# Patient Record
Sex: Female | Born: 1970
Health system: Southern US, Community
[De-identification: ages and names within clinical notes are randomized; demographics above are authoritative.]

## PROBLEM LIST (undated history)

## (undated) DIAGNOSIS — F17200 Nicotine dependence, unspecified, uncomplicated: Secondary | ICD-10-CM

## (undated) DIAGNOSIS — Z6841 Body Mass Index (BMI) 40.0 and over, adult: Secondary | ICD-10-CM

## (undated) DIAGNOSIS — Z8719 Personal history of other diseases of the digestive system: Secondary | ICD-10-CM

## (undated) DIAGNOSIS — I639 Cerebral infarction, unspecified: Secondary | ICD-10-CM

## (undated) DIAGNOSIS — Z9861 Coronary angioplasty status: Secondary | ICD-10-CM

## (undated) DIAGNOSIS — I214 Non-ST elevation (NSTEMI) myocardial infarction: Secondary | ICD-10-CM

## (undated) DIAGNOSIS — M1990 Unspecified osteoarthritis, unspecified site: Secondary | ICD-10-CM

## (undated) DIAGNOSIS — G4733 Obstructive sleep apnea (adult) (pediatric): Secondary | ICD-10-CM

## (undated) DIAGNOSIS — I10 Essential (primary) hypertension: Secondary | ICD-10-CM

## (undated) DIAGNOSIS — K219 Gastro-esophageal reflux disease without esophagitis: Secondary | ICD-10-CM

## (undated) DIAGNOSIS — Z955 Presence of coronary angioplasty implant and graft: Secondary | ICD-10-CM

## (undated) DIAGNOSIS — J45909 Unspecified asthma, uncomplicated: Secondary | ICD-10-CM

## (undated) DIAGNOSIS — E119 Type 2 diabetes mellitus without complications: Secondary | ICD-10-CM

## (undated) DIAGNOSIS — I2510 Atherosclerotic heart disease of native coronary artery without angina pectoris: Secondary | ICD-10-CM

## (undated) DIAGNOSIS — Z9989 Dependence on other enabling machines and devices: Secondary | ICD-10-CM

## (undated) HISTORY — DX: Presence of coronary angioplasty implant and graft: Z95.5

## (undated) HISTORY — PX: OTHER SURGICAL HISTORY: SHX169

## (undated) HISTORY — DX: Obstructive sleep apnea (adult) (pediatric): G47.33

## (undated) HISTORY — DX: Body Mass Index (BMI) 40.0 and over, adult: Z684

## (undated) HISTORY — DX: Atherosclerotic heart disease of native coronary artery without angina pectoris: I25.10

## (undated) HISTORY — DX: Dependence on other enabling machines and devices: Z99.89

## (undated) HISTORY — DX: Morbid (severe) obesity due to excess calories: E66.01

## (undated) HISTORY — DX: Coronary angioplasty status: Z98.61

---

## 1998-03-13 ENCOUNTER — Ambulatory Visit (HOSPITAL_COMMUNITY): Admission: RE | Admit: 1998-03-13 | Discharge: 1998-03-13 | Payer: Self-pay

## 1998-09-30 ENCOUNTER — Encounter: Payer: Self-pay | Admitting: Orthopedic Surgery

## 1998-09-30 ENCOUNTER — Ambulatory Visit (HOSPITAL_COMMUNITY): Admission: RE | Admit: 1998-09-30 | Discharge: 1998-09-30 | Payer: Self-pay | Admitting: Orthopedic Surgery

## 2008-04-27 HISTORY — PX: CARDIAC CATHETERIZATION: SHX172

## 2009-01-24 ENCOUNTER — Ambulatory Visit (HOSPITAL_COMMUNITY): Admission: RE | Admit: 2009-01-24 | Discharge: 2009-01-24 | Payer: Self-pay | Admitting: Cardiovascular Disease

## 2010-08-01 LAB — GLUCOSE, CAPILLARY
Glucose-Capillary: 142 mg/dL — ABNORMAL HIGH (ref 70–99)
Glucose-Capillary: 156 mg/dL — ABNORMAL HIGH (ref 70–99)

## 2010-08-01 LAB — HCG, SERUM, QUALITATIVE: Preg, Serum: NEGATIVE

## 2010-09-24 ENCOUNTER — Emergency Department (INDEPENDENT_AMBULATORY_CARE_PROVIDER_SITE_OTHER): Payer: Medicare Other

## 2010-09-24 ENCOUNTER — Emergency Department (HOSPITAL_BASED_OUTPATIENT_CLINIC_OR_DEPARTMENT_OTHER)
Admission: EM | Admit: 2010-09-24 | Discharge: 2010-09-24 | Disposition: A | Discharge status: Home / Self Care | Payer: Medicare Other | Attending: Emergency Medicine | Admitting: Emergency Medicine

## 2010-09-24 DIAGNOSIS — I10 Essential (primary) hypertension: Secondary | ICD-10-CM | POA: Insufficient documentation

## 2010-09-24 DIAGNOSIS — Z79899 Other long term (current) drug therapy: Secondary | ICD-10-CM | POA: Insufficient documentation

## 2010-09-24 DIAGNOSIS — R05 Cough: Secondary | ICD-10-CM

## 2010-09-24 DIAGNOSIS — R52 Pain, unspecified: Secondary | ICD-10-CM

## 2010-09-24 DIAGNOSIS — I2510 Atherosclerotic heart disease of native coronary artery without angina pectoris: Secondary | ICD-10-CM | POA: Insufficient documentation

## 2010-09-24 DIAGNOSIS — J069 Acute upper respiratory infection, unspecified: Secondary | ICD-10-CM | POA: Insufficient documentation

## 2010-09-24 DIAGNOSIS — E7800 Pure hypercholesterolemia, unspecified: Secondary | ICD-10-CM | POA: Insufficient documentation

## 2010-09-24 DIAGNOSIS — E119 Type 2 diabetes mellitus without complications: Secondary | ICD-10-CM | POA: Insufficient documentation

## 2010-09-24 DIAGNOSIS — Z8679 Personal history of other diseases of the circulatory system: Secondary | ICD-10-CM | POA: Insufficient documentation

## 2011-11-25 IMAGING — CR DG CHEST 2V
2 series · 2 of 2 positions shown · non-contrast
Comparison: None.

CLINICAL DATA: Cough/body aches

CHEST - 2 VIEW

[w chest pa]
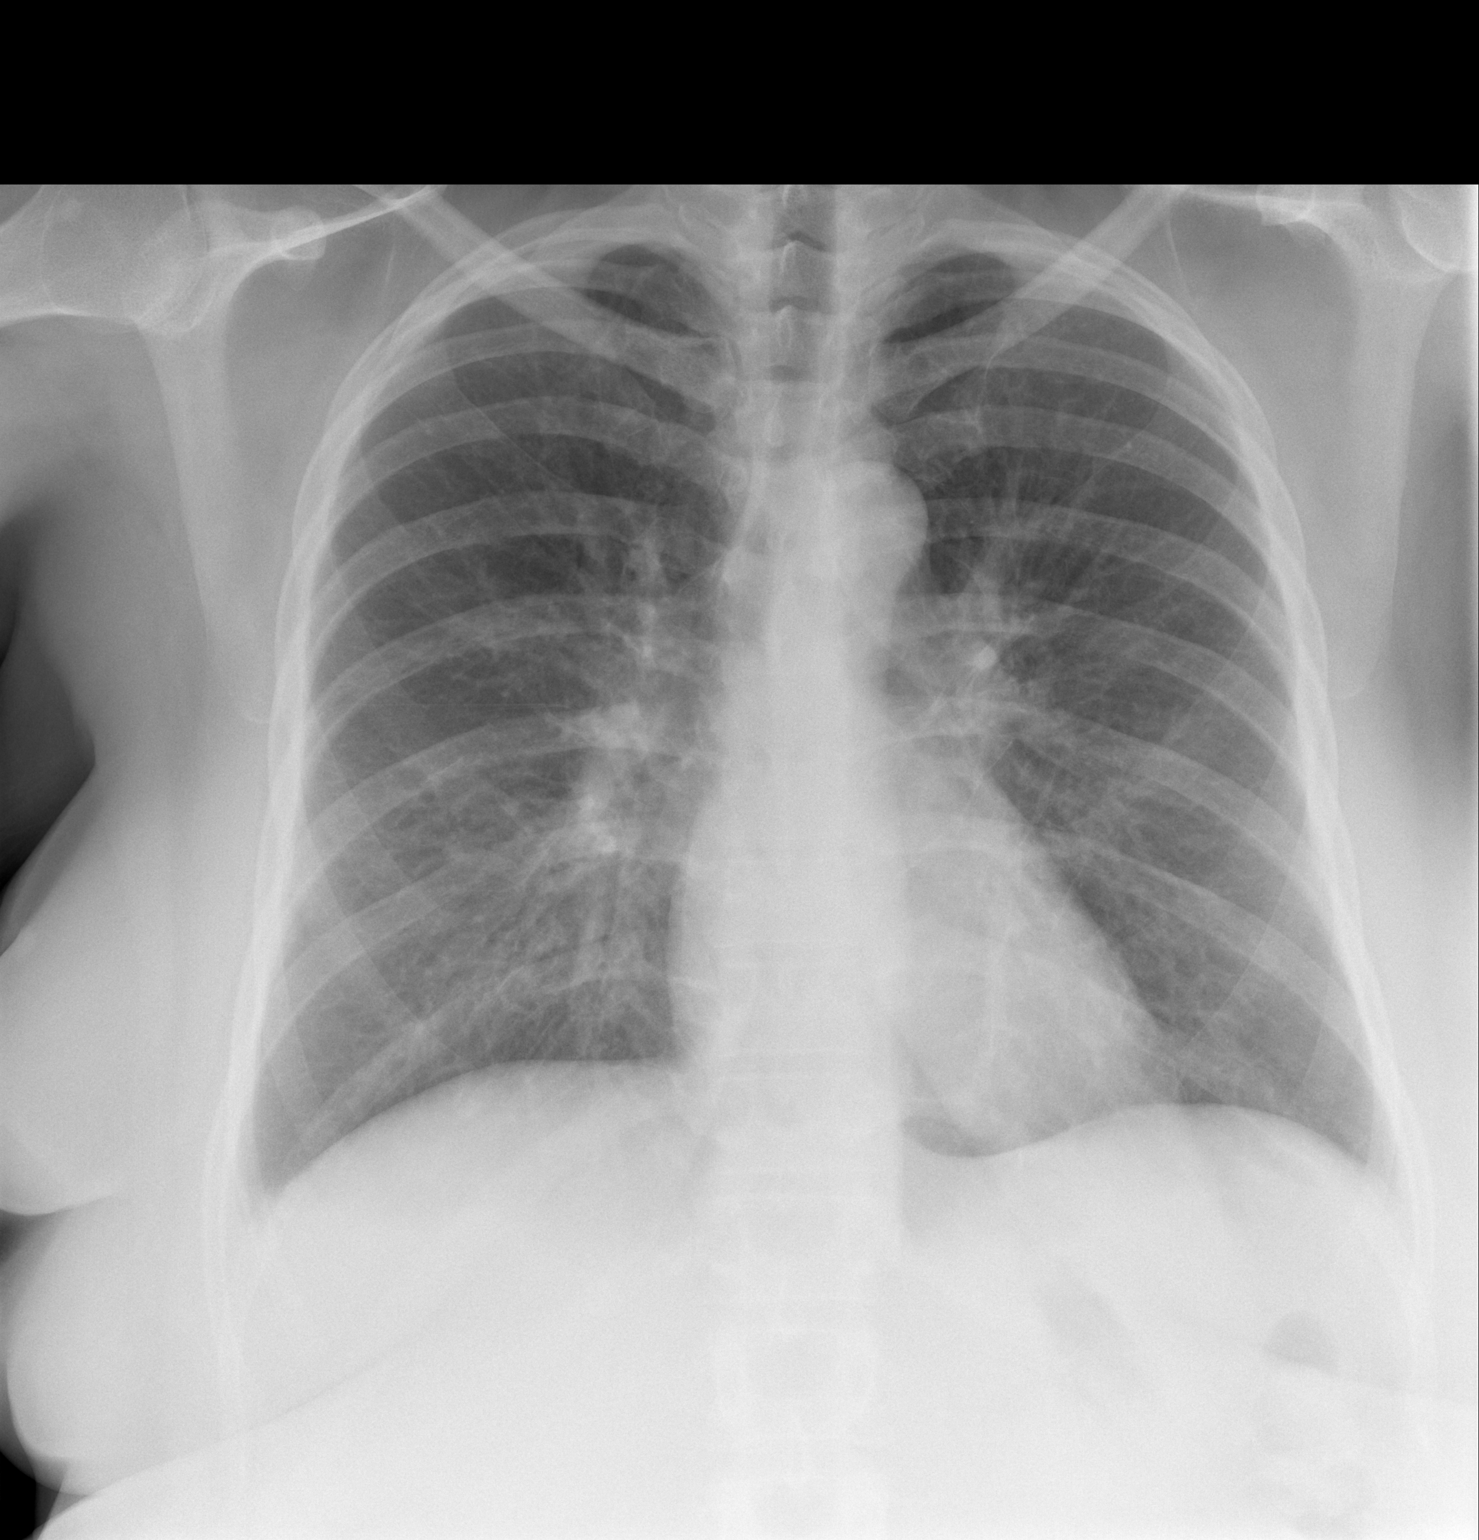

[w chest lat]
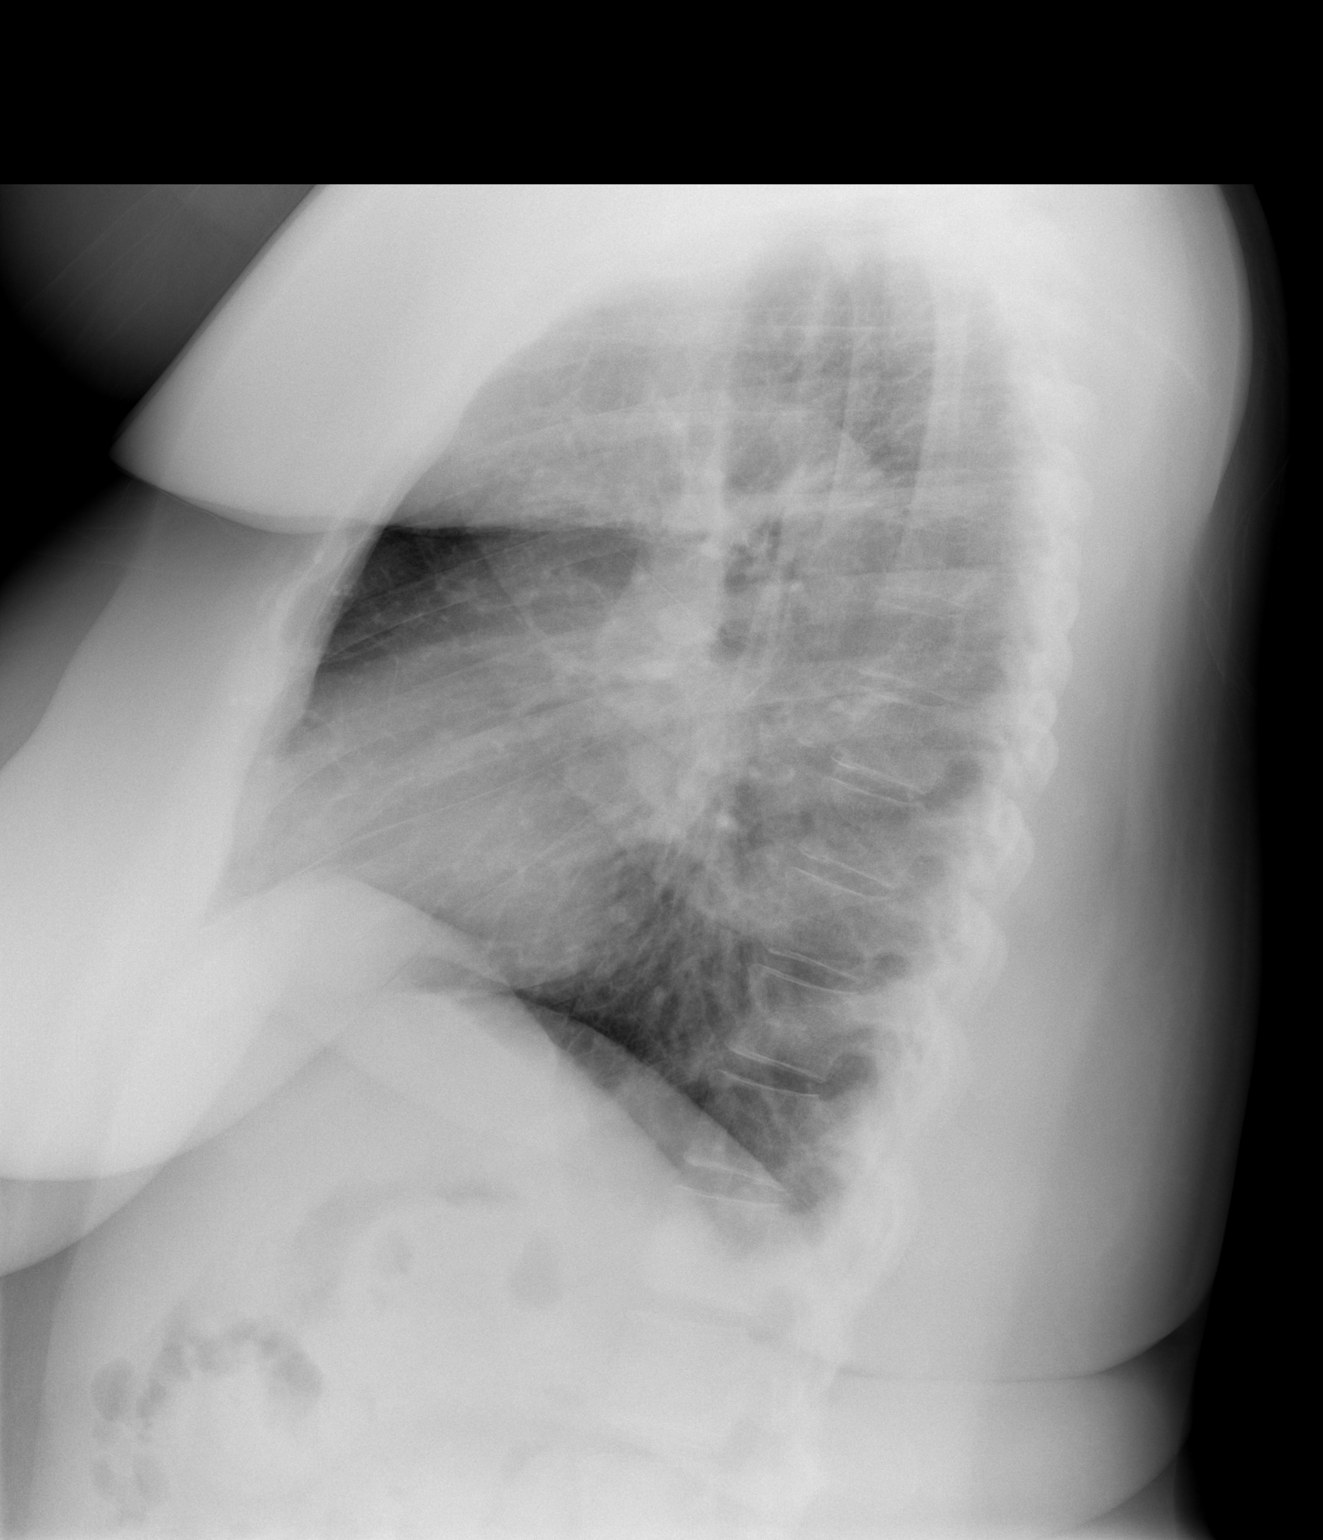

[2 of 2 positions shown; findings below may reference images not displayed]

FINDINGS: There is mild central airway thickening.  The lungs are
clear.  Heart and mediastinal contours normal.  No pleural fluid or
osseous lesions.
IMPRESSION: Chronic bronchitic type changes - no acute findings.

## 2012-06-25 DIAGNOSIS — I2510 Atherosclerotic heart disease of native coronary artery without angina pectoris: Secondary | ICD-10-CM

## 2012-06-25 HISTORY — DX: Atherosclerotic heart disease of native coronary artery without angina pectoris: I25.10

## 2012-07-04 ENCOUNTER — Encounter (HOSPITAL_COMMUNITY): Payer: Self-pay | Admitting: *Deleted

## 2012-07-04 ENCOUNTER — Emergency Department (HOSPITAL_COMMUNITY): Payer: Medicare Other

## 2012-07-04 ENCOUNTER — Inpatient Hospital Stay (HOSPITAL_COMMUNITY)
Admission: EM | Admit: 2012-07-04 | Discharge: 2012-07-07 | DRG: 281 | Disposition: A | Discharge status: Home / Self Care | Payer: Medicare Other | Attending: Cardiology | Admitting: Cardiology

## 2012-07-04 DIAGNOSIS — F17200 Nicotine dependence, unspecified, uncomplicated: Secondary | ICD-10-CM | POA: Diagnosis present

## 2012-07-04 DIAGNOSIS — I2510 Atherosclerotic heart disease of native coronary artery without angina pectoris: Secondary | ICD-10-CM | POA: Diagnosis present

## 2012-07-04 DIAGNOSIS — E876 Hypokalemia: Secondary | ICD-10-CM | POA: Diagnosis not present

## 2012-07-04 DIAGNOSIS — I10 Essential (primary) hypertension: Secondary | ICD-10-CM | POA: Diagnosis present

## 2012-07-04 DIAGNOSIS — E119 Type 2 diabetes mellitus without complications: Secondary | ICD-10-CM | POA: Diagnosis present

## 2012-07-04 DIAGNOSIS — Z6841 Body Mass Index (BMI) 40.0 and over, adult: Secondary | ICD-10-CM

## 2012-07-04 DIAGNOSIS — I214 Non-ST elevation (NSTEMI) myocardial infarction: Principal | ICD-10-CM | POA: Diagnosis present

## 2012-07-04 DIAGNOSIS — I249 Acute ischemic heart disease, unspecified: Secondary | ICD-10-CM

## 2012-07-04 DIAGNOSIS — Z794 Long term (current) use of insulin: Secondary | ICD-10-CM | POA: Diagnosis present

## 2012-07-04 DIAGNOSIS — Z8673 Personal history of transient ischemic attack (TIA), and cerebral infarction without residual deficits: Secondary | ICD-10-CM

## 2012-07-04 HISTORY — DX: Cerebral infarction, unspecified: I63.9

## 2012-07-04 HISTORY — DX: Nicotine dependence, unspecified, uncomplicated: F17.200

## 2012-07-04 LAB — POCT I-STAT TROPONIN I: Troponin i, poc: 0.8 ng/mL (ref 0.00–0.08)

## 2012-07-04 LAB — CBC WITH DIFFERENTIAL/PLATELET
Basophils Absolute: 0 10*3/uL (ref 0.0–0.1)
Basophils Relative: 0 % (ref 0–1)
Eosinophils Absolute: 0 10*3/uL (ref 0.0–0.7)
Eosinophils Relative: 0 % (ref 0–5)
HCT: 36.5 % (ref 36.0–46.0)
Hemoglobin: 12.7 g/dL (ref 12.0–15.0)
Lymphocytes Relative: 18 % (ref 12–46)
Lymphs Abs: 1.8 K/uL (ref 0.7–4.0)
MCH: 32.2 pg (ref 26.0–34.0)
MCHC: 34.8 g/dL (ref 30.0–36.0)
MCV: 92.6 fL (ref 78.0–100.0)
Monocytes Absolute: 0.8 K/uL (ref 0.1–1.0)
Monocytes Relative: 8 % (ref 3–12)
Neutro Abs: 7.3 10*3/uL (ref 1.7–7.7)
Neutrophils Relative %: 73 % (ref 43–77)
Platelets: 284 10*3/uL (ref 150–400)
RBC: 3.94 MIL/uL (ref 3.87–5.11)
RDW: 13.5 % (ref 11.5–15.5)
WBC: 9.9 K/uL (ref 4.0–10.5)

## 2012-07-04 LAB — COMPREHENSIVE METABOLIC PANEL
AST: 20 U/L (ref 0–37)
Albumin: 3.4 g/dL — ABNORMAL LOW (ref 3.5–5.2)
Alkaline Phosphatase: 115 U/L (ref 39–117)
BUN: 13 mg/dL (ref 6–23)
Chloride: 101 mEq/L (ref 96–112)
Potassium: 4.4 mEq/L (ref 3.5–5.1)
Sodium: 137 mEq/L (ref 135–145)
Total Protein: 7.1 g/dL (ref 6.0–8.3)

## 2012-07-04 LAB — PROTIME-INR
INR: 1.04 (ref 0.00–1.49)
Prothrombin Time: 13.5 s (ref 11.6–15.2)

## 2012-07-04 LAB — COMPREHENSIVE METABOLIC PANEL WITH GFR
ALT: 16 U/L (ref 0–35)
CO2: 24 meq/L (ref 19–32)
Calcium: 9 mg/dL (ref 8.4–10.5)
Creatinine, Ser: 0.96 mg/dL (ref 0.50–1.10)
GFR calc Af Amer: 84 mL/min — ABNORMAL LOW (ref >90)
GFR calc non Af Amer: 72 mL/min — ABNORMAL LOW (ref >90)
Glucose, Bld: 128 mg/dL — ABNORMAL HIGH (ref 70–99)
Total Bilirubin: 0.5 mg/dL (ref 0.3–1.2)

## 2012-07-04 LAB — GLUCOSE, CAPILLARY: Glucose-Capillary: 212 mg/dL — ABNORMAL HIGH (ref 70–99)

## 2012-07-04 LAB — TROPONIN I: Troponin I: 2.08 ng/mL (ref <0.30)

## 2012-07-04 LAB — LIPASE, BLOOD: Lipase: 28 U/L (ref 11–59)

## 2012-07-04 LAB — APTT: aPTT: 29 seconds (ref 24–37)

## 2012-07-04 MED ORDER — SODIUM CHLORIDE 0.9 % IV SOLN
Freq: Once | INTRAVENOUS | Status: AC
  Administered 2012-07-04: 75 mL/h via INTRAVENOUS

## 2012-07-04 MED ORDER — SODIUM CHLORIDE 0.9 % IV SOLN
250.0000 mL | INTRAVENOUS | Status: DC | PRN
  Administered 2012-07-05: 250 mL via INTRAVENOUS

## 2012-07-04 MED ORDER — ASPIRIN EC 81 MG PO TBEC
81.0000 mg | DELAYED_RELEASE_TABLET | Freq: Every day | ORAL | Status: DC
  Administered 2012-07-06 – 2012-07-07 (×2): 81 mg via ORAL
  Filled 2012-07-04 (×3): qty 1

## 2012-07-04 MED ORDER — ASPIRIN 81 MG PO CHEW
162.0000 mg | CHEWABLE_TABLET | Freq: Once | ORAL | Status: AC
  Administered 2012-07-04: 162 mg via ORAL
  Filled 2012-07-04: qty 2

## 2012-07-04 MED ORDER — ASPIRIN 81 MG PO TABS: 81.0000 mg | ORAL_TABLET | Freq: Every day | ORAL | Status: DC

## 2012-07-04 MED ORDER — SODIUM CHLORIDE 0.9 % IV SOLN: INTRAVENOUS | Status: AC

## 2012-07-04 MED ORDER — NITROGLYCERIN 0.4 MG SL SUBL: 0.4000 mg | SUBLINGUAL_TABLET | SUBLINGUAL | Status: DC | PRN

## 2012-07-04 MED ORDER — CYCLOBENZAPRINE HCL 10 MG PO TABS
10.0000 mg | ORAL_TABLET | Freq: Three times a day (TID) | ORAL | Status: DC | PRN
  Administered 2012-07-05 (×2): 10 mg via ORAL
  Filled 2012-07-04 (×2): qty 1

## 2012-07-04 MED ORDER — ASPIRIN-DIPYRIDAMOLE ER 25-200 MG PO CP12
1.0000 | ORAL_CAPSULE | Freq: Every day | ORAL | Status: DC
  Administered 2012-07-05 – 2012-07-07 (×3): 1 via ORAL
  Filled 2012-07-04 (×3): qty 1

## 2012-07-04 MED ORDER — EPINEPHRINE 0.3 MG/0.3ML IJ DEVI
0.3000 mg | INTRAMUSCULAR | Status: DC | PRN
  Filled 2012-07-04: qty 0.6

## 2012-07-04 MED ORDER — ASPIRIN 81 MG PO CHEW
81.0000 mg | CHEWABLE_TABLET | Freq: Every day | ORAL | Status: DC
  Administered 2012-07-06 (×2): 81 mg via ORAL
  Filled 2012-07-04: qty 1

## 2012-07-04 MED ORDER — EZETIMIBE 10 MG PO TABS
10.0000 mg | ORAL_TABLET | Freq: Every day | ORAL | Status: DC
  Administered 2012-07-05 – 2012-07-07 (×3): 10 mg via ORAL
  Filled 2012-07-04 (×3): qty 1

## 2012-07-04 MED ORDER — FUROSEMIDE 40 MG PO TABS
40.0000 mg | ORAL_TABLET | Freq: Every day | ORAL | Status: DC
  Administered 2012-07-06 – 2012-07-07 (×2): 40 mg via ORAL
  Filled 2012-07-04 (×3): qty 1

## 2012-07-04 MED ORDER — SODIUM CHLORIDE 0.9 % IJ SOLN: 3.0000 mL | INTRAMUSCULAR | Status: DC | PRN

## 2012-07-04 MED ORDER — EPINEPHRINE 0.3 MG/0.3ML IJ DEVI: 0.3000 mg | Freq: Once | INTRAMUSCULAR | Status: DC

## 2012-07-04 MED ORDER — SODIUM CHLORIDE 0.9 % IJ SOLN
3.0000 mL | Freq: Two times a day (BID) | INTRAMUSCULAR | Status: DC
  Administered 2012-07-04 – 2012-07-05 (×2): 3 mL via INTRAVENOUS

## 2012-07-04 MED ORDER — NITROGLYCERIN 0.4 MG/SPRAY TL SOLN: 1.0000 | Status: DC | PRN

## 2012-07-04 MED ORDER — LISINOPRIL 40 MG PO TABS
40.0000 mg | ORAL_TABLET | Freq: Every day | ORAL | Status: DC
  Administered 2012-07-06 – 2012-07-07 (×2): 40 mg via ORAL
  Filled 2012-07-04 (×3): qty 1

## 2012-07-04 MED ORDER — HEPARIN BOLUS VIA INFUSION
4000.0000 [IU] | Freq: Once | INTRAVENOUS | Status: AC
  Administered 2012-07-04: 4000 [IU] via INTRAVENOUS

## 2012-07-04 MED ORDER — HEPARIN (PORCINE) IN NACL 100-0.45 UNIT/ML-% IJ SOLN
1550.0000 [IU]/h | INTRAMUSCULAR | Status: DC
  Administered 2012-07-04: 1250 [IU]/h via INTRAVENOUS
  Filled 2012-07-04 (×3): qty 250

## 2012-07-04 MED ORDER — ALPRAZOLAM 0.25 MG PO TABS
0.2500 mg | ORAL_TABLET | Freq: Two times a day (BID) | ORAL | Status: DC | PRN
  Administered 2012-07-05: 0.25 mg via ORAL
  Filled 2012-07-04: qty 1

## 2012-07-04 MED ORDER — DARIFENACIN HYDROBROMIDE ER 7.5 MG PO TB24
7.5000 mg | ORAL_TABLET | Freq: Every day | ORAL | Status: DC
  Administered 2012-07-05 – 2012-07-07 (×3): 7.5 mg via ORAL
  Filled 2012-07-04 (×3): qty 1

## 2012-07-04 MED ORDER — ONDANSETRON HCL 4 MG/2ML IJ SOLN: 4.0000 mg | Freq: Four times a day (QID) | INTRAMUSCULAR | Status: DC | PRN

## 2012-07-04 MED ORDER — TRAMADOL HCL 50 MG PO TABS
50.0000 mg | ORAL_TABLET | Freq: Four times a day (QID) | ORAL | Status: DC | PRN
  Administered 2012-07-05 – 2012-07-06 (×2): 50 mg via ORAL
  Filled 2012-07-04 (×3): qty 1

## 2012-07-04 MED ORDER — ACETAMINOPHEN 325 MG PO TABS
650.0000 mg | ORAL_TABLET | ORAL | Status: DC | PRN
  Administered 2012-07-06: 650 mg via ORAL
  Filled 2012-07-04: qty 2

## 2012-07-04 MED ORDER — ASPIRIN 81 MG PO CHEW
324.0000 mg | CHEWABLE_TABLET | ORAL | Status: AC
  Administered 2012-07-05: 324 mg via ORAL
  Filled 2012-07-04: qty 4

## 2012-07-04 MED ORDER — ALBUTEROL SULFATE HFA 108 (90 BASE) MCG/ACT IN AERS: 2.0000 | INHALATION_SPRAY | Freq: Four times a day (QID) | RESPIRATORY_TRACT | Status: DC | PRN

## 2012-07-04 MED ORDER — METOPROLOL TARTRATE 50 MG PO TABS
50.0000 mg | ORAL_TABLET | Freq: Two times a day (BID) | ORAL | Status: DC
  Administered 2012-07-05 – 2012-07-07 (×6): 50 mg via ORAL
  Filled 2012-07-04 (×7): qty 1

## 2012-07-04 MED ORDER — ATORVASTATIN CALCIUM 40 MG PO TABS
40.0000 mg | ORAL_TABLET | Freq: Every day | ORAL | Status: DC
  Administered 2012-07-05 – 2012-07-06 (×2): 40 mg via ORAL
  Filled 2012-07-04 (×3): qty 1

## 2012-07-04 MED ORDER — ZOLPIDEM TARTRATE 5 MG PO TABS: 5.0000 mg | ORAL_TABLET | Freq: Every evening | ORAL | Status: DC | PRN

## 2012-07-04 NOTE — ED Notes (Signed)
Pt in from PCP@ [facility], pt c/o epigastric pain radiating to R shoulder, pt was getting pupils dilated for eval at eye MD & was in the waiting room & started to have N/V x 5 vomiting episodes, pt c/o diaphoresis with episode, pt then went to PCP & was sent here for further eval, pt received x 1 nitro @ PCP office with relief, no abnormal EKG findings in route, pt hx of Stroke with stated R hand deficits at baseline, pt A&O x4, follows commands, speaks in complete sentences

## 2012-07-04 NOTE — ED Provider Notes (Addendum)
History     CSN: [id number]  Arrival date & time 07/04/12  1258   First MD Initiated Contact with Patient 07/04/12 1313      No chief complaint on file.   (Consider location/radiation/quality/duration/timing/severity/associated sxs/prior treatment) HPI Comments: Patient is brought by EMS from her primary care physician, Dr. Julio Sicks.  Patient reports that she was in her usual state of health, was at her eye doctor for her yearly evaluation. She did not eaten breakfast, had taken off her usual morning medications. She had her eyes dilated and were waiting for them to dilate to see her physician. She suddenly became nauseated without any pain and had to go the restroom and reported several episodes of dry heaving. Following this she began having some chest pressure and discomfort that radiated to her right shoulder. She became diaphoretic, denies shortness of breath or feeling syncopal. She was sent over to her primary care physician and received a single sublingual nitroglycerin and reported significant improvement of her symptoms. EMS transported her without any significant incident. She has a prior history of coronary disease. In review of prior records, it appears that she had a heart cath in September, 2010 which revealed a nearly occluded proximal right coronary artery. She developed several collaterals and because she was not having active pain, decision was made at the time to treat her medically. She does have a history of hypercholesterolemia, hypertension, DM. She also has a former history of stroke. She currently is on Aggrenox. She also takes a baby aspirin. She has not yet had a aspirin today. She also reports that she has a history of hiatal hernia.    The history is provided by the patient, medical records and the EMS personnel.    No past medical history on file.  No past surgical history on file.  No family history on file.  History  Substance Use Topics  . Smoking status:  Not on file  . Smokeless tobacco: Not on file  . Alcohol Use: Not on file    OB History   No data available      Review of Systems  Constitutional: Positive for diaphoresis. Negative for fever and chills.  Eyes: Negative for visual disturbance.  Respiratory: Positive for chest tightness. Negative for shortness of breath.   Cardiovascular: Positive for chest pain. Negative for palpitations and leg swelling.  Gastrointestinal: Positive for nausea and vomiting. Negative for abdominal pain.  Genitourinary: Negative for flank pain.  Musculoskeletal: Positive for back pain.  Neurological: Negative for dizziness, seizures, syncope, speech difficulty and light-headedness.  All other systems reviewed and are negative.    Allergies  Ciprofloxacin  Home Medications   Current Outpatient Rx  Name  Route  Sig  Dispense  Refill  . albuterol (PROVENTIL HFA;VENTOLIN HFA) 108 (90 BASE) MCG/ACT inhaler   Inhalation   Inhale 2 puffs into the lungs every 6 (six) hours as needed for wheezing.         Marland Kitchen aspirin 81 MG tablet   Oral   Take 81 mg by mouth daily.         Marland Kitchen atorvastatin (LIPITOR) 40 MG tablet   Oral   Take 40 mg by mouth daily.         . cyclobenzaprine (FLEXERIL) 10 MG tablet   Oral   Take 10 mg by mouth 3 (three) times daily as needed for muscle spasms.         Marland Kitchen dipyridamole-aspirin (AGGRENOX) 200-25 MG per 12 hr capsule  Oral   Take 1 capsule by mouth daily.         Marland Kitchen EPINEPHrine (EPIPEN) 0.3 mg/0.3 mL DEVI   Intramuscular   Inject 0.3 mg into the muscle once.         . ezetimibe (ZETIA) 10 MG tablet   Oral   Take 10 mg by mouth daily.         . furosemide (LASIX) 40 MG tablet   Oral   Take 40 mg by mouth daily.         Marland Kitchen lisinopril (PRINIVIL,ZESTRIL) 40 MG tablet   Oral   Take 40 mg by mouth daily.         . metoprolol (LOPRESSOR) 50 MG tablet   Oral   Take 50 mg by mouth 2 (two) times daily.         . nitroGLYCERIN (NITROLINGUAL)  0.4 MG/SPRAY spray   Sublingual   Place 1 spray under the tongue every 5 (five) minutes as needed for chest pain.         . sitaGLIPtan-metformin (JANUMET) 50-500 MG per tablet   Oral   Take 1 tablet by mouth 2 (two) times daily with a meal.         . solifenacin (VESICARE) 10 MG tablet   Oral   Take 10 mg by mouth daily as needed.         . traMADol (ULTRAM) 50 MG tablet   Oral   Take 50 mg by mouth every 6 (six) hours as needed for pain.           BP 161/83  Pulse 71  Temp(Src) 98.1 F (36.7 C) (Oral)  Resp 27  SpO2 100%  Physical Exam  Nursing note and vitals reviewed. Constitutional: She is oriented to person, place, and time. She appears well-developed and well-nourished. No distress.  HENT:  Head: Normocephalic and atraumatic.  Eyes: Conjunctivae and EOM are normal. No scleral icterus.  Neck: Normal range of motion. Neck supple. No JVD present.  Cardiovascular: Normal rate and regular rhythm.   No murmur heard. Pulmonary/Chest: Effort normal. No respiratory distress. She has no wheezes. She has no rales.  Abdominal: Soft. She exhibits no distension. There is no tenderness. There is no rebound and no guarding.  Neurological: She is alert and oriented to person, place, and time. No cranial nerve deficit. Coordination normal.  Skin: Skin is warm and dry. No rash noted. She is not diaphoretic.  Psychiatric: She has a normal mood and affect.    ED Course  Procedures (including critical care time)  CRITICAL CARE Performed by: Lear Ng.   Total critical care time: 30 min  Critical care time was exclusive of separately billable procedures and treating other patients.  Critical care was necessary to treat or prevent imminent or life-threatening deterioration.  Critical care was time spent personally by me on the following activities: development of treatment plan with patient and/or surrogate as well as nursing, discussions with consultants, evaluation  of patient's response to treatment, examination of patient, obtaining history from patient or surrogate, ordering and performing treatments and interventions, ordering and review of laboratory studies, ordering and review of radiographic studies, pulse oximetry and re-evaluation of patient's condition.   Labs Reviewed  COMPREHENSIVE METABOLIC PANEL - Abnormal; Notable for the following:    Glucose, Bld 128 (*)    Albumin 3.4 (*)    GFR calc non Af Amer 72 (*)    GFR calc Af Amer 84 (*)  All other components within normal limits  POCT I-STAT TROPONIN I - Abnormal; Notable for the following:    Troponin i, poc 0.80 (*)    All other components within normal limits  CBC WITH DIFFERENTIAL  LIPASE, BLOOD  TROPONIN I  APTT  PROTIME-INR   Dg Chest 2 View  07/04/2012  *RADIOLOGY REPORT*  Clinical Data: Chest pain.  Hypertension.  CHEST - 2 VIEW  Comparison: 09/24/2010.  Findings: Normal sized heart.  Clear lungs with normal vascularity. Normal appearing bones.  IMPRESSION: Normal examination.   Original Report Authenticated By: Beckie Salts, M.D.      1. Acute coronary syndrome     Room air saturation is 100% I interpret this to be normal.   EKG at time 13:08, shows sinus rhythm at a rate of 70. Possible right atrial enlargement. Normal axis. No ST or T-wave abnormalities.  4:31 PM Pt's CP continues to be resolved after the 1 SL NTG.  Pt is updated that troponin is +, likely small NSTEMI.  Will start IV heparin and discuss with [facility] for admission.    MDM  Patient's symptoms are atypical for coronary syndrome given that she presented initially with nausea and vomiting followed by chest pressure discomfort. Pain seemed improved after nitroglycerin which would suggest possibly cardiac angina versus esophageal spasms. Given the pain occurred shortly after multiple episodes of dry heaving, I would favor more of an esophageal issue. However she certainly has significant cardiac risk factors with  DM and she is female meaning she may have atypical symptoms. Plan is to monitor the patient, round serial cardiac enzymes and discuss with her primary cardiologist with [facility] heart and vascular.  Aspirin given here.        Gavin Pound. Oletta Lamas, MD 07/04/12 2130  Gavin Pound. Oletta Lamas, MD 07/04/12 [phone]

## 2012-07-04 NOTE — Consult Note (Signed)
Reason for Consult:Chest Pain Referring Physician: Indios MD   HPI: The patient is a 42 y.o. female who is morbidly obese with a history of CAD, DM, stroke as well as hypertension. She has been followed by Dr. Herbie Baltimore.  She had a cath in 2010 that revealed a nearly complete blocked RCA, however she had adequate supply of collaterals and was treated medically. She last saw Dr.Harding in clinic on 04/19/2012 and at that time was doing well from a cardiovascular standpoint. She presented to the [facility] ED today with a complaint of substernal chest pressure. She has never had this type of pain before. She was at the eye doctor's office when she developed nausea with vomiting. She was diaphoretic and developed chest pressure which radiated to her right shoulder. She had mild SOB. The pain was non-pleuritic, non-positional, non-exertional and 6/10. She left the eye doctor and when to her PCP office, which was across the street.  There she was given SL NTG x 1 and had immediate relief. She denies any further chest pain or related symptoms. She denies recent orthopnea/PND, syncope/presyncope. She reports compliance with all of her medications.   No past medical history on file.  No past surgical history on file.  No family history on file.  Social History:  has no tobacco, alcohol, and drug history on file.  Allergies:  Allergies  Allergen Reactions  . Ciprofloxacin Hives and Swelling    Medications: Prior to Admission:  (Not in a hospital admission)  Results for orders placed during the hospital encounter of 07/04/12 (from the past 48 hour(s))  CBC WITH DIFFERENTIAL     Status: None   Collection Time    07/04/12  3:07 PM      Result Value Range   WBC 9.9  4.0 - 10.5 K/uL   RBC 3.94  3.87 - 5.11 MIL/uL   Hemoglobin 12.7  12.0 - 15.0 g/dL   HCT 86.5  78.4 - 69.6 %   MCV 92.6  78.0 - 100.0 fL   MCH 32.2  26.0 - 34.0 pg   MCHC 34.8  30.0 - 36.0 g/dL   RDW 29.5  28.4 - 13.2 %   Platelets 284   150 - 400 K/uL   Neutrophils Relative 73  43 - 77 %   Neutro Abs 7.3  1.7 - 7.7 K/uL   Lymphocytes Relative 18  12 - 46 %   Lymphs Abs 1.8  0.7 - 4.0 K/uL   Monocytes Relative 8  3 - 12 %   Monocytes Absolute 0.8  0.1 - 1.0 K/uL   Eosinophils Relative 0  0 - 5 %   Eosinophils Absolute 0.0  0.0 - 0.7 K/uL   Basophils Relative 0  0 - 1 %   Basophils Absolute 0.0  0.0 - 0.1 K/uL  COMPREHENSIVE METABOLIC PANEL     Status: Abnormal   Collection Time    07/04/12  3:07 PM      Result Value Range   Sodium 137  135 - 145 mEq/L   Potassium 4.4  3.5 - 5.1 mEq/L   Chloride 101  96 - 112 mEq/L   CO2 24  19 - 32 mEq/L   Glucose, Bld 128 (*) 70 - 99 mg/dL   BUN 13  6 - 23 mg/dL   Creatinine, Ser 4.40  0.50 - 1.10 mg/dL   Calcium 9.0  8.4 - 10.2 mg/dL   Total Protein 7.1  6.0 - 8.3 g/dL   Albumin  3.4 (*) 3.5 - 5.2 g/dL   AST 20  0 - 37 U/L   ALT 16  0 - 35 U/L   Alkaline Phosphatase 115  39 - 117 U/L   Total Bilirubin 0.5  0.3 - 1.2 mg/dL   GFR calc non Af Amer 72 (*) >90 mL/min   GFR calc Af Amer 84 (*) >90 mL/min   Comment:            The eGFR has been calculated     using the CKD EPI equation.     This calculation has not been     validated in all clinical     situations.     eGFR's persistently     <90 mL/min signify     possible Chronic Kidney Disease.  LIPASE, BLOOD     Status: None   Collection Time    07/04/12  3:07 PM      Result Value Range   Lipase 28  11 - 59 U/L  POCT I-STAT TROPONIN I     Status: Abnormal   Collection Time    07/04/12  3:45 PM      Result Value Range   Troponin i, poc 0.80 (*) 0.00 - 0.08 ng/mL   Comment NOTIFIED PHYSICIAN     Comment 3            Comment: Due to the release kinetics of cTnI,     a negative result within the first hours     of the onset of symptoms does not rule out     myocardial infarction with certainty.     If myocardial infarction is still suspected,     repeat the test at appropriate intervals.  TROPONIN I     Status:  Abnormal   Collection Time    07/04/12  4:01 PM      Result Value Range   Troponin I 2.08 (*) <0.30 ng/mL   Comment:            Due to the release kinetics of cTnI,     a negative result within the first hours     of the onset of symptoms does not rule out     myocardial infarction with certainty.     If myocardial infarction is still suspected,     repeat the test at appropriate intervals.     CRITICAL RESULT CALLED TO, READ BACK BY AND VERIFIED WITH:     W.SHEERWOOD RN 1705 07/04/12 E.GADDY  APTT     Status: None   Collection Time    07/04/12  4:20 PM      Result Value Range   aPTT 29  24 - 37 seconds  PROTIME-INR     Status: None   Collection Time    07/04/12  4:20 PM      Result Value Range   Prothrombin Time 13.5  11.6 - 15.2 seconds   INR 1.04  0.00 - 1.49    Dg Chest 2 View  07/04/2012  *RADIOLOGY REPORT*  Clinical Data: Chest pain.  Hypertension.  CHEST - 2 VIEW  Comparison: 09/24/2010.  Findings: Normal sized heart.  Clear lungs with normal vascularity. Normal appearing bones.  IMPRESSION: Normal examination.   Original Report Authenticated By: Beckie Salts, M.D.     Review of Systems  Constitutional: Positive for diaphoresis. Negative for malaise/fatigue.  HENT: Negative for congestion, sore throat and neck pain.   Respiratory: Positive for shortness of breath. Negative for cough,  sputum production and wheezing.   Cardiovascular: Positive for chest pain. Negative for palpitations, orthopnea, claudication, leg swelling and PND.  Gastrointestinal: Positive for nausea and vomiting. Negative for abdominal pain, diarrhea, constipation, blood in stool and melena.  Genitourinary: Negative for hematuria.  Musculoskeletal: Negative for back pain.  Neurological: Negative for dizziness, loss of consciousness and weakness.   Blood pressure 137/92, pulse 77, temperature 98.1 F (36.7 C), temperature source Oral, resp. rate 23, height 5\' 6"  (1.676 m), weight 280 lb (127.007 kg),  SpO2 100.00%. Physical Exam  Constitutional: She is oriented to person, place, and time. She appears well-developed and well-nourished. No distress.  HENT:  Head: Normocephalic and atraumatic.  Eyes: Conjunctivae and EOM are normal. Pupils are equal, round, and reactive to light.  Neck: Normal range of motion. Neck supple. No JVD present. No tracheal deviation present. No thyromegaly present.  Cardiovascular: Normal rate, regular rhythm, normal heart sounds and intact distal pulses.  Exam reveals no gallop and no friction rub.   No murmur heard. Pulses:      Radial pulses are 2+ on the right side, and 2+ on the left side.       Dorsalis pedis pulses are 2+ on the right side, and 2+ on the left side.  Respiratory: Effort normal and breath sounds normal. No respiratory distress. She has no wheezes. She has no rales. She exhibits no tenderness.  GI: Soft. Bowel sounds are normal. She exhibits no distension and no mass. There is no tenderness.  Musculoskeletal: She exhibits edema. Tenderness: trace bilateral LEE.  Lymphadenopathy:    She has no cervical adenopathy.  Neurological: She is alert and oriented to person, place, and time.  Skin: Skin is warm and dry. She is not diaphoretic.  Psychiatric: She has a normal mood and affect. Her behavior is normal.    Assessment/Plan: Principal Problem:   NSTEMI (non-ST elevated myocardial infarction) Active Problems:   CAD (coronary artery disease)   DM (diabetes mellitus)   HTN (hypertension)   Obesity  Plan: Will admit for NSTEMI. Initial troponin is 2.08. She is on IV heparin. Will make NPO at midnight and plan for cath tomorrow. Will need to  hold Metformin, ACE-I and Lasix tomorrow morning. Cr is stable at 0.96. INR is 1.04.  BP and HR both stable. SpO2 is 100% on RA. She is already on a BB and statin.  Allayne Butcher, PA-C 07/04/2012, 6:26 PM

## 2012-07-04 NOTE — Consult Note (Signed)
Pt. Seen and examined. Agree with the NP/PA-C note as written.   42 yo female with DM2, obesity, HTN, known CAD with occluded RCA with collaterals in 2010. She had convincing cardiac chest pain today in her eye doctor's office, she was nauseated and had diaphoresis.  She was sent across the street to her PCP (Dr. Julio Sicks) who gave her a nitroglycerin that relieved her pain.  She eventually came to the ER.  Her first troponin was positive at 2.08. EKG may show subtle ST changes in the anterolateral leads, but no T wave changes. She is currently chest pain free.  I discussed options with her and recommend LHC tomorrow. She is agreeable - will arrange with Dr. Tresa Endo due to higher probability of PCI.  [name], MD, [facility] Attending Cardiologist [facility]

## 2012-07-04 NOTE — ED Notes (Signed)
Sharol Harness, [place] paged re: CBG, awaiting return call

## 2012-07-04 NOTE — ED Notes (Signed)
Ghim, MD notified re: I stat troponin

## 2012-07-04 NOTE — ED Notes (Signed)
Spoke with phlebotomy re: lab draw, awaiting blood to be drawn for analysis

## 2012-07-04 NOTE — Progress Notes (Signed)
ANTICOAGULATION CONSULT NOTE - Initial Consult  Pharmacy Consult for UFH Indication: NSTEMI  Allergies  Allergen Reactions  . Ciprofloxacin Hives and Swelling    Patient Measurements: Height: 5\' 6"  (167.6 cm) Weight: 280 lb (127.007 kg) IBW/kg (Calculated) : 59.3 Heparin Dosing Weight: 90kg  Vital Signs: Temp: 98.1 F (36.7 C) (03/10 1311) Temp src: Oral (03/10 1311) BP: 137/92 mmHg (03/10 1630) Pulse Rate: 77 (03/10 1630)  Labs:  Recent Labs  07/04/12 1507 07/04/12 1620  HGB 12.7  --   HCT 36.5  --   PLT 284  --   APTT  --  29  LABPROT  --  13.5  INR  --  1.04  CREATININE 0.96  --     Estimated Creatinine Clearance: 105.2 ml/min (by C-G formula based on Cr of 0.96).   Medical History: No past medical history on file.  Medications:   (Not in a hospital admission)  Assessment: 42 y/o female patient admitted with n/v, chest pain and positive cardiac enzymes requiring anticoagulation for NSTEMI.  Goal of Therapy:  Heparin level 0.3-0.7 units/ml Monitor platelets by anticoagulation protocol: Yes   Plan:  Heparin 4000 unit IV bolus followed by infusion at 1250 units/hr. Check 6 hour heparin level with daily cbc and heparin level.  Verlene Mayer, PharmD, BCPS Pager [phone] 07/04/2012,5:02 PM

## 2012-07-05 ENCOUNTER — Encounter (HOSPITAL_COMMUNITY)
Admission: EM | Disposition: A | Discharge status: Home / Self Care | Payer: Self-pay | Source: Home / Self Care | Attending: Cardiology

## 2012-07-05 HISTORY — PX: CARDIAC CATHETERIZATION: SHX172

## 2012-07-05 HISTORY — PX: LEFT HEART CATHETERIZATION WITH CORONARY ANGIOGRAM: SHX5451

## 2012-07-05 LAB — LIPID PANEL
Cholesterol: 115 mg/dL (ref 0–200)
LDL Cholesterol: 53 mg/dL (ref 0–99)
VLDL: 19 mg/dL (ref 0–40)

## 2012-07-05 LAB — POCT ACTIVATED CLOTTING TIME: Activated Clotting Time: 127 seconds

## 2012-07-05 LAB — CBC
Platelets: 271 10*3/uL (ref 150–400)
RBC: 3.56 MIL/uL — ABNORMAL LOW (ref 3.87–5.11)
WBC: 7 10*3/uL (ref 4.0–10.5)

## 2012-07-05 LAB — HEPARIN LEVEL (UNFRACTIONATED)
Heparin Unfractionated: 0.11 IU/mL — ABNORMAL LOW (ref 0.30–0.70)
Heparin Unfractionated: 0.94 [IU]/mL — ABNORMAL HIGH (ref 0.30–0.70)

## 2012-07-05 LAB — TROPONIN I: Troponin I: 2.21 ng/mL (ref <0.30)

## 2012-07-05 LAB — BASIC METABOLIC PANEL
Calcium: 8.6 mg/dL (ref 8.4–10.5)
GFR calc Af Amer: >90 mL/min (ref >90)
GFR calc non Af Amer: 85 mL/min — ABNORMAL LOW (ref >90)
Potassium: 3.6 mEq/L (ref 3.5–5.1)
Sodium: 135 mEq/L (ref 135–145)

## 2012-07-05 LAB — MRSA PCR SCREENING: MRSA by PCR: NEGATIVE

## 2012-07-05 LAB — PROTIME-INR: INR: 1.11 (ref 0.00–1.49)

## 2012-07-05 LAB — GLUCOSE, CAPILLARY
Glucose-Capillary: 177 mg/dL — ABNORMAL HIGH (ref 70–99)
Glucose-Capillary: 170 mg/dL — ABNORMAL HIGH (ref 70–99)

## 2012-07-05 LAB — HEMOGLOBIN A1C: Mean Plasma Glucose: 166 mg/dL — ABNORMAL HIGH (ref <117)

## 2012-07-05 SURGERY — LEFT HEART CATHETERIZATION WITH CORONARY ANGIOGRAM
Anesthesia: LOCAL

## 2012-07-05 MED ORDER — HEPARIN SODIUM (PORCINE) 5000 UNIT/ML IJ SOLN
5000.0000 [IU] | Freq: Three times a day (TID) | INTRAMUSCULAR | Status: DC
  Administered 2012-07-05 – 2012-07-07 (×5): 5000 [IU] via SUBCUTANEOUS
  Filled 2012-07-05 (×8): qty 1

## 2012-07-05 MED ORDER — NITROGLYCERIN 1 MG/10 ML FOR IR/CATH LAB
INTRA_ARTERIAL | Status: AC
  Filled 2012-07-05: qty 10

## 2012-07-05 MED ORDER — MIDAZOLAM HCL 2 MG/2ML IJ SOLN
INTRAMUSCULAR | Status: AC
  Filled 2012-07-05: qty 2

## 2012-07-05 MED ORDER — HEPARIN (PORCINE) IN NACL 2-0.9 UNIT/ML-% IJ SOLN
INTRAMUSCULAR | Status: AC
  Filled 2012-07-05: qty 1000

## 2012-07-05 MED ORDER — RANOLAZINE ER 500 MG PO TB12
500.0000 mg | ORAL_TABLET | Freq: Two times a day (BID) | ORAL | Status: DC
  Administered 2012-07-05 – 2012-07-07 (×4): 500 mg via ORAL
  Filled 2012-07-05 (×5): qty 1

## 2012-07-05 MED ORDER — ONDANSETRON HCL 4 MG/2ML IJ SOLN: 4.0000 mg | Freq: Four times a day (QID) | INTRAMUSCULAR | Status: DC | PRN

## 2012-07-05 MED ORDER — INSULIN ASPART PROT & ASPART (70-30 MIX) 100 UNIT/ML ~~LOC~~ SUSP
10.0000 [IU] | Freq: Once | SUBCUTANEOUS | Status: AC
  Administered 2012-07-06: 10 [IU] via SUBCUTANEOUS
  Filled 2012-07-05: qty 10

## 2012-07-05 MED ORDER — ISOSORBIDE MONONITRATE ER 60 MG PO TB24
60.0000 mg | ORAL_TABLET | Freq: Every day | ORAL | Status: DC
  Administered 2012-07-05 – 2012-07-07 (×3): 60 mg via ORAL
  Filled 2012-07-05 (×3): qty 1

## 2012-07-05 MED ORDER — LIDOCAINE HCL (PF) 1 % IJ SOLN
INTRAMUSCULAR | Status: AC
  Filled 2012-07-05: qty 30

## 2012-07-05 MED ORDER — SODIUM CHLORIDE 0.9 % IV SOLN
INTRAVENOUS | Status: DC
  Administered 2012-07-05: 1000 mL via INTRAVENOUS

## 2012-07-05 MED ORDER — INSULIN ASPART 100 UNIT/ML ~~LOC~~ SOLN
1.0000 [IU] | Freq: Three times a day (TID) | SUBCUTANEOUS | Status: DC
  Administered 2012-07-05 – 2012-07-06 (×3): 3 [IU] via SUBCUTANEOUS
  Administered 2012-07-06: 1 [IU] via SUBCUTANEOUS
  Administered 2012-07-06: 2 [IU] via SUBCUTANEOUS

## 2012-07-05 MED ORDER — HEPARIN BOLUS VIA INFUSION
2000.0000 [IU] | Freq: Once | INTRAVENOUS | Status: AC
  Administered 2012-07-05: 2000 [IU] via INTRAVENOUS
  Filled 2012-07-05: qty 2000

## 2012-07-05 MED ORDER — ACETAMINOPHEN 325 MG PO TABS: 650.0000 mg | ORAL_TABLET | ORAL | Status: DC | PRN

## 2012-07-05 MED ORDER — FENTANYL CITRATE 0.05 MG/ML IJ SOLN
INTRAMUSCULAR | Status: AC
  Filled 2012-07-05: qty 2

## 2012-07-05 NOTE — Progress Notes (Signed)
ANTICOAGULATION CONSULT NOTE  Pharmacy Consult for UFH Indication: NSTEMI  Allergies  Allergen Reactions  . Ciprofloxacin Hives and Swelling    Patient Measurements: Height: 5\' 6"  (167.6 cm) Weight: 279 lb 12.2 oz (126.9 kg) IBW/kg (Calculated) : 59.3 Heparin Dosing Weight: 90kg  Vital Signs: Temp: 98.5 F (36.9 C) (03/10 2354) Temp src: Oral (03/10 2354) BP: 165/71 mmHg (03/11 0015) Pulse Rate: 76 (03/11 0015)  Labs:  Recent Labs  07/04/12 1507 07/04/12 1601 07/04/12 1620 07/04/12 2350 07/05/12 0100  HGB 12.7  --   --   --   --   HCT 36.5  --   --   --   --   PLT 284  --   --   --   --   APTT  --   --  29  --   --   LABPROT  --   --  13.5  --   --   INR  --   --  1.04  --   --   HEPARINUNFRC  --   --   --  0.94* 0.11*  CREATININE 0.96  --   --   --   --   TROPONINI  --  2.08*  --   --   --     Estimated Creatinine Clearance: 105.1 ml/min (by C-G formula based on Cr of 0.96).  Assessment: 42 y/o female with chest pain.  Initial heparin level draw above IV site.  Goal of Therapy:  Heparin level 0.3-0.7 units/ml Monitor platelets by anticoagulation protocol: Yes   Plan:  Heparin 2000 units IV bolus, then increase heparin 1550 units/hr Follow-up am labs.  Geannie Risen, PharmD, BCPS  07/05/2012,1:38 AM

## 2012-07-05 NOTE — Progress Notes (Signed)
Pt placed on nasal auto CPAP. Pt tolerating well RT will continue to monitor

## 2012-07-05 NOTE — Progress Notes (Signed)
Inpatient Diabetes Program Recommendations  AACE/ADA: New Consensus Statement on Inpatient Glycemic Control (2013)  Target Ranges:  Prepandial:   less than 140 mg/dL      Peak postprandial:   less than 180 mg/dL (1-2 hours)      Critically ill patients:  140 - 180 mg/dL  Results for MCKAY, BRANDT (MRN [id number]) as of 07/05/2012 10:56  Ref. Range 07/04/2012 21:12 07/04/2012 23:17 07/05/2012 08:17  Glucose-Capillary Latest Range: 70-99 mg/dL 846 (H) 962 (H) 952 (H)   Inpatient Diabetes Program Recommendations Correction (SSI): Start Novolog sensitive scale Q 4 while NPO and TID + HS when starts PO's  (Glycemic Control Order Set) Thank you  Piedad Climes BSN, RN,CDE Inpatient Diabetes Coordinator [phone] (team pager)

## 2012-07-05 NOTE — Care Management Note (Signed)
    Page 1 of 1   07/05/2012     11:58:04 AM   CARE MANAGEMENT NOTE 07/05/2012  Patient:  Kimberly Ramos, Kimberly Ramos   Account Number:  [id number]  Date Initiated:  07/05/2012  Documentation initiated by:  Junius Creamer  Subjective/Objective Assessment:   adm w mi     Action/Plan:   lives w husband, pcp dr Greggory Stallion [id number]-[id number]   Anticipated DC Date:     Anticipated DC Plan:        DC Planning Services  CM consult      Choice offered to / List presented to:             Status of service:   Medicare Important Message given?   (If response is "NO", the following Medicare IM given date fields will be blank) Date Medicare IM given:   Date Additional Medicare IM given:    Discharge Disposition:    Per UR Regulation:  Reviewed for med. necessity/level of care/duration of stay  If discussed at Long Length of Stay Meetings, dates discussed:    Comments:  3/11 1157a debbie dowell rn,bsn

## 2012-07-05 NOTE — Progress Notes (Addendum)
Placed pt on cpap as per order. Pt is on auto-titrate max 20, min 4, with the nasal mask. Pt. Is tolerating well at this time. RT to monitor.

## 2012-07-05 NOTE — Progress Notes (Signed)
[facility]  Subjective: Denies further CP. No SOB.  Objective: Vital signs in last 24 hours: Temp:  [98.1 F (36.7 C)-98.6 F (37 C)] 98.6 F (37 C) (03/11 0354) Pulse Rate:  [61-86] 80 (03/11 0600) Resp:  [13-27] 16 (03/11 0409) BP: (137-173)/(61-99) 168/90 mmHg (03/11 0600) SpO2:  [98 %-100 %] 98 % (03/11 0409) Weight:  [279 lb 12.2 oz (126.9 kg)-280 lb (127.007 kg)] 279 lb 15.8 oz (127 kg) (03/11 0500) Last BM Date: 07/04/12  Intake/Output from previous day: 03/10 0701 - 03/11 0700 In: 908 [P.O.:240; I.V.:668] Out: 400 [Urine:400] Intake/Output this shift:    Medications Current Facility-Administered Medications  Medication Dose Route Frequency Therasa Lorenzi Last Rate Last Dose  . 0.9 %  sodium chloride infusion   Intravenous STAT Gavin Pound. Ghim, MD      . 0.9 %  sodium chloride infusion  250 mL Intravenous PRN Brittainy Simmons, PA-C 10 mL/hr at 07/05/12 0010 250 mL at 07/05/12 0010  . acetaminophen (TYLENOL) tablet 650 mg  650 mg Oral Q4H PRN Brittainy Simmons, PA-C      . albuterol (PROVENTIL HFA;VENTOLIN HFA) 108 (90 BASE) MCG/ACT inhaler 2 puff  2 puff Inhalation Q6H PRN Brittainy Simmons, PA-C      . ALPRAZolam (XANAX) tablet 0.25 mg  0.25 mg Oral BID PRN Brittainy Simmons, PA-C      . aspirin chewable tablet 81 mg  81 mg Oral Daily Marykay Lex, MD      . aspirin EC tablet 81 mg  81 mg Oral Daily Brittainy Simmons, PA-C      . atorvastatin (LIPITOR) tablet 40 mg  40 mg Oral q1800 Brittainy Simmons, PA-C      . cyclobenzaprine (FLEXERIL) tablet 10 mg  10 mg Oral TID PRN Brittainy Simmons, PA-C      . darifenacin (ENABLEX) 24 hr tablet 7.5 mg  7.5 mg Oral Daily Brittainy Simmons, PA-C      . dipyridamole-aspirin (AGGRENOX) 200-25 MG per 12 hr capsule 1 capsule  1 capsule Oral Daily Brittainy Simmons, PA-C      . EPINEPHrine (EPI-PEN) injection 0.3 mg  0.3 mg Intramuscular PRN Brittainy Simmons, PA-C      . ezetimibe (ZETIA) tablet 10 mg   10 mg Oral Daily Brittainy Simmons, PA-C      . furosemide (LASIX) tablet 40 mg  40 mg Oral Daily Brittainy Simmons, PA-C      . heparin ADULT infusion 100 units/mL (25000 units/250 mL)  1,550 Units/hr Intravenous Continuous Marykay Lex, MD 15.5 mL/hr at 07/05/12 0142 1,550 Units/hr at 07/05/12 0142  . lisinopril (PRINIVIL,ZESTRIL) tablet 40 mg  40 mg Oral Daily Brittainy Simmons, PA-C      . metoprolol (LOPRESSOR) tablet 50 mg  50 mg Oral BID Brittainy Simmons, PA-C   50 mg at 07/05/12 0012  . nitroGLYCERIN (NITROLINGUAL) 0.4 MG/SPRAY spray 1 spray  1 spray Sublingual Q5 min PRN Brittainy Simmons, PA-C      . nitroGLYCERIN (NITROSTAT) SL tablet 0.4 mg  0.4 mg Sublingual Q5 Min x 3 PRN Brittainy Simmons, PA-C      . ondansetron (ZOFRAN) injection 4 mg  4 mg Intravenous Q6H PRN Brittainy Simmons, PA-C      . sodium chloride 0.9 % injection 3 mL  3 mL Intravenous Q12H Brittainy Simmons, PA-C   3 mL at 07/04/12 2245  . sodium chloride 0.9 % injection 3 mL  3 mL Intravenous PRN Brittainy Simmons, PA-C      .  traMADol (ULTRAM) tablet 50 mg  50 mg Oral Q6H PRN Brittainy Simmons, PA-C      . zolpidem (AMBIEN) tablet 5 mg  5 mg Oral QHS PRN Robbie Lis, PA-C        PE: General appearance: alert, cooperative and no distress Lungs: clear to auscultation bilaterally Heart: regular rate and rhythm Extremities: no LEE Pulses: 2+ and symmetric Skin: warm and dry Neurologic: Grossly normal  Lab Results:   Recent Labs  07/04/12 1507 07/05/12 0530  WBC 9.9 7.0  HGB 12.7 11.2*  HCT 36.5 32.9*  PLT 284 271   BMET  Recent Labs  07/04/12 1507 07/05/12 0530  NA 137 135  K 4.4 3.6  CL 101 101  CO2 24 23  GLUCOSE 128* 155*  BUN 13 12  CREATININE 0.96 0.84  CALCIUM 9.0 8.6   PT/INR  Recent Labs  07/04/12 1620  LABPROT 13.5  INR 1.04   Cholesterol  Recent Labs  07/05/12 0530  CHOL 115   Cardiac Enzymes Cardiac Panel (last 3 results)  Recent Labs  07/04/12 1601   TROPONINI 2.08*   Assessment/Plan  Principal Problem:   NSTEMI (non-ST elevated myocardial infarction) Active Problems:   CAD (coronary artery disease)   DM (diabetes mellitus)   HTN (hypertension)   Obesity  Plan:  Admitted yesterday for NSTEMI w/ troponin of 2.08. She is currently CP free. Keep on IV heparin. Tentatively planned for LHC today by Dr. Tresa Endo w/ possible PCI. Renal function is stable at 0.84. INR is 1.04. BP is elevated at 171/70. Pt hasn't received morning meds yet. She is on 50 mg of Lopressor BID. Will hold ACE-I for cath, as well as Metformin and Lasix. Maintaining NSR on telemetry w/ HR of 68. Will continue to monitor BP. May consider adding IV Nitro if BP does not improve after am dose of BB. Cath lab may also give hydralazine if needed.   LOS: 1 day    Brittainy M. Delmer Islam 07/05/2012 7:53 AM   Patient seen and examined. Agree with assessment and plan. Discussed cath with patient . Mildly positive troponin without significant ECG changes. Plan cath with possible PCI.   Lennette Bihari, MD, [facility] 07/05/2012 12:16 PM

## 2012-07-05 NOTE — CV Procedure (Signed)
Cardiac Catheterization  Kimberly Ramos, 41 y.o., female  Complete note dictated; see diagramn paper chart.  DICTATION # [id number], [id number]  Ao: 167/91 LV: 167/22  LM: nl LAD: subtotal occlusion after S1 and before large bifurcating DX vessel, with late filling of LAD to apex (This apparently was present on 2010 angios but was not recognized;therefore, not new) RI: nl LCX:Nl with L to R collaterals RCA: old subtotal proximal stenosis with slightly improved antegrade filling and distal filling with right to left and right to right collaterals.  LV FXN: EF 55% with mild inferior hypokinesis.  Will add imdur and ranexa; review with Dr. Herbie Baltimore.   Lennette Bihari, MD, [facility] 07/05/2012 3:06 PM

## 2012-07-05 NOTE — Progress Notes (Signed)
ANTICOAGULATION CONSULT NOTE  Pharmacy Consult for [facility] Indication: NSTEMI  Allergies  Allergen Reactions  . Ciprofloxacin Hives and Swelling    Patient Measurements: Height: 5\' 6"  (167.6 cm) Weight: 279 lb 15.8 oz (127 kg) IBW/kg (Calculated) : 59.3 Heparin Dosing Weight: 90kg  Vital Signs: Temp: 98.7 F (37.1 C) (03/11 0816) Temp src: Oral (03/11 0816) BP: 171/70 mmHg (03/11 0800) Pulse Rate: 74 (03/11 0800)  Labs:  Recent Labs  07/04/12 1507 07/04/12 1601 07/04/12 1620 07/04/12 2350 07/05/12 0100 07/05/12 0530 07/05/12 0925  HGB 12.7  --   --   --   --  11.2*  --   HCT 36.5  --   --   --   --  32.9*  --   PLT 284  --   --   --   --  271  --   APTT  --   --  29  --   --   --   --   LABPROT  --   --  13.5  --   --   --  14.2  INR  --   --  1.04  --   --   --  1.11  HEPARINUNFRC  --   --   --  0.94* 0.11*  --  0.48  CREATININE 0.96  --   --   --   --  0.84  --   TROPONINI  --  2.08*  --   --   --   --   --     Estimated Creatinine Clearance: 120.2 ml/min (by C-G formula based on Cr of 0.84).  Assessment: 42 y/o female with chest pain on heparin. Heparin level is therapeutic (0.48). No bleeding noted. Platelets are WNL.   Goal of Therapy:  Heparin level 0.3-0.7 units/ml Monitor platelets by anticoagulation protocol: Yes   Plan:  Continue heparin at 1550 units/hr Follow up after cath   Thank you,  Brett Fairy, PharmD, BCPS 07/05/2012 10:30 AM

## 2012-07-06 ENCOUNTER — Encounter (HOSPITAL_COMMUNITY): Payer: Self-pay

## 2012-07-06 LAB — GLUCOSE, CAPILLARY
Glucose-Capillary: 146 mg/dL — ABNORMAL HIGH (ref 70–99)
Glucose-Capillary: 189 mg/dL — ABNORMAL HIGH (ref 70–99)

## 2012-07-06 LAB — BASIC METABOLIC PANEL
GFR calc Af Amer: >90 mL/min (ref >90)
GFR calc non Af Amer: >90 mL/min (ref >90)
Potassium: 3.4 mEq/L — ABNORMAL LOW (ref 3.5–5.1)
Sodium: 137 mEq/L (ref 135–145)

## 2012-07-06 MED ORDER — AMLODIPINE BESYLATE 2.5 MG PO TABS
2.5000 mg | ORAL_TABLET | Freq: Every day | ORAL | Status: DC
  Administered 2012-07-06 – 2012-07-07 (×2): 2.5 mg via ORAL
  Filled 2012-07-06 (×2): qty 1

## 2012-07-06 MED ORDER — INSULIN ASPART PROT & ASPART (70-30 MIX) 100 UNIT/ML ~~LOC~~ SUSP
30.0000 [IU] | Freq: Two times a day (BID) | SUBCUTANEOUS | Status: DC
  Administered 2012-07-06 – 2012-07-07 (×2): 30 [IU] via SUBCUTANEOUS
  Filled 2012-07-06: qty 10

## 2012-07-06 MED ORDER — ALPRAZOLAM 0.25 MG PO TABS: 0.2500 mg | ORAL_TABLET | Freq: Three times a day (TID) | ORAL | Status: DC | PRN

## 2012-07-06 NOTE — Cardiovascular Report (Signed)
NAMEHENRI, GUEDES NO.:  [id number]  MEDICAL RECORD NO.:  [id number]  LOCATION:  2920                         FACILITY:  [facility]  PHYSICIAN:  Nicki Guadalajara, M.D.     DATE OF BIRTH:  10/03/1970  DATE OF PROCEDURE:  07/05/2012 DATE OF DISCHARGE:                           CARDIAC CATHETERIZATION   PROCEDURE:  Left heart catheterization:  Cine coronary angiography; left ventriculography; selective angiography into the left subclavian internal mammary artery.  INDICATIONS:  Ms. Gracelin Barbian is a 42 year old African American female, who has a history of known coronary artery disease, diabetes mellitus, prior stroke, and hypertension.  Apparently in 2010, she had undergone cardiac catheterization by Dr. Mariah Milling where she was found to have a subtotally occluded RCA with predominant left-to-right collaterals and some right-to-right collaterals.  She had been treated medically. Recently, she developed episode of recurrent chest pain, nausea, vomiting with diaphoresis leading to hospitalization yesterday. Troponin was mildly positive.  ECG was unremarkable.  She now presents for cardiac catheterization.  PROCEDURE:  After premedication with Versed 2 mg plus fentanyl 50 mcg, the patient was prepped and draped in usual fashion.  Her right femoral artery was punctured anteriorly and a 5-French sheath was inserted without difficulty.  Diagnostic catheterization was done utilizing 5- [place] Judkins 4 left and right coronary catheters.  The right catheter was also used for selective angiography into the left subclavian internal mammary artery.  A 5-French pigtail catheter was used for RAO ventriculography.  Before I broke scrub, I did review the old cine angiographic findings from the catheterization of 2010.  By my review, it did appear that she did have a subtotal LAD occlusion with late filling, which was not appreciated at that time, which is also present on today's study  in addition to her RCA lesion.  At this point, decision was made to end the procedure.  Angiographic findings will be reviewed with Dr. Herbie Baltimore.  The patient tolerated the procedure well.  HEMODYNAMIC DATA:  Central aortic pressure 163/91, left ventricular pressure 167/22.  ANGIOGRAPHIC DATA:  Left main coronary artery was angiographically normal and trifurcated into an LAD, ramus intermediate vessel and left circumflex system.  The LAD was a moderate-sized vessel.  However, after giving rise to a septal perforating artery and before giving rise to a large bifurcating diagonal vessel which on the previous cath was assumed to be the LAD. It is apparent that the LAD is subtotally occluded just proximal to this.  There is late filling of the LAD which extends all the way to the LV apex.  There is diffuse disease in this LAD vessel, particularly in the mid segment.  The bifurcating diagonal vessel was large caliber as was the proximal septal perforating artery.  The intermediate vessel was angiographically normal and bifurcated apically.  The circumflex vessel was angiographically normal and gave rise to a prominent marginal vessel.  There also was significant filling of the distal RCA via the distal circumflex  The native right coronary artery was small caliber.  This was previously subtotally occluded with bridging collaterals.  There now appeared to be slightly improved antegrade filling but with very diffuse 95 proximal to mid  stenoses.  After an anterior RV marginal branch, again there was faint filling of the distal limb but this distal limb was collateralized via the left circulation.  The left subclavian internal mammary artery were normal and suitable in the event CABG revascularization surgery is recommended.  RAO ventriculography revealed ejection fraction of 55% with very minimal inferior hypocontractility.  IMPRESSION: 1. Preserved global left ventricular  contractility with an ejection     fraction of 55% with very minimal inferior hypocontractility. 2. Predominant 2-vessel coronary artery disease with subtotal     occlusion of the left anterior descending after the first septal,     but before a large bifurcating diagonal vessel with evidence for     late filling and a moderate-sized left anterior descending system     which extends to the apex. 3. Normal ramus intermediate and left circumflex coronary artery. 4. Old subtotal occlusion of the right coronary artery with actually     improved antegrade filling and evidence for both right-to-right     collaterals as well as significant left-to-right collaterals.  DISCUSSION:  Ms. Arkin angiographic findings were reviewed in 2010 films.  Even though the report demonstrates the LAD appeared normal, it does appear that the LAD was subtotally occluded at that time and again late filling was seen, which was not appreciated due to the large bifurcating diagonal system and septal perforating artery.  The present finding again shows this finding but actually late filling seems somewhat improved.  Initially, the patient will be started on nitrates as well as ranolazine.  Cineangiograms were reviewed with Dr. Herbie Baltimore. Alternatives also include attempt at percutaneous revascularization versus CABG revascularization surgery.          ______________________________ Nicki Guadalajara, M.D.     TK/MEDQ  D:  07/05/2012  T:  07/06/2012  Job:  [id number]  cc:   Landry Corporal, MD

## 2012-07-06 NOTE — Progress Notes (Signed)
[facility]  Subjective: No further chest pain. No right groin pain.  Objective: Vital signs in last 24 hours: Temp:  [98.5 F (36.9 C)-98.7 F (37.1 C)] 98.5 F (36.9 C) (03/12 0348) Pulse Rate:  [57-91] 60 (03/11 2335) Resp:  [16-22] 20 (03/12 0340) BP: (145-189)/(68-100) 156/78 mmHg (03/12 0340) SpO2:  [95 %-100 %] 95 % (03/12 0348) Last BM Date: 07/04/12  Intake/Output from previous day: 03/11 0701 - 03/12 0700 In: 1585.8 [P.O.:500; I.V.:1085.8] Out: 1100 [Urine:1100] Intake/Output this shift:    Medications Current Facility-Administered Medications  Medication Dose Route Frequency Provider Last Rate Last Dose  . 0.9 %  sodium chloride infusion   Intravenous Continuous Lennette Bihari, MD      . acetaminophen (TYLENOL) tablet 650 mg  650 mg Oral Q4H PRN Robbie Lis, PA-C   650 mg at 07/06/12 0203  . albuterol (PROVENTIL HFA;VENTOLIN HFA) 108 (90 BASE) MCG/ACT inhaler 2 puff  2 puff Inhalation Q6H PRN Brittainy Simmons, PA-C      . ALPRAZolam Prudy Feeler) tablet 0.25 mg  0.25 mg Oral BID PRN Robbie Lis, PA-C   0.25 mg at 07/05/12 2119  . aspirin chewable tablet 81 mg  81 mg Oral Daily Marykay Lex, MD      . aspirin EC tablet 81 mg  81 mg Oral Daily Brittainy Simmons, PA-C      . atorvastatin (LIPITOR) tablet 40 mg  40 mg Oral q1800 Brittainy Simmons, PA-C   40 mg at 07/05/12 1837  . cyclobenzaprine (FLEXERIL) tablet 10 mg  10 mg Oral TID PRN Robbie Lis, PA-C   10 mg at 07/05/12 2344  . darifenacin (ENABLEX) 24 hr tablet 7.5 mg  7.5 mg Oral Daily Brittainy Simmons, PA-C   7.5 mg at 07/05/12 0916  . dipyridamole-aspirin (AGGRENOX) 200-25 MG per 12 hr capsule 1 capsule  1 capsule Oral Daily Brittainy Simmons, PA-C   1 capsule at 07/05/12 0914  . EPINEPHrine (EPI-PEN) injection 0.3 mg  0.3 mg Intramuscular PRN Brittainy Simmons, PA-C      . ezetimibe (ZETIA) tablet 10 mg  10 mg Oral Daily Brittainy Simmons, PA-C   10 mg at  07/05/12 0914  . furosemide (LASIX) tablet 40 mg  40 mg Oral Daily Brittainy Simmons, PA-C      . heparin injection 5,000 Units  5,000 Units Subcutaneous Q8H Lennette Bihari, MD   5,000 Units at 07/06/12 0615  . insulin aspart (novoLOG) injection 1-3 Units  1-3 Units Subcutaneous TID AC & HS [name], MD   3 Units at 07/05/12 2343  . isosorbide mononitrate (IMDUR) 24 hr tablet 60 mg  60 mg Oral Daily Lennette Bihari, MD   60 mg at 07/05/12 1651  . lisinopril (PRINIVIL,ZESTRIL) tablet 40 mg  40 mg Oral Daily Brittainy Simmons, PA-C      . metoprolol (LOPRESSOR) tablet 50 mg  50 mg Oral BID Brittainy Simmons, PA-C   50 mg at 07/05/12 2114  . nitroGLYCERIN (NITROLINGUAL) 0.4 MG/SPRAY spray 1 spray  1 spray Sublingual Q5 min PRN Brittainy Simmons, PA-C      . nitroGLYCERIN (NITROSTAT) SL tablet 0.4 mg  0.4 mg Sublingual Q5 Min x 3 PRN Brittainy Simmons, PA-C      . ondansetron (ZOFRAN) injection 4 mg  4 mg Intravenous Q6H PRN Brittainy Simmons, PA-C      . ranolazine (RANEXA) 12 hr tablet 500 mg  500 mg Oral BID Lennette Bihari, MD   500  mg at 07/05/12 1652  . traMADol (ULTRAM) tablet 50 mg  50 mg Oral Q6H PRN Brittainy Simmons, PA-C   50 mg at 07/06/12 0025  . zolpidem (AMBIEN) tablet 5 mg  5 mg Oral QHS PRN Robbie Lis, PA-C        PE: General appearance: alert, cooperative and no distress Lungs: clear to auscultation bilaterally Heart: regular rate and rhythm, S1, S2 normal, no murmur, click, rub or gallop Extremities: no LEE. Rt Groin: no bleeding, ecchymossis, + small hematoma, no bruit Abd: obese, soft, NT/ND/NABS Pulses: 2+ and symmetric Skin: warm and dry  Neurologic: Grossly normal, CN II-XII grossly intact.  Lab Results:   Recent Labs  07/04/12 1507 07/05/12 0530  WBC 9.9 7.0  HGB 12.7 11.2*  HCT 36.5 32.9*  PLT 284 271   BMET  Recent Labs  07/04/12 1507 07/05/12 0530  NA 137 135  K 4.4 3.6  CL 101 101  CO2 24 23  GLUCOSE 128* 155*  BUN 13 12   CREATININE 0.96 0.84  CALCIUM 9.0 8.6   PT/INR  Recent Labs  07/04/12 1620 07/05/12 0925  LABPROT 13.5 14.2  INR 1.04 1.11   Cholesterol  Recent Labs  07/05/12 0530  CHOL 115   Cardiac Enzymes Cardiac Panel (last 3 results)  Recent Labs  07/04/12 1601 07/05/12 0935  TROPONINI 2.08* 2.21*    Studies/Results: Diagnostic LHC 07/06/12 DICTATION # [id number], [id number]  Ao: 167/91  LV: 167/22  LM: nl  LAD: subtotal occlusion after S1 and before large bifurcating DX vessel, with late filling of LAD to apex (This apparently was present on 2010 angios but was not recognized;therefore, not new)  RI: nl  LCX:Nl with L to R collaterals  RCA: old subtotal proximal stenosis with slightly improved antegrade filling and distal filling with right to left and right to right collaterals.  LV FXN: EF 55% with mild inferior hypokinesis.  Will add imdur and ranexa; review with Dr. Herbie Baltimore.  Lennette Bihari, MD, [facility]  07/05/2012  3:06 PM  Assessment/Plan  Principal Problem:   NSTEMI (non-ST elevated myocardial infarction) Active Problems:   CAD (coronary artery disease)   DM (diabetes mellitus)   HTN (hypertension)   Obesity  Plan: S/P diagnostic LHC yesterday w/ no PCI, by Dr. Tresa Endo.  There was subtotal occlusion of LAD after S1 and before large bifurcating DX vessel, with late filling of LAD to apex (This apparently was present on 2010 angios but was not recognized;therefore, not new). There was old subtotal proximal stenosis with slightly improved antegrade filling and distal filling with right to left and right to right collaterals. Plan to continue with medical therapy for now. ? The need for CABG with LAD disease. She was already on daily ASA, a BB, an ACE-I and statin. Imdur and Ranexa were added yesterday.  She is CP free. Rt groin is stable. NSR on telemetry. BP has been mildly elevated, but ACE-I and Lasix were both held yesterday. Will check BMP today to assess renal function.  If Cr is WNL then resume lisinopril and Lasix. Continue to hold Janumet (sitagliptan-metformin). Dr. Herbie Baltimore to review films with Dr. Tresa Endo. Dr. Herbie Baltimore will provide further recommendation.     LOS: 2 days    Brittainy M. Delmer Islam 07/06/2012 7:47 AM  I have seen and evaluated the patient this AM along with [place], PA. I agree with her findings, examination as well as impression recommendations.  I have reviewed the cath films -- sadly, it would  appear that a chronically occluded LAD at the time of her catheterization in 2010 was not identified.  This was done to investigate an anterior-anteroseptal perfusion defect.  Post cath, the defect was determined to be due to breast attenuation.  She presented with clear angina & + Troponin, this week, but her cath films did not identify a "new" lesion to target.  This leaves [place] with the question of what to do with an occluded LAD & RCA -- both of which clearly fill via collateral vessels.  Currently, she denies any further CP or other associated Sx.   I spent in consultation discussing options for plan of care.  We have decided on:  Nitrate, Ranexa & additional BP control with Amlodipine  Ambulate with [facility] today -- if no active Sx, transfer this PM to Tele.    More aggressive ambulation tomorrow (3/12), if not active Angina or severe DOE, would plan to d/c tomorrow & arrange for OP Lexiscan Cardiolite next week.    If Lexiscan suggests an Anterior defect with reversibility & not "infarct", would schedule for attempted revascularization of CTO LAD (best date 3/26 - when both myself & Dr. Tresa Endo are present, in case Rotablator Atherectomy is needed.  Otherwise, continue to optimize medical therapy.  If she does not to well over the next 2 days -- would perform Lexiscan Cardiolite as in-patient on Friday (and ask one of my colleagues to over-read the Radiology interpretation in comparison to her prior stress test).  Continue DM  coverage. Gentle diuresis for chronic edema. Statin + Zetia.  [facility] consult.  Time with Patient: 45 min  HARDING,DAVID W, M.D., M.S. [facility] [address] [place], [place]  [address]  [phone] Pager # [phone] 07/06/2012 9:00 AM

## 2012-07-06 NOTE — Progress Notes (Addendum)
Pt sts she walked with RN earlier without problems. Sts she is sleepy now and doesn't want to walk however she will go again later. Began ed and gave pt MI book and diets. Discussed CRPII in [facility] and will discuss further tomorrow. Will f/u in am. [phone] Elissa Lovett [place] CES, ACSM 07/06/2012. 1:53 PM

## 2012-07-06 NOTE — Progress Notes (Signed)
Report called to Kimberly Heck, RN on 2000. Pt stable at the time of transfer, VS WNL for the pt.  Pt has no complaints at this time. Pt transported via wheelchair accompanied by the RN and the NT.

## 2012-07-07 ENCOUNTER — Encounter (HOSPITAL_COMMUNITY): Payer: Self-pay | Admitting: Cardiology

## 2012-07-07 ENCOUNTER — Other Ambulatory Visit (HOSPITAL_COMMUNITY): Payer: Self-pay | Admitting: Cardiology

## 2012-07-07 DIAGNOSIS — I2510 Atherosclerotic heart disease of native coronary artery without angina pectoris: Secondary | ICD-10-CM

## 2012-07-07 DIAGNOSIS — I214 Non-ST elevation (NSTEMI) myocardial infarction: Secondary | ICD-10-CM

## 2012-07-07 DIAGNOSIS — F17200 Nicotine dependence, unspecified, uncomplicated: Secondary | ICD-10-CM

## 2012-07-07 HISTORY — DX: Nicotine dependence, unspecified, uncomplicated: F17.200

## 2012-07-07 LAB — GLUCOSE, CAPILLARY

## 2012-07-07 MED ORDER — RANOLAZINE ER 500 MG PO TB12: 500.0000 mg | ORAL_TABLET | Freq: Two times a day (BID) | ORAL | Status: DC

## 2012-07-07 MED ORDER — NITROGLYCERIN 0.4 MG/SPRAY TL SOLN: 1.0000 | Status: DC | PRN

## 2012-07-07 MED ORDER — ACETAMINOPHEN 325 MG PO TABS: 650.0000 mg | ORAL_TABLET | ORAL | Status: DC | PRN

## 2012-07-07 MED ORDER — POTASSIUM CHLORIDE CRYS ER 20 MEQ PO TBCR
20.0000 meq | EXTENDED_RELEASE_TABLET | Freq: Once | ORAL | Status: AC
  Administered 2012-07-07: 20 meq via ORAL
  Filled 2012-07-07: qty 1

## 2012-07-07 MED ORDER — ISOSORBIDE MONONITRATE ER 60 MG PO TB24: 60.0000 mg | ORAL_TABLET | Freq: Every day | ORAL | Status: DC

## 2012-07-07 MED ORDER — AMLODIPINE BESYLATE 2.5 MG PO TABS: 2.5000 mg | ORAL_TABLET | Freq: Every day | ORAL | Status: DC

## 2012-07-07 NOTE — Progress Notes (Signed)
CARDIAC REHAB PHASE I   PRE:  Rate/Rhythm: 75 SR    BP: sitting 140/78    SaO2:   MODE:  Ambulation: 1100 ft   POST:  Rate/Rhythm: 88 SR    BP: sitting 150/80     SaO2:   Tolerated very well. No c/o with quick pace, long distance. Ed completed. Pt admitted to 5 cigarettes a day, counseled on cessation and she sts she is motivated. Requests her name be sent to [facility]. [phone]    Harriet Masson CES, ACSM 07/07/2012 12:21 PM

## 2012-07-07 NOTE — Progress Notes (Signed)
Placed pt. On CPAP via nasal mask, auto titrate settings. Water chamber filled. Pt. Tolerating well at this time. RN aware.

## 2012-07-07 NOTE — Progress Notes (Signed)
I have seen and examined the patient along with [facility] R, NP.  I have reviewed the chart, notes and new data.  I agree with NP's note.  Key new complaints: no angina with light activity Key examination changes: no clinical signs of CHF and no arrhythmia on monitor Key new findings / data: films reviewed  PLAN: As outlined by Dr. Herbie Baltimore, before proceeding with high risk CTO PCI, we need data regarding viability/ischemia in the two coronary territories involved. Will arrange the appropriate imaging studies and follow-up. CABG of LAD and RCA is always an alternative, but her young age is a compelling argument to attempt PCI if appropriate.  Thurmon Fair, MD, [facility] [phone] 07/07/2012, 1:07 PM

## 2012-07-07 NOTE — Discharge Summary (Signed)
Physician Discharge Summary  Patient ID: Kimberly Ramos MRN: [id number] DOB/AGE: 42-Oct-1972 42 y.o.  Admit date: 07/04/2012 Discharge date: 07/07/2012  Discharge Diagnoses:  Principal Problem:   NSTEMI (non-ST elevated myocardial infarction) Active Problems:   CAD (coronary artery disease), LAD ansd RCA disease, possible intervention to be decided as outpatient.   DM (diabetes mellitus)   HTN (hypertension)   Obesity   Tobacco use disorder   Discharged Condition: good  Procedures: cardiac cath by Dr. Tresa Endo 07/05/12  Hospital Course: 42 y.o. female who is morbidly obese with a history of CAD, DM, stroke as well as hypertension, followed by Dr. Herbie Baltimore, with previous cath in 2010 that revealed a nearly complete blocked RCA, however she had adequate supply of collaterals and was treated medically. She last saw Dr.Harding in clinic on 04/19/2012 and at that time was doing well from a cardiovascular standpoint. She presented to the [facility] ED 07/04/12 with a complaint of substernal chest pressure. She has never had this type of pain before. She was at the eye doctor's office when she developed nausea with vomiting. She was diaphoretic and developed chest pressure which radiated to her right shoulder. She had mild SOB. The pain was non-pleuritic, non-positional, non-exertional and 6/10. She left the eye doctor and when to her PCP office, which was across the street. There she was given SL NTG x 1 and had immediate relief. She denies any further chest pain or related symptoms. She denies recent orthopnea/PND, syncope/presyncope. She reports compliance with all of her medications.    She was admitted with elevated Troponin POC at 0.80, follow up troponin 2.08.  Placed on IV Heparin with plans for cardiac cath the next am.   EKG showed subtle ST changes in the anterolateral leads, but no T wave changes.   Peak troponin 2.21 by AM and she underwent cardiac cath.  Results: Ao: 167/91  LV: 167/22  LM: nl   LAD: subtotal occlusion after S1 and before large bifurcating DX vessel, with late filling of LAD to apex (This apparently was present on 2010 angios but was not recognized;therefore, not new)  RI: nl  LCX:Nl with L to R collaterals  RCA: old subtotal proximal stenosis with slightly improved antegrade filling and distal filling with right to left and right to right collaterals.  LV FXN: EF 55% with mild inferior hypokinesis.   Drs. Tresa Endo and Herbie Baltimore discussed plan.  She had no further chest pain. She was ambulated with cardiac rehab.  No further chest pain.  By 07/07/12 no pain, felt well.  It was decided to discharge home with outpatient nuclear stress test and depending on results Dr. Herbie Baltimore will decide if PCI needed.  She was seen and evaluated by Dr. Royann Shivers and discharged home on Norvasc, imdur and Ranexa.      Consults: None  Significant Diagnostic Studies:  BMET    Component Value Date/Time   NA 137 07/06/2012 0918   K 3.4* 07/06/2012 0918   CL 110 07/06/2012 0918   CO2 16* 07/06/2012 0918   GLUCOSE 125* 07/06/2012 0918   BUN 7 07/06/2012 0918   CREATININE 0.60 07/06/2012 0918   CALCIUM 7.2* 07/06/2012 0918   GFRNONAA >90 07/06/2012 0918   GFRAA >90 07/06/2012 0918    CBC    Component Value Date/Time   WBC 7.0 07/05/2012 0530   RBC 3.56* 07/05/2012 0530   HGB 11.2* 07/05/2012 0530   HCT 32.9* 07/05/2012 0530   PLT 271 07/05/2012 0530   MCV 92.4  07/05/2012 0530   MCH 31.5 07/05/2012 0530   MCHC 34.0 07/05/2012 0530   RDW 13.7 07/05/2012 0530   LYMPHSABS 1.8 07/04/2012 1507   MONOABS 0.8 07/04/2012 1507   EOSABS 0.0 07/04/2012 1507   BASOSABS 0.0 07/04/2012 1507    Pk troponin 2.21, was down to 0.46 prior to discharge  CXR 2 view 07/04/12: CHEST - 2 VIEW  Comparison: 09/24/2010.  Findings: Normal sized heart. Clear lungs with normal vascularity.  Normal appearing bones.  IMPRESSION:  Normal examination     Discharge Exam: Blood pressure 139/88, pulse 64, temperature 98.5 F  (36.9 C), temperature source Oral, resp. rate 18, height 5\' 6"  (1.676 m), weight 125.7 kg (277 lb 1.9 oz), SpO2 98.00%.  am exam:   PE: General:alert and oriented, MAE follows commands  Heart:S1S2 RRR without murmur gallup rub or click  Lungs:clear, without rales, rhonchi or wheezes  ZOX:WRUEA soft, non tender, do not palpate liver spleen or masse  Ext:no edema  Disposition: 01-Home or Self Care  Discharge Orders   Future Appointments Provider Department Dept Phone   07/15/2012 10:15 AM [facility] [place] [facility] [address] [phone]   Future Orders Complete By Expires     Amb Referral to Cardiac Rehabilitation  As directed     Comments:      Pt agrees to [facility]. CRP in [place], will send referral.        Medication List    TAKE these medications       acetaminophen 325 MG tablet  Commonly known as:  TYLENOL  Take 2 tablets (650 mg total) by mouth every 4 (four) hours as needed.     albuterol 108 (90 BASE) MCG/ACT inhaler  Commonly known as:  PROVENTIL HFA;VENTOLIN HFA  Inhale 2 puffs into the lungs every 6 (six) hours as needed for wheezing.     amLODipine 2.5 MG tablet  Commonly known as:  NORVASC  Take 1 tablet (2.5 mg total) by mouth daily.     aspirin 81 MG tablet  Take 81 mg by mouth daily.     atorvastatin 40 MG tablet  Commonly known as:  LIPITOR  Take 40 mg by mouth daily.     cyclobenzaprine 10 MG tablet  Commonly known as:  FLEXERIL  Take 10 mg by mouth 3 (three) times daily as needed for muscle spasms.     dipyridamole-aspirin 200-25 MG per 12 hr capsule  Commonly known as:  AGGRENOX  Take 1 capsule by mouth daily.     EPIPEN 0.3 mg/0.3 mL Devi  Generic drug:  EPINEPHrine  Inject 0.3 mg into the muscle once.     ezetimibe 10 MG tablet  Commonly known as:  ZETIA  Take 10 mg by mouth daily.     furosemide 40 MG tablet  Commonly known as:  LASIX  Take 40 mg by mouth daily.     insulin aspart protamine-insulin aspart  (70-30) 100 UNIT/ML injection  Commonly known as:  NOVOLOG 70/30  Inject 30 Units into the skin 2 (two) times daily with a meal.     isosorbide mononitrate 60 MG 24 hr tablet  Commonly known as:  IMDUR  Take 1 tablet (60 mg total) by mouth daily.     lisinopril 40 MG tablet  Commonly known as:  PRINIVIL,ZESTRIL  Take 40 mg by mouth daily.     metoprolol 50 MG tablet  Commonly known as:  LOPRESSOR  Take 50 mg by mouth 2 (two) times  daily.     nitroGLYCERIN 0.4 MG/SPRAY spray  Commonly known as:  NITROLINGUAL  Place 1 spray under the tongue every 5 (five) minutes as needed for chest pain.     ranolazine 500 MG 12 hr tablet  Commonly known as:  RANEXA  Take 1 tablet (500 mg total) by mouth 2 (two) times daily.     sitaGLIPtan-metformin 50-500 MG per tablet  Commonly known as:  JANUMET  Take 1 tablet by mouth 2 (two) times daily with a meal.     solifenacin 10 MG tablet  Commonly known as:  VESICARE  Take 10 mg by mouth daily as needed.     traMADol 50 MG tablet  Commonly known as:  ULTRAM  Take 50 mg by mouth every 6 (six) hours as needed for pain.           Follow-up Information   Follow up with Marykay Lex, MD On 07/15/2012. (at 10:15 AM for nuclear stress test.)    Contact information:   [address], [address] [place] [address] [phone]       Follow up with [facility] R, NP On 07/21/2012. (at 11:00 am-- Dr. Herbie Baltimore will be in office that day also)    Contact information:   [address] [place] [address] [phone]     Discharge Instructions: NO janumet until AM tomorrow.  No lifting for 2 weeks over 8 pounds, no driving for 4 days.  Stop smoking.    The night before your test, do not eat or drink after midnight.  No caffeine for 24 hours prior to procedure.  SignedLeone Brand 07/07/2012, 5:22 PM  Time spent on discharge :45 minutes including MD time.    Nada Boozer,  FNP-C [facility] [phone] 07/07/2012.now

## 2012-07-07 NOTE — Progress Notes (Signed)
Subjective: No complaints, just walked in hall way with cardiac rehab at faster speed than yesterday without pain.  This am mild Lt chest pressure she felt was secondary to lying on lt side with pillows, resolved with moving around.     Objective: Vital signs in last 24 hours: Temp:  [98.1 F (36.7 C)-98.7 F (37.1 C)] 98.3 F (36.8 C) (03/13 0431) Pulse Rate:  [64-76] 65 (03/13 0431) Resp:  [18-20] 18 (03/13 0431) BP: (144-173)/(75-91) 144/83 mmHg (03/13 1034) SpO2:  [100 %] 100 % (03/13 0431) Weight:  [125.7 kg (277 lb 1.9 oz)] 125.7 kg (277 lb 1.9 oz) (03/13 0700) Weight change:  Last BM Date: 07/06/12 Intake/Output from previous day: -580 03/12 0701 - 03/13 0700 In: 820 [P.O.:820] Out: 1400 [Urine:1400] Intake/Output this shift: Total I/O In: 240 [P.O.:240] Out: -   PE: General:alert and oriented, MAE follows commands Heart:S1S2 RRR without murmur gallup rub or click Lungs:clear, without rales, rhonchi or wheezes ZOX:WRUEA soft, non tender, do not palpate liver spleen or masse Ext:no edema    Lab Results:  Recent Labs  07/04/12 1507 07/05/12 0530  WBC 9.9 7.0  HGB 12.7 11.2*  HCT 36.5 32.9*  PLT 284 271   BMET  Recent Labs  07/05/12 0530 07/06/12 0918  NA 135 137  K 3.6 3.4*  CL 101 110  CO2 23 16*  GLUCOSE 155* 125*  BUN 12 7  CREATININE 0.84 0.60  CALCIUM 8.6 7.2*    Recent Labs  07/05/12 0935 07/06/12 0918  TROPONINI 2.21* 0.46*    Lab Results  Component Value Date   CHOL 115 07/05/2012   HDL 43 07/05/2012   LDLCALC 53 07/05/2012   TRIG 95 07/05/2012   CHOLHDL 2.7 07/05/2012   Lab Results  Component Value Date   HGBA1C 7.4* 07/04/2012     No results found for this basename: TSH    Hepatic Function Panel  Recent Labs  07/04/12 1507  PROT 7.1  ALBUMIN 3.4*  AST 20  ALT 16  ALKPHOS 115  BILITOT 0.5    Recent Labs  07/05/12 0530  CHOL 115   Studies/Results: No results found.  Medications: I have reviewed the patient's  current medications. Marland Kitchen amLODipine  2.5 mg Oral Daily  . aspirin  81 mg Oral Daily  . aspirin EC  81 mg Oral Daily  . atorvastatin  40 mg Oral q1800  . darifenacin  7.5 mg Oral Daily  . dipyridamole-aspirin  1 capsule Oral Daily  . ezetimibe  10 mg Oral Daily  . furosemide  40 mg Oral Daily  . heparin  5,000 Units Subcutaneous Q8H  . insulin aspart  1-3 Units Subcutaneous TID AC & HS  . insulin aspart protamine-insulin aspart  30 Units Subcutaneous BID WC  . isosorbide mononitrate  60 mg Oral Daily  . lisinopril  40 mg Oral Daily  . metoprolol  50 mg Oral BID  . ranolazine  500 mg Oral BID   Assessment/Plan: Principal Problem:   NSTEMI (non-ST elevated myocardial infarction) Active Problems:   CAD (coronary artery disease)   DM (diabetes mellitus)   HTN (hypertension)   Obesity  PLAN: S/P diagnostic LHC 07/05/12  w/ no PCI, by Dr. Tresa Endo. There was subtotal occlusion of LAD after S1 and before large bifurcating DX vessel, with late filling of LAD to apex . There was old subtotal proximal stenosis with slightly improved antegrade filling and distal filling with right to left and right to right collaterals. Plan to  continue with medical therapy for now, plan for out pt nuc next week. Hypokalemia yesterday, no documentation of replacement will give 20 po now.   Discharge home today vs. tomorrow?  MD to see. Will need new script for NTG spray at discharge.   See Dr. Elissa Hefty note.     LOS: 3 days   Time spent with pt. :20 minutes. [name],[name] 07/07/2012, 11:38 AM

## 2012-07-15 ENCOUNTER — Ambulatory Visit (HOSPITAL_COMMUNITY)
Admit: 2012-07-15 | Discharge: 2012-07-15 | Disposition: A | Discharge status: Home / Self Care | Payer: Medicare Other | Source: Ambulatory Visit | Attending: Cardiology | Admitting: Cardiology

## 2012-07-15 DIAGNOSIS — I214 Non-ST elevation (NSTEMI) myocardial infarction: Secondary | ICD-10-CM | POA: Insufficient documentation

## 2012-07-15 DIAGNOSIS — I2510 Atherosclerotic heart disease of native coronary artery without angina pectoris: Secondary | ICD-10-CM | POA: Insufficient documentation

## 2012-07-15 HISTORY — PX: OTHER SURGICAL HISTORY: SHX169

## 2012-07-15 MED ORDER — TECHNETIUM TC 99M SESTAMIBI GENERIC - CARDIOLITE
31.0000 | Freq: Once | INTRAVENOUS | Status: AC | PRN
  Administered 2012-07-15: 31 via INTRAVENOUS

## 2012-07-15 MED ORDER — REGADENOSON 0.4 MG/5ML IV SOLN
0.4000 mg | Freq: Once | INTRAVENOUS | Status: AC
  Administered 2012-07-15: 0.4 mg via INTRAVENOUS

## 2012-07-15 MED ORDER — TECHNETIUM TC 99M SESTAMIBI GENERIC - CARDIOLITE
10.5000 | Freq: Once | INTRAVENOUS | Status: AC | PRN
  Administered 2012-07-15: 11 via INTRAVENOUS

## 2012-07-15 NOTE — Procedures (Addendum)
[place] [facility] [address] [place] [address] [place] [address] [phone]  Cardiology Nuclear Med Study  Kimberly Ramos is a 42 y.o. female     MRN : [id number]     DOB: Dec 16, 1970  Procedure Date: 07/15/2012  Nuclear Med Background Indication for Stress Test:  PTCA Patency and Post Hospital History:  CAD;MI;PTCA 07/05/2012 Cardiac Risk Factors: CVA, Family History - CAD, Hypertension, Lipids, NIDDM, Obesity, Smoker and TIA  Symptoms:  Dizziness, DOE, Light-Headedness and SOB   Nuclear Pre-Procedure Caffeine/Decaff Intake:  10:00pm NPO After: 8:00am   IV Site: R Antecubital  IV 0.9% NS with Angio Cath:  22g  Chest Size (in):  46"  IV Started by: Emmit Pomfret, RN  Height: 5\' 6"  (1.676 m)  Cup Size: DD  BMI:  Body mass index is 45.21 kg/(m^2). Weight:  280 lb (127.007 kg)   Tech Comments:  N/A    Nuclear Med Study 1 or 2 day study: 1 day  Stress Test Type:  Lexiscan  Order Authorizing Provider:  Nanetta Batty, MD   Resting Radionuclide: Technetium 59m Sestamibi  Resting Radionuclide Dose: 10.5 mCi   Stress Radionuclide:  Technetium 36m Sestamibi  Stress Radionuclide Dose: 31.0 mCi           Stress Protocol Rest HR: *67 Stress HR: 96  Rest BP: 128/83 Stress BP: 113/90  Exercise Time (min): n/a METS: n/a   Predicted Max HR: 179 bpm % Max HR: 53.63 bpm Rate Pressure Product: 21308  Dose of Adenosine (mg):  n/a Dose of Lexiscan: 0.4 mg  Dose of Atropine (mg): n/a Dose of Dobutamine: n/a mcg/kg/min (at max HR)  Stress Test Technologist: Esperanza Sheets, CCT Nuclear Technologist: Gonzella Lex, CNMT   Rest Procedure:  Myocardial perfusion imaging was performed at rest 45 minutes following the intravenous administration of Technetium 29m Sestamibi. Stress Procedure:  The patient received IV Lexiscan 0.4 mg over 15-seconds.  Technetium 54m Sestamibi injected at 30-seconds.  There were no significant changes with Lexiscan.   Quantitative spect images were obtained after a 45 minute delay.  Transient Ischemic Dilatation (Normal <1.22):0.90 Lung/Heart Ratio (Normal <0.45):  0.24 QGS EDV: 111 ml QGS ESV:  47 ml LV Ejection Fraction: 57%  Rest ECG: NSR - Normal EKG  Stress ECG: No significant change from baseline ECG  QPS Raw Data Images:  Significant RV and lung uptake, diaphragmatic attenuation Stress Images:  Large defect of the entire anteroseptal wall. Rest Images:  Small apical anterior defect. Subtraction (SDS):  19 - suggestive of marked LAD territory ischemia which improves at rest.  Impression Exercise Capacity:  Lexiscan with no exercise. BP Response:  Normal blood pressure response. Clinical Symptoms:  No significant symptoms noted. ECG Impression:  No significant ECG changes with Lexiscan. Comparison with Prior Nuclear Study: No significant change from previous study.  Review of her NST in 2012 showed a large area of anterior ischemia which is near identical to this study.    Overall Impression:  High risk stress nuclear study.  Large area of anterior and septal ischemia which is almost totally reversible.  Based on recent catheterization findings, a revascularization attempt of the anterior wall is advisable.  LV Wall Motion:  NL LV Function; NL Wall Motion; EF 57%.  [name], MD, [facility] Board Certified in Nuclear Cardiology Attending Cardiologist [facility]  [name], MD  07/15/2012 3:13 PM

## 2012-07-19 ENCOUNTER — Encounter (HOSPITAL_COMMUNITY): Payer: Self-pay | Admitting: Cardiology

## 2012-07-19 ENCOUNTER — Other Ambulatory Visit: Payer: Self-pay | Admitting: Cardiology

## 2012-07-19 NOTE — H&P (Signed)
Kimberly Ramos is an 42 y.o. female.   Chief Complaint: chest pain and known CAD with Positive stress test.  HPI: 42 y.o. female who is morbidly obese with a history of CAD, DM, stroke as well as hypertension, followed by Dr. Herbie Baltimore, with previous cath in 2010 that revealed a nearly complete blocked RCA, however she had adequate supply of collaterals and was treated medically.  She presented to the [facility] ED 07/04/12 with a complaint of substernal chest pressure. She has never had this type of pain before. She was at the eye doctor's office when she developed nausea with vomiting. She was diaphoretic and developed chest pressure which radiated to her right shoulder. She had mild SOB. The pain was non-pleuritic, non-positional, non-exertional and 6/10. She left the eye doctor and when to her PCP office, which was across the street. There she was given SL NTG x 1 and had immediate relief.   She was admitted with elevated Troponin POC at 0.80, follow up troponin 2.08. EKG showed subtle ST changes in the anterolateral leads, but no T wave changes. Peak troponin 2.21 by AM and she underwent cardiac cath. Results: Ao: 167/91  LV: 167/22  LM: nl  LAD: subtotal occlusion after S1 and before large bifurcating DX vessel, with late filling of LAD to apex (This apparently was present on 2010 angios but was not recognized;therefore, not new)  RI: nl  LCX:Nl with L to Ramos collaterals  RCA: old subtotal proximal stenosis with slightly improved antegrade filling and distal filling with right to left and right to right collaterals.  LV FXN: EF 55% with mild inferior hypokinesis.   Plan was medical therapy and outpatient stress test which she underwent 07/15/12.    Comparison with Prior Nuclear Study: No significant change from previous study. Review of her NST in 2012 showed a large area of anterior ischemia which is near identical to this study.  Overall Impression: High risk stress nuclear study. Large area of anterior  and septal ischemia which is almost totally reversible. Based on recent catheterization findings, a revascularization attempt of the anterior wall is advisable.  LV Wall Motion: NL LV Function; NL Wall Motion; EF 57%.  Dr. Rennis Golden and Dr. Herbie Baltimore reviewed and believe it would benefit the pt. To proceed with PCI to the LAD.  Pt was seen today with the results and discussed the benefit to proceeding with PCI may bring.  Pt and her husband are agreeable to proceed.    One episode last pm of chest pain, lasting 5 min.  Resolved without NTG.  No other episodes since discharge.  She has stopped smoking, she was congratulated.   Past Medical History  Diagnosis Date  . Stroke   . Tobacco use disorder 07/07/2012  . NSTEMI (non-ST elevated myocardial infarction)   . CAD (coronary artery disease)     2 vessel disease old sub totaled occ. of RCA and subtotal occ. of LAD    Past Surgical History  Procedure Laterality Date  . Cardiac catheterization      07/05/12  + CAD    Family History  Problem Relation Age of Onset  . Diabetes Mother   . Diabetes Father   . Heart attack Maternal Grandmother   . Diabetes Maternal Grandmother    Social History:  reports that she quit smoking about 2 weeks ago. She has never used smokeless tobacco. She reports that  drinks alcohol. She reports that she does not use illicit drugs.  Allergies:  Allergies  Allergen Reactions  . Ciprofloxacin Hives and Swelling   OUTPATIENT Medications: Amlodipine 2.5mg  daily IMDUR 60 mg daily Ranexa 500 mg BID Ventolin MDI 2 puffs every 6 hours PRN ASA 81 mg daily Cyclobenzaprine 10 mg tid prn aggrenox 200/25 daily epiprn zetia 10 mg daily lasix 40 mg daily Novolog70/30 30 units bid Lopressor 50 mg bid ntg prn vesicare prn Tramadol 50 mg prn  No results found for this or any previous visit (from the past 48 hour(s)). No results found.  ROS: General:no colds or fevers, no weight changes Skin:no rashes or  ulcers HEENT:no blurred vision, no congestion CV:see HPI PUL:see HPI GI:no diarrhea constipation or melena, no indigestion GU:no hematuria, no dysuria MS:no joint pain, no claudication Neuro:no syncope, no lightheadedness, has residual rt hand and rt leg weakness though mild Endo:+ diabetes doing well and controlled, no thyroid disease GYN has an IUD, she would like to have tubal ligation   BP120/80  P 80 wt 275.9  Ht 5'6" PE: General:alert and oriented, pleasant affect, NAD  Skin:warm and dry, brisk capillary refill, no rashes HEENT:normocephalic, sclera clear, moist mucus membranes Neck:supple, no JVD, no carotid bruits, 2 + carotid upstroke bil Heart:S1S2 RRR without murmur, gallup, rub or click Lungs:clear without rales, rhonchi or wheezes ZOX:WRUEA, soft, non tender, + BS, do not palpate liver spleen or masses Ext:no edema, 2 + pedal pulses bil, 2+ radial pulses bil Neuro:alert and oriented X3, MAE, follows commands.  + facial symmetry, Rt grip slightly weaker than lt, lower ext with equal strength     Assessment/Plan Principal Problem:   CAD (coronary artery disease), LAD ansd RCA disease, to proceed wtih intervention. Active Problems:   DM (diabetes mellitus)   HTN (hypertension)   NSTEMI (non-ST elevated myocardial infarction), 07/07/12   Obesity   Tobacco use disorder  PLAN:  Has stopped smoking.  Known CAD with LAD subtotaled and positive nuc study with ant ischemia.  Plan for PCI.  Pt has agreed to proceed in AM with Dr. Herbie Baltimore.  She does have an IUD in place.   KimberlyLAURA Ramos 07/19/2012, 7:24 PM  The patient is well known to me.  I discussed her with Ms. [name] & agree with making arrangements to proceed with attempted CTO PCI of the LAD lesion tomorrow.  I agree with Laura's note above.  Kimberly Ramos, M.D., M.S. [facility] [address] [place], [place]  [address]  [phone] Pager #  [phone] 07/19/2012 10:13 PM

## 2012-07-20 ENCOUNTER — Encounter (HOSPITAL_COMMUNITY): Payer: Self-pay | Admitting: General Practice

## 2012-07-20 ENCOUNTER — Ambulatory Visit (HOSPITAL_COMMUNITY)
Admission: RE | Admit: 2012-07-20 | Discharge: 2012-07-21 | Disposition: A | Discharge status: Home / Self Care | Payer: Medicare Other | Source: Ambulatory Visit | Attending: Cardiology | Admitting: Cardiology

## 2012-07-20 ENCOUNTER — Encounter (HOSPITAL_COMMUNITY)
Admission: RE | Disposition: A | Discharge status: Home / Self Care | Payer: Self-pay | Source: Ambulatory Visit | Attending: Cardiology

## 2012-07-20 DIAGNOSIS — Z794 Long term (current) use of insulin: Secondary | ICD-10-CM | POA: Diagnosis present

## 2012-07-20 DIAGNOSIS — E119 Type 2 diabetes mellitus without complications: Secondary | ICD-10-CM | POA: Insufficient documentation

## 2012-07-20 DIAGNOSIS — I2510 Atherosclerotic heart disease of native coronary artery without angina pectoris: Secondary | ICD-10-CM | POA: Insufficient documentation

## 2012-07-20 DIAGNOSIS — F17200 Nicotine dependence, unspecified, uncomplicated: Secondary | ICD-10-CM | POA: Diagnosis present

## 2012-07-20 DIAGNOSIS — I252 Old myocardial infarction: Secondary | ICD-10-CM | POA: Insufficient documentation

## 2012-07-20 DIAGNOSIS — I209 Angina pectoris, unspecified: Secondary | ICD-10-CM | POA: Insufficient documentation

## 2012-07-20 DIAGNOSIS — I10 Essential (primary) hypertension: Secondary | ICD-10-CM

## 2012-07-20 DIAGNOSIS — Z955 Presence of coronary angioplasty implant and graft: Secondary | ICD-10-CM

## 2012-07-20 DIAGNOSIS — I214 Non-ST elevation (NSTEMI) myocardial infarction: Secondary | ICD-10-CM

## 2012-07-20 HISTORY — DX: Unspecified osteoarthritis, unspecified site: M19.90

## 2012-07-20 HISTORY — DX: Personal history of other diseases of the digestive system: Z87.19

## 2012-07-20 HISTORY — PX: PERCUTANEOUS CORONARY STENT INTERVENTION (PCI-S): SHX5485

## 2012-07-20 HISTORY — DX: Unspecified asthma, uncomplicated: J45.909

## 2012-07-20 HISTORY — DX: Non-ST elevation (NSTEMI) myocardial infarction: I21.4

## 2012-07-20 HISTORY — DX: Gastro-esophageal reflux disease without esophagitis: K21.9

## 2012-07-20 HISTORY — DX: Type 2 diabetes mellitus without complications: E11.9

## 2012-07-20 HISTORY — DX: Essential (primary) hypertension: I10

## 2012-07-20 LAB — URINALYSIS, ROUTINE W REFLEX MICROSCOPIC
Nitrite: NEGATIVE
Protein, ur: NEGATIVE mg/dL
Specific Gravity, Urine: 1.008 (ref 1.005–1.030)
Urobilinogen, UA: 0.2 mg/dL (ref 0.0–1.0)

## 2012-07-20 LAB — BASIC METABOLIC PANEL
CO2: 24 mEq/L (ref 19–32)
Chloride: 101 mEq/L (ref 96–112)
Sodium: 139 mEq/L (ref 135–145)

## 2012-07-20 LAB — PREGNANCY, URINE: Preg Test, Ur: NEGATIVE

## 2012-07-20 LAB — CBC
HCT: 39.4 % (ref 36.0–46.0)
MCV: 91.8 fL (ref 78.0–100.0)
Platelets: 350 10*3/uL (ref 150–400)
RBC: 4.29 MIL/uL (ref 3.87–5.11)
WBC: 6.4 10*3/uL (ref 4.0–10.5)

## 2012-07-20 LAB — GLUCOSE, CAPILLARY
Glucose-Capillary: 148 mg/dL — ABNORMAL HIGH (ref 70–99)
Glucose-Capillary: 121 mg/dL — ABNORMAL HIGH (ref 70–99)
Glucose-Capillary: 278 mg/dL — ABNORMAL HIGH (ref 70–99)

## 2012-07-20 LAB — POCT ACTIVATED CLOTTING TIME: Activated Clotting Time: 404 seconds

## 2012-07-20 LAB — PROTIME-INR: INR: 1.07 (ref 0.00–1.49)

## 2012-07-20 LAB — URINE MICROSCOPIC-ADD ON

## 2012-07-20 SURGERY — PERCUTANEOUS CORONARY STENT INTERVENTION (PCI-S)
Anesthesia: LOCAL

## 2012-07-20 MED ORDER — VERAPAMIL HCL 2.5 MG/ML IV SOLN
INTRAVENOUS | Status: AC
  Filled 2012-07-20: qty 2

## 2012-07-20 MED ORDER — ATORVASTATIN CALCIUM 40 MG PO TABS
40.0000 mg | ORAL_TABLET | Freq: Every day | ORAL | Status: DC
  Administered 2012-07-21: 40 mg via ORAL
  Filled 2012-07-20: qty 1

## 2012-07-20 MED ORDER — SODIUM CHLORIDE 0.9 % IV SOLN: INTRAVENOUS | Status: DC

## 2012-07-20 MED ORDER — SODIUM CHLORIDE 0.9 % IJ SOLN: 3.0000 mL | INTRAMUSCULAR | Status: DC | PRN

## 2012-07-20 MED ORDER — NITROGLYCERIN 0.4 MG/SPRAY TL SOLN
1.0000 | Status: DC | PRN
  Filled 2012-07-20: qty 4.9

## 2012-07-20 MED ORDER — ALBUTEROL SULFATE HFA 108 (90 BASE) MCG/ACT IN AERS
2.0000 | INHALATION_SPRAY | Freq: Four times a day (QID) | RESPIRATORY_TRACT | Status: DC | PRN
  Filled 2012-07-20: qty 6.7

## 2012-07-20 MED ORDER — INSULIN ASPART 100 UNIT/ML ~~LOC~~ SOLN
0.0000 [IU] | Freq: Three times a day (TID) | SUBCUTANEOUS | Status: DC
  Administered 2012-07-20: 18:00:00 7 [IU] via SUBCUTANEOUS
  Administered 2012-07-21: 2 [IU] via SUBCUTANEOUS

## 2012-07-20 MED ORDER — TICAGRELOR 90 MG PO TABS
ORAL_TABLET | ORAL | Status: AC
  Filled 2012-07-20: qty 2

## 2012-07-20 MED ORDER — NITROGLYCERIN 1 MG/10 ML FOR IR/CATH LAB
INTRA_ARTERIAL | Status: AC
  Filled 2012-07-20: qty 10

## 2012-07-20 MED ORDER — NICOTINE 14 MG/24HR TD PT24
14.0000 mg | MEDICATED_PATCH | Freq: Every day | TRANSDERMAL | Status: DC
  Administered 2012-07-20 – 2012-07-21 (×2): 14 mg via TRANSDERMAL
  Filled 2012-07-20 (×2): qty 1

## 2012-07-20 MED ORDER — AMLODIPINE BESYLATE 2.5 MG PO TABS
2.5000 mg | ORAL_TABLET | Freq: Every day | ORAL | Status: DC
  Administered 2012-07-21: 2.5 mg via ORAL
  Filled 2012-07-20: qty 1

## 2012-07-20 MED ORDER — ASPIRIN EC 81 MG PO TBEC
81.0000 mg | DELAYED_RELEASE_TABLET | Freq: Every day | ORAL | Status: DC
  Administered 2012-07-21: 81 mg via ORAL
  Filled 2012-07-20: qty 1

## 2012-07-20 MED ORDER — FENTANYL CITRATE 0.05 MG/ML IJ SOLN
INTRAMUSCULAR | Status: AC
  Filled 2012-07-20: qty 2

## 2012-07-20 MED ORDER — MIDAZOLAM HCL 2 MG/2ML IJ SOLN
INTRAMUSCULAR | Status: AC
  Filled 2012-07-20: qty 2

## 2012-07-20 MED ORDER — SODIUM CHLORIDE 0.9 % IV SOLN: 250.0000 mL | INTRAVENOUS | Status: DC | PRN

## 2012-07-20 MED ORDER — TRAMADOL HCL 50 MG PO TABS
50.0000 mg | ORAL_TABLET | Freq: Four times a day (QID) | ORAL | Status: DC | PRN
  Administered 2012-07-20: 17:00:00 50 mg via ORAL
  Filled 2012-07-20: qty 1

## 2012-07-20 MED ORDER — CYCLOBENZAPRINE HCL 10 MG PO TABS: 10.0000 mg | ORAL_TABLET | Freq: Three times a day (TID) | ORAL | Status: DC | PRN

## 2012-07-20 MED ORDER — BIVALIRUDIN 250 MG IV SOLR
INTRAVENOUS | Status: AC
  Filled 2012-07-20: qty 250

## 2012-07-20 MED ORDER — HEPARIN (PORCINE) IN NACL 2-0.9 UNIT/ML-% IJ SOLN
INTRAMUSCULAR | Status: AC
  Filled 2012-07-20: qty 1000

## 2012-07-20 MED ORDER — SODIUM CHLORIDE 0.9 % IJ SOLN: 3.0000 mL | Freq: Two times a day (BID) | INTRAMUSCULAR | Status: DC

## 2012-07-20 MED ORDER — TICAGRELOR 90 MG PO TABS
90.0000 mg | ORAL_TABLET | Freq: Two times a day (BID) | ORAL | Status: DC
  Administered 2012-07-21: 90 mg via ORAL
  Filled 2012-07-20 (×2): qty 1

## 2012-07-20 MED ORDER — INSULIN ASPART PROT & ASPART (70-30 MIX) 100 UNIT/ML ~~LOC~~ SUSP
30.0000 [IU] | Freq: Two times a day (BID) | SUBCUTANEOUS | Status: DC
  Filled 2012-07-20: qty 10

## 2012-07-20 MED ORDER — SODIUM CHLORIDE 0.9 % IV SOLN
0.2500 mg/kg/h | INTRAVENOUS | Status: AC
  Administered 2012-07-20: 0.25 mg/kg/h via INTRAVENOUS
  Filled 2012-07-20: qty 250

## 2012-07-20 MED ORDER — ISOSORBIDE MONONITRATE ER 60 MG PO TB24
60.0000 mg | ORAL_TABLET | Freq: Every day | ORAL | Status: DC
  Administered 2012-07-21: 60 mg via ORAL
  Filled 2012-07-20: qty 1

## 2012-07-20 MED ORDER — SODIUM CHLORIDE 0.9 % IV SOLN: 1.0000 mL/kg/h | INTRAVENOUS | Status: AC

## 2012-07-20 MED ORDER — DARIFENACIN HYDROBROMIDE ER 15 MG PO TB24
15.0000 mg | ORAL_TABLET | Freq: Every day | ORAL | Status: DC
  Administered 2012-07-21: 15 mg via ORAL
  Filled 2012-07-20: qty 1

## 2012-07-20 MED ORDER — ALPRAZOLAM 0.25 MG PO TABS: 0.2500 mg | ORAL_TABLET | Freq: Three times a day (TID) | ORAL | Status: DC | PRN

## 2012-07-20 MED ORDER — ONDANSETRON HCL 4 MG/2ML IJ SOLN: 4.0000 mg | Freq: Four times a day (QID) | INTRAMUSCULAR | Status: DC | PRN

## 2012-07-20 MED ORDER — ACETAMINOPHEN 325 MG PO TABS
650.0000 mg | ORAL_TABLET | ORAL | Status: DC | PRN
  Administered 2012-07-20: 15:00:00 650 mg via ORAL
  Filled 2012-07-20: qty 2

## 2012-07-20 MED ORDER — EZETIMIBE 10 MG PO TABS
10.0000 mg | ORAL_TABLET | Freq: Every day | ORAL | Status: DC
  Administered 2012-07-21: 10:00:00 10 mg via ORAL
  Filled 2012-07-20: qty 1

## 2012-07-20 MED ORDER — METOPROLOL TARTRATE 50 MG PO TABS
50.0000 mg | ORAL_TABLET | Freq: Two times a day (BID) | ORAL | Status: DC
  Administered 2012-07-20 – 2012-07-21 (×2): 50 mg via ORAL
  Filled 2012-07-20 (×3): qty 1

## 2012-07-20 MED ORDER — LIDOCAINE HCL (PF) 1 % IJ SOLN
INTRAMUSCULAR | Status: AC
  Filled 2012-07-20: qty 30

## 2012-07-20 MED ORDER — INSULIN ASPART 100 UNIT/ML ~~LOC~~ SOLN
0.0000 [IU] | Freq: Every day | SUBCUTANEOUS | Status: DC
  Administered 2012-07-20: 2 [IU] via SUBCUTANEOUS

## 2012-07-20 MED ORDER — RANOLAZINE ER 500 MG PO TB12
500.0000 mg | ORAL_TABLET | Freq: Two times a day (BID) | ORAL | Status: DC
  Administered 2012-07-20 – 2012-07-21 (×2): 500 mg via ORAL
  Filled 2012-07-20 (×3): qty 1

## 2012-07-20 MED ORDER — INSULIN ASPART PROT & ASPART (70-30 MIX) 100 UNIT/ML ~~LOC~~ SUSP
5.0000 [IU] | Freq: Two times a day (BID) | SUBCUTANEOUS | Status: DC
  Administered 2012-07-20 – 2012-07-21 (×2): 5 [IU] via SUBCUTANEOUS
  Filled 2012-07-20: qty 10

## 2012-07-20 MED ORDER — LISINOPRIL 40 MG PO TABS
40.0000 mg | ORAL_TABLET | Freq: Every day | ORAL | Status: DC
  Administered 2012-07-21: 40 mg via ORAL
  Filled 2012-07-20: qty 1

## 2012-07-20 MED ORDER — HYDROMORPHONE HCL PF 1 MG/ML IJ SOLN: 1.0000 mg | INTRAMUSCULAR | Status: DC | PRN

## 2012-07-20 MED ORDER — ZOLPIDEM TARTRATE 5 MG PO TABS: 5.0000 mg | ORAL_TABLET | Freq: Every evening | ORAL | Status: DC | PRN

## 2012-07-20 NOTE — Interval H&P Note (Signed)
History and Physical Interval Note:  07/20/2012 9:42 AM  Kimberly Ramos  has presented today for surgery, with the diagnosis of CAD, Abnormal Myoview.   The various methods of treatment have been discussed with the patient and family. After consideration of risks, benefits and other options for treatment, the patient has consented to  Procedure(s): PERCUTANEOUS CORONARY STENT INTERVENTION (PCI-S) (N/A) as a surgical intervention .    The patient's history has been reviewed, patient examined, no change in status, stable for surgery.  I have reviewed the patient's chart and labs.  Questions were answered to the patient's satisfaction.     Marykay Lex, M.D., M.S. [facility] [address] [place], [place]  [address]  [phone] Pager # [phone] 07/20/2012 9:42 AM

## 2012-07-20 NOTE — Brief Op Note (Signed)
07/20/2012  12:28 PM  PROCEDURE:  Procedure(s): PERCUTANEOUS CORONARY STENT INTERVENTION (PCI-S) (N/A)  SURGEON:  Surgeon(s) and Role:    * Marykay Lex, MD - Primary   PATIENT:  Kimberly Ramos  42 y.o. female with what appears to be a missed LAD CTO from >4 yrs ago when a Myoview showed anterior ischemia -- The LAD was mistakenly identified to be patent with no significant lesions (was probably one of the 2 branches of the major D1).  Has had chronic stable angina & DOE, but recently was admitted with a NSTEMI.  Repeat Dx cath identified the known RCA CTO, but also identified that the LAD was in fact 99-100% CTO just after the takeoff of the large bifurcating D1 vessel.  She was discharged with the plan for medical therapy & rechecking a Lexiscan Myoview that showed significant Anterior Perfusion defect with reversibility, suggesting significant LAD distribution ischemia.  She has noted recurrent CP since d/c.  She was seen by Nada Boozer for me in clinic yesterday to discuss results & plan to attempt PCI on the LAD CTO.    PRE-OPERATIVE DIAGNOSIS:  CAD - Known CTO of LAD with full collateral Flow; High Risk  Cardiolite with Anterior Ischemia; Recent NSTEMI  POST-OPERATIVE DIAGNOSIS:  Successful PCI of mid LAD-D2 CTO with AngioSculpt Scoring PTCA then Promus Premier DES 2.25 mm x 20 mm - post-dilated as described in a tapered fashion  ANESTHESIA:   local and IV sedation - 2ml  Lidocaine; 2 mg Versed; 100 mcg Fentanyl  EBL:     BLOOD ADMINISTERED:none  EQUIPMENT/PROCEDURE: 6 Fr R Radial A Access; JR4 & Versicore used to get to the Ascending Aorta --> exchanged for XB LAD 3.5 Guide; Whisper wire with 1.5 mm x 12 mm balloon support to cross the bried CTO lesion just at the "ostium of LAD-D1 bifurcation" --> pre-dilated with 1.5 mm balloon, followed by stepwise dilation with 2.0 mm x 10 mm Angiosculpt balloon --> Promus Premier DES 2.25 mm x 20 mm - post-dilated to 2.3 mm distal, then 2.5 mm  mid & 2.65 mm proximal in the Main LAD crossing D2 using a 2.5 mm x 20 mm Pisek balloon.   MEDICATIONS USED:  Radial Cocktail: 5 mg Verapamil, 400 mcg NTG, 2 ml 2% Lidocaine in 10 ml NS + additional 3mg  Verapamil post procedure; Omnipaque - ; IC NTG 200 mcg x 3; 250 ml NS bolus; 180 mg Brilinta  TR Band: 14 ml air; 1208 hrs  DICTATION: .Note written in EPIC  PLAN OF CARE: Admit for overnight observation; ASA + Brilinta for minimum of 1 month (hold Aggrenox); continue other home meds except for Metformin (Janumet); anticipate d/c in AM.  ROV with me or Nada Boozer, NP next week.  PATIENT DISPOSITION:  PACU - hemodynamically stable.   Delay start of Pharmacological VTE agent (>24hrs) due to surgical blood loss or risk of bleeding: not applicable  HARDING,DAVID W, M.D., M.S. [facility] [address] [place], [place]  [address]  [phone] Pager # [phone] 07/20/2012 12:41 PM

## 2012-07-20 NOTE — CV Procedure (Signed)
[facility] PERCUTANEOUS CORONARY INTERVENTION REPORT  NAME:  Kimberly Ramos   MRN: [id number] DOB:  1970/09/20   ADMIT DATE: 07/20/2012 Procedure Date: 07/20/2012  INTERVENTIONAL CARDIOLOGIST: Marykay Lex, M.D., MS PRIMARY CARE PROVIDER: Jackie Plum, MD PRIMARY CARDIOLOGIST: Marykay Lex, MD, MS  PATIENT:  Kimberly Ramos is a 42 y.o. female who is morbidly obese with a history of CAD, DM, stroke as well as hypertension, followed by Dr. Herbie Baltimore, with previous cath in 2010 (for a Myoview with significant Anterior Ischemia) that revealed a nearly complete blocked RCA, however she had adequate supply of collaterals and was treated medically. She presented to the [facility] ED 07/04/12 with a complaint of substernal chest pressure and ruled in for a NSTEMI.  Diagnostic catheterization revealed the previously noted essentially a focal 100% chronic total occlusion of the LAD just after a large, bifurcating Diagonal 1.  The entire remaining vessel is perfused by a plethora of L-L collaterals from the Circumflex and D1 as well as L-R collaterals that perfuse the entire RCA.  On further review, this LAD occlusion was present in the angiographic films from 2010, but was incorrectly documented in the report, likely mistaking the bifurcating D1 as LAD and D1.   Initial plans were for medical therapy with repeat Lexiscan Cardiolite that was completed last week and again revealed significant Anterior ischemia that would support an attempt at PCI of the LAD CTO.  She was seen by Nada Boozer, NP at [facility] yesterday in preparation for planned PCI today.  PRE-OPERATIVE DIAGNOSIS:    Focal Chronic Total Occlusion of the "Ostium" of the LAD from D1  High Risk Cardiolite Stress Test with Anterior Ischemia without infarction  Recent NSTEMI  Chronic Stable Angina with Dyspnea on Exertion.  PROCEDURES PERFORMED:    Complex Percutaneous Revascularization  of the focal, bifurcation LAD-D1 Chronic "Sub"-Total Occlusion with sequential AngioSculpt "scoring" angioplasty followed by Promus Premier DES 2.25 mm x 20 mm placement with tapered post-dilation from proximal to distal to account for significant size mismatch between newly opened LAD and the proximal (pre-D1 LAD)   Left Heart Catheterization for LV Hemodynamics  PROCEDURE:Consent:  Risks of procedure as well as the alternatives and risks of each were explained to the (patient/caregiver).  Consent for procedure obtained. Consent for signed by MD and patient with RN witness -- placed on chart.   PROCEDURE: The patient was brought to the 2nd Floor [facility] in the fasting state and prepped and draped in the usual sterile fashion for Right groin or radial access. A modified Allen's test with plethysmography was performed, revealing excellent Ulnar artery collateral flow.  Sterile technique was used including antiseptics, cap, gloves, gown, hand hygiene, mask and sheet.  Skin prep: Chlorhexidine.  Sedated with Versed and Fentanyl.  Time Out: Verified patient identification, verified procedure, site/side was marked, verified correct patient position, special equipment/implants available, medications/allergies/relevent history reviewed, required imaging and test results available.  Performed  Access-PCI Setup: Right Radial Artery; 6 Fr Sheath -- Seldinger technique using the Angiocath Micropuncture Kit  Radial Cocktail, IV Angiomax + Drip  Oral Ticagrelor 180 mg given prior to sedation.  JR 4 advanced over a Versicore wire into the ascending Aorta then exchanged over a long-exchange safety J wire for the 6 Fr XB LAD 3.5 Guide  Left Coronary Artery Angiography: 6 Fr XB LAD 3.5 Guide (also used to cross the aortic valve to measure hemodynamics.  TR Band:  1208 Hours, 14 mL air; non-occlusive hemostasis verified via Reverse Allen's test with  Plethysmography  MEDICATIONS:  Anesthesia:  Local Lidocaine 2 ml  Sedation:  2 mg IV Versed, 100 mcg IV fentanyl ;   Premedication: 5 mg PO Valium  Omnipaque Contrast: 240 ml  Anticoagulation:  IV Angiomax Bolus & drip  Anti-Platelet Agent:  Brilinta 180mg   Hemodynamics:  Central Aortic: 110/60 mmHg;   Left Ventricular Pressures / EDP: 108/8 mmHg; 14 mmHg  Coronary Anatomy:  Left Main: Large caliber vessel that trifurcates into the LAD, Ramus Intermedius and Circumflex; angiographically normal LAD: Moderate to large caliber vessel with a proximal Septal perforator trunk and a moderate to large caliber proximal Diagonal 1 (D1) that is at least the same caliber as the proximal LAD.  The follow on LAD has faint filling through a chronic 99-100% "sub-total" occlusion with to-and-fro antegrade flow with competitive flow from L-L collaterals.   The LAD actually wraps the entire apex to the inferoapex, there are several septal perforators and smaller diagonal branches.  D1: Moderate to large caliber, bifurcating vessel with minimal luminal irregularities.  D2: post PCI, there is a small caliber vessel Left Circumflex: Moderate to large caliber vessel with a major lateral OM branch; angiographically normal  Ramus intermedius: Small to moderate caliber vessel that bifurcates distally; angiographically normal.  RCA: Known to be occluded.  Percutaneous Coronary Intervention:   Guide: 6 Fr   XB LAD Guidewire: Whisper -- used initial pre-dilation balloon for support. Predilation Balloon:  Sprinter Legend 1.5 mm x 12 mm;  6 Atm x 35 Sec (LAD-from diag "ostium), 5 Atm x 30 Sec (proximal follow-on LAD) AngioSculpt "Scoring" Balloon: 2.0 mm x 10 mm -- multiple inflations in the "ostial" and proximal follow-on LAD   "ostial" - 4 Atm x 36 Sec, "proximal" - 4 Atm x 45 Sec,6 Atm x 45 Sec, "ostial" - 8 Atm x 45 Sec  "ostial" - 14 Atm x 60 Sec, 16 Atm x 45 Sec; "proximal" - 10 Atm x 60 Sec -- up  to 2.28 mm in Ostium and 2.18 mm proximal  Stent: Promus Premier 2.25 mm x 20 mm;   12 Atm x 45 Sec - nominal diameter 2.25 mm Post-dilation Balloon: [place] Sprinter 2.5 mm x 15 mm;   Distal stent for Proximal "follow-on LAD" --  12 Atm x 45 Sec -- 2.55 mm final diameter (distal 4 mm of stent avoided)  16 Atm x 45 Sec, 18 Atm x 45 Sec  Final Diameter across D1 into main LAD: 2.65 mm  Post deployment angiography in multiple views, with and without guidewire in place revealed excellent stent deployment and lesion coverage.  There was no evidence of dissection or perforation.  PATIENT DISPOSITION:    The patient was transferred to the PACU holding area in a hemodynamicaly stable, chest pain free condition.  The patient tolerated the procedure well, and there were no complications.  EBL:   < 5 ml  The patient was stable before, during, and after the procedure.  POST-OPERATIVE DIAGNOSIS:    Successful, complex Percutaneous Revascularization of the Chronic "subtotally" occluded LAD with focal stenosis at the "ostium" of the follow-on LAD after D1 with ultimate placement of a Promus Premier DES 2.25 mm x 20 mm, post-dilated in a tapered fashion to 2.65 mm in the proximal LAD crossing D2 and 2.5 mm in the mid stent.  The mid and distal LAD is a small caliber vessel with diffuse disease, most notably a severe distal ~  70-80% lesion just prior to the apex.  Normal LVEDP  PLAN OF CARE:  Standard post-radial cath care  DAPT x minium of 1 year - will hold Aggrenox for now  Continue current Cardiac medications.   ROV with either me of Nada Boozer, NP next week.   Marykay Lex, M.D., M.S. [facility] [address] [place], [place]  [address]  [phone]  07/20/2012 12:26 PM

## 2012-07-20 NOTE — Progress Notes (Signed)
Utilization Review Completed Azya Barbero J. Cloris Flippo, RN, BSN, NCM [phone]  

## 2012-07-21 ENCOUNTER — Encounter (HOSPITAL_COMMUNITY): Payer: Self-pay | Admitting: Cardiology

## 2012-07-21 DIAGNOSIS — Z955 Presence of coronary angioplasty implant and graft: Secondary | ICD-10-CM

## 2012-07-21 LAB — CBC
Platelets: 294 10*3/uL (ref 150–400)
RBC: 3.77 MIL/uL — ABNORMAL LOW (ref 3.87–5.11)
RDW: 13 % (ref 11.5–15.5)
WBC: 5.4 10*3/uL (ref 4.0–10.5)

## 2012-07-21 LAB — BASIC METABOLIC PANEL
CO2: 26 mEq/L (ref 19–32)
Chloride: 106 mEq/L (ref 96–112)
GFR calc Af Amer: 85 mL/min — ABNORMAL LOW (ref >90)
Potassium: 3.8 mEq/L (ref 3.5–5.1)
Sodium: 140 mEq/L (ref 135–145)

## 2012-07-21 MED ORDER — NICOTINE 14 MG/24HR TD PT24: 1.0000 | MEDICATED_PATCH | Freq: Every day | TRANSDERMAL | Status: DC

## 2012-07-21 MED ORDER — TICAGRELOR 90 MG PO TABS: 90.0000 mg | ORAL_TABLET | Freq: Two times a day (BID) | ORAL | Status: DC

## 2012-07-21 MED FILL — Dextrose Inj 5%: INTRAVENOUS | Qty: 50 | Status: AC

## 2012-07-21 NOTE — Care Management Note (Signed)
    Page 1 of 1   07/21/2012     10:12:19 AM   CARE MANAGEMENT NOTE 07/21/2012  Patient:  RICHARDINE, PEPPERS   Account Number:  [id number]  Date Initiated:  07/21/2012  Documentation initiated by:  [facility]  Subjective/Objective Assessment:   42 y.o. female who is morbidly obese with a history of CAD, DM, stroke, HTN; admitted for PCI/ CM to do benefits check on Brilinta and notify pt when complete.     Action/Plan:   Home alone   Anticipated DC Date:  07/21/2012   Anticipated DC Plan:  HOME/SELF CARE      DC Planning Services  CM consult      Choice offered to / List presented to:             Status of service:   Medicare Important Message given?   (If response is "NO", the following Medicare IM given date fields will be blank) Date Medicare IM given:   Date Additional Medicare IM given:    Discharge Disposition:    Per UR Regulation:    If discussed at Long Length of Stay Meetings, dates discussed:    Comments:  07/21/12 @ 1000.Marland KitchenMarland KitchenOletta Cohn, RN, BSN, [place] [phone] per rep at [employer] medication is covered, no auth required, patient can use any retail pharmacy. patients co-pay is $3.60.  CM will make pt aware.

## 2012-07-21 NOTE — Progress Notes (Addendum)
07/21/12.Marland KitchenMarland Kitchenper rep at [employer] medication is covered, no auth required, patient can use any retail pharmacy. patients co-pay is $3.60 Ionia Schey J. Lucretia Roers, RN, BSN, [employer] [phone]

## 2012-07-21 NOTE — Progress Notes (Signed)
Inpatient Diabetes Program Recommendations  AACE/ADA: New Consensus Statement on Inpatient Glycemic Control (2013)  Target Ranges:  Prepandial:   less than 140 mg/dL      Peak postprandial:   less than 180 mg/dL (1-2 hours)      Critically ill patients:  140 - 180 mg/dL   Results for Kimberly Ramos, Kimberly Ramos (MRN [id number]) as of 07/21/2012 11:36  Ref. Range 07/20/2012 08:55 07/20/2012 13:03 07/20/2012 17:48 07/20/2012 22:06 07/21/2012 08:02  Glucose-Capillary Latest Range: 70-99 mg/dL 409 (H) 811 (H) 914 (H) 212 (H) 185 (H)    Inpatient Diabetes Program Recommendations Insulin - Basal: Please consider increasing 70/30 to 10 units BID if patient is not discharged today.  Note: Patient has a history of diabetes and takes 70/30 30 units BID and Janumet 50-500 mg BID at home for diabetes management.  Currently, patient is ordered to receive 70/30 5 units BID, Novolog 0-9 units AC, Novolog 0-5 units QHS for inpatient glycemic control.  Note plan to discharge patient home today.  If patient is not discharged today, please consider increasing 70/30 to 10 units BID.  Will continue to follow as an inpatient.   Thanks, Orlando Penner, RN, BSN, CCRN Diabetes Coordinator Inpatient Diabetes Program [phone]

## 2012-07-21 NOTE — Progress Notes (Signed)
[facility]  Subjective: No CP or SOB.  She also reports no LEE for quite some time.  Objective: Vital signs in last 24 hours: Temp:  [98 F (36.7 C)-99.1 F (37.3 C)] 98 F (36.7 C) (03/27 0752) Pulse Rate:  [61-75] 68 (03/27 0449) Resp:  [18] 18 (03/26 0848) BP: (102-142)/(55-86) 142/86 mmHg (03/27 0449) SpO2:  [98 %-100 %] 98 % (03/27 0752) Weight:  [127.007 kg (280 lb)-127.7 kg (281 lb 8.4 oz)] 127.7 kg (281 lb 8.4 oz) (03/27 0449)    Intake/Output from previous day: 03/26 0701 - 03/27 0700 In: 1219.2 [I.V.:1219.2] Out: 1050 [Urine:1050] Intake/Output this shift:    Medications Current Facility-Administered Medications  Medication Dose Route Frequency Provider Last Rate Last Dose  . acetaminophen (TYLENOL) tablet 650 mg  650 mg Oral Q4H PRN Marykay Lex, MD   650 mg at 07/20/12 1516  . albuterol (PROVENTIL HFA;VENTOLIN HFA) 108 (90 BASE) MCG/ACT inhaler 2 puff  2 puff Inhalation Q6H PRN Marykay Lex, MD      . ALPRAZolam Prudy Feeler) tablet 0.25 mg  0.25 mg Oral TID PRN Abelino Derrick, PA-C      . amLODipine (NORVASC) tablet 2.5 mg  2.5 mg Oral Daily Marykay Lex, MD      . aspirin EC tablet 81 mg  81 mg Oral Daily Marykay Lex, MD      . atorvastatin (LIPITOR) tablet 40 mg  40 mg Oral Daily Marykay Lex, MD      . cyclobenzaprine (FLEXERIL) tablet 10 mg  10 mg Oral TID PRN Marykay Lex, MD      . darifenacin (ENABLEX) 24 hr tablet 15 mg  15 mg Oral Daily Marykay Lex, MD      . ezetimibe (ZETIA) tablet 10 mg  10 mg Oral Daily Marykay Lex, MD      . HYDROmorphone (DILAUDID) injection 1 mg  1 mg Intravenous Q4H PRN Abelino Derrick, PA-C      . insulin aspart (novoLOG) injection 0-5 Units  0-5 Units Subcutaneous QHS Nada Boozer, NP   2 Units at 07/20/12 2210  . insulin aspart (novoLOG) injection 0-9 Units  0-9 Units Subcutaneous TID WC Nada Boozer, NP   7 Units at 07/20/12 1756  . insulin aspart protamine-insulin aspart  (NOVOLOG 70/30) injection 5 Units  5 Units Subcutaneous BID WC Nada Boozer, NP   5 Units at 07/20/12 1756  . isosorbide mononitrate (IMDUR) 24 hr tablet 60 mg  60 mg Oral Daily Marykay Lex, MD      . lisinopril (PRINIVIL,ZESTRIL) tablet 40 mg  40 mg Oral Daily Marykay Lex, MD      . metoprolol (LOPRESSOR) tablet 50 mg  50 mg Oral BID Marykay Lex, MD   50 mg at 07/20/12 2050  . nicotine (NICODERM CQ - dosed in mg/24 hours) patch 14 mg  14 mg Transdermal Daily Marykay Lex, MD   14 mg at 07/20/12 2049  . nitroGLYCERIN (NITROLINGUAL) 0.4 MG/SPRAY spray 1 spray  1 spray Sublingual Q5 min PRN Marykay Lex, MD      . ondansetron [facility]) injection 4 mg  4 mg Intravenous Q6H PRN Marykay Lex, MD      . ranolazine [facility] 12 hr tablet 500 mg  500 mg Oral BID Marykay Lex, MD   500 mg at 07/20/12 2050  . Ticagrelor (BRILINTA) tablet 90 mg  90 mg Oral BID Piedad Climes  Herbie Baltimore, MD      . traMADol Janean Sark) tablet 50 mg  50 mg Oral Q6H PRN Abelino Derrick, PA-C   50 mg at 07/20/12 1644  . zolpidem (AMBIEN) tablet 5 mg  5 mg Oral QHS PRN Abelino Derrick, PA-C        PE: General appearance: alert, cooperative and no distress Lungs: clear to auscultation bilaterally Heart: regular rate and rhythm, S1, S2 normal, no murmur, click, rub or gallop Extremities: No LEE Pulses: 2+ and symmetric No hematoma at R radial cath site Neurologic: Grossly normal  Lab Results:   Recent Labs  07/20/12 0851 07/21/12 0440  WBC 6.4 5.4  HGB 13.6 11.7*  HCT 39.4 34.7*  PLT 350 294   BMET  Recent Labs  07/20/12 0851 07/21/12 0440  NA 139 140  K 3.9 3.8  CL 101 106  CO2 24 26  GLUCOSE 163* 146*  BUN 19 14  CREATININE 1.09 0.95  CALCIUM 9.6 8.8   PT/INR  Recent Labs  07/20/12 0851  LABPROT 13.8  INR 1.07   MEDICATIONS:  Anesthesia: Local Lidocaine 2 ml Sedation: 2 mg IV Versed, 100 mcg IV fentanyl ;  Premedication: 5 mg PO Valium Omnipaque Contrast: 240 ml  Anticoagulation: IV  Angiomax Bolus & drip  Anti-Platelet Agent: Brilinta 180mg  Hemodynamics:  Central Aortic: 110/60 mmHg;  Left Ventricular Pressures / EDP: 108/8 mmHg; 14 mmHg Coronary Anatomy:  Left Main: Large caliber vessel that trifurcates into the LAD, Ramus Intermedius and Circumflex; angiographically normal LAD: Moderate to large caliber vessel with a proximal Septal perforator trunk and a moderate to large caliber proximal Diagonal 1 (D1) that is at least the same caliber as the proximal LAD. The follow on LAD has faint filling through a chronic 99-100% "sub-total" occlusion with to-and-fro antegrade flow with competitive flow from L-L collaterals. The LAD actually wraps the entire apex to the inferoapex, there are several septal perforators and smaller diagonal branches.  D1: Moderate to large caliber, bifurcating vessel with minimal luminal irregularities.  D2: post PCI, there is a small caliber vessel Left Circumflex: Moderate to large caliber vessel with a major lateral OM branch; angiographically normal  Ramus intermedius: Small to moderate caliber vessel that bifurcates distally; angiographically normal.  RCA: Known to be occluded. Percutaneous Coronary Intervention:  Guide: 6 Fr XB LAD Guidewire: Whisper -- used initial pre-dilation balloon for support.  Predilation Balloon: Sprinter Legend 1.5 mm x 12 mm;  6 Atm x 35 Sec (LAD-from diag "ostium), 5 Atm x 30 Sec (proximal follow-on LAD)  AngioSculpt "Scoring" Balloon: 2.0 mm x 10 mm -- multiple inflations in the "ostial" and proximal follow-on LAD  "ostial" - 4 Atm x 36 Sec, "proximal" - 4 Atm x 45 Sec,6 Atm x 45 Sec, "ostial" - 8 Atm x 45 Sec "ostial" - 14 Atm x 60 Sec, 16 Atm x 45 Sec; "proximal" - 10 Atm x 60 Sec -- up to 2.28 mm in Ostium and 2.18 mm proximal Stent: Promus Premier 2.25 mm x 20 mm;  12 Atm x 45 Sec - nominal diameter 2.25 mm Post-dilation Balloon: [place] Sprinter 2.5 mm x 15 mm;  Distal stent for Proximal "follow-on LAD" -- 12 Atm  x 45 Sec -- 2.55 mm final diameter (distal 4 mm of stent avoided)  16 Atm x 45 Sec, 18 Atm x 45 Sec Final Diameter across D1 into main LAD: 2.65 mm Post deployment angiography in multiple views, with and without guidewire in place revealed excellent stent  deployment and lesion coverage. There was no evidence of dissection or perforation.  PATIENT DISPOSITION:  The patient was transferred to the PACU holding area in a hemodynamicaly stable, chest pain free condition.  The patient tolerated the procedure well, and there were no complications. EBL: < 5 ml  The patient was stable before, during, and after the procedure. POST-OPERATIVE DIAGNOSIS:  Successful, complex Percutaneous Revascularization of the Chronic "subtotally" occluded LAD with focal stenosis at the "ostium" of the follow-on LAD after D1 with ultimate placement of a Promus Premier DES 2.25 mm x 20 mm, post-dilated in a tapered fashion to 2.65 mm in the proximal LAD crossing D2 and 2.5 mm in the mid stent.  The mid and distal LAD is a small caliber vessel with diffuse disease, most notably a severe distal ~70-80% lesion just prior to the apex.  Normal LVEDP PLAN OF CARE:  Standard post-radial cath care  DAPT x minium of 1 year - will hold Aggrenox for now  Continue current Cardiac medications.  ROV with either me of Nada Boozer, NP next week. Marykay Lex, M.D., M.S.  [facility]  Assessment/Plan   Principal Problem:   CAD (coronary artery disease), LAD ansd RCA disease, to proceed wtih intervention. Active Problems:   DM (diabetes mellitus)   HTN (hypertension)   NSTEMI (non-ST elevated myocardial infarction), 07/07/12   Obesity   Tobacco use disorder  Plan:  S/P PCI to the subtotally occluded LAD with a Promus DES placed at the ostium.  The mid to distal LAD has a severe 70-80% lesion but is a small caliber vessel at the apex.   Doing well.  ASA, brilinta, ranexa, lipitor, imdur, lopressor,  lisinopril.  DC home today.     LOS: 1 day    HAGER, BRYAN 07/21/2012 8:19 AM   Patient seen and examined. Agree with assessment and plan. Excellent result angiographically. Doing well; no chest pain of sob. Will DC today.   Lennette Bihari, MD, [facility] 07/21/2012 8:42 AM

## 2012-07-21 NOTE — Progress Notes (Signed)
CARDIAC REHAB PHASE I   PRE:  Rate/Rhythm: 62 SR    BP: sitting 139/75    SaO2:   MODE:  Ambulation: 1000 ft   POST:  Rate/Rhythm: 93 SR    BP: sitting 159/97, 156/87 10 min later     SaO2:   Tolerated well, no c/o. BP elevated after walk. Ed reviewed. Pt awaiting call from [facility]. Encouraged pt to call them if she doesn't hear from them in 1 more week. To increase ex slowly at home. Will probably buy 14 mg nicotine patch since abstaining from cigarettes has been difficult last 2 weeks. [phone]  Elissa Lovett [place] CES, ACSM 07/21/2012 8:24 AM

## 2012-07-22 LAB — URINE CULTURE

## 2012-07-22 NOTE — Discharge Summary (Signed)
Physician Discharge Summary  Patient ID: Kimberly Ramos MRN: [id number] DOB/AGE: [birth year] 42 y.o.  Admit date: 07/20/2012 Discharge date: 07/21/2012  Admission Diagnoses:  Discharge Diagnoses:  Principal Problem:   CAD (coronary artery disease), LAD ansd RCA disease, to proceed wtih intervention. Active Problems:   DM (diabetes mellitus)   HTN (hypertension)   NSTEMI (non-ST elevated myocardial infarction), 07/07/12   Obesity   Tobacco use disorder   Presence of drug coated stent in LAD coronary artery - From Prox LAD, across D1 into previously Chronically "subtotal" occluded mid LAD   Discharged Condition: stable  Hospital Course:   42 y.o. female who is morbidly obese with a history of CAD, DM, stroke as well as hypertension, followed by Dr. Herbie Baltimore, with previous cath in 2010 that revealed a nearly complete blocked RCA, however she had adequate supply of collaterals and was treated medically. She presented to the [facility] ED 07/04/12 with a complaint of substernal chest pressure. She has never had this type of pain before. She was at the eye doctor's office when she developed nausea with vomiting. She was diaphoretic and developed chest pressure which radiated to her right shoulder. She had mild SOB. The pain was non-pleuritic, non-positional, non-exertional and 6/10. She left the eye doctor and when to her PCP office, which was across the street. There she was given SL NTG x 1 and had immediate relief.   She was admitted on 07/04/2012 with elevated Troponin POC at 0.80, follow up troponin 2.08. EKG showed subtle ST changes in the anterolateral leads, but no T wave changes. Peak troponin 2.21 by AM and she underwent cardiac cath. Results:   Ao: 167/91  LV: 167/22  LM: nl  LAD: subtotal occlusion after S1 and before large bifurcating DX vessel, with late filling of LAD to apex (This apparently was present on 2010 angios but was not recognized;therefore, not new)  RI: nl  LCX:Nl with L to R  collaterals  RCA: old subtotal proximal stenosis with slightly improved antegrade filling and distal filling with right to left and right to right collaterals.  LV FXN: EF 55% with mild inferior hypokinesis.   Plan was medical therapy and outpatient stress test which she underwent 07/15/12.  Comparison with Prior Nuclear Study: No significant change from previous study. Review of her NST in 2012 showed a large area of anterior ischemia which is near identical to this study.  Overall Impression: High risk stress nuclear study. Large area of anterior and septal ischemia which is almost totally reversible. Based on recent catheterization findings, a revascularization attempt of the anterior wall is advisable.  LV Wall Motion: NL LV Function; NL Wall Motion; EF 57%.  Dr. Rennis Golden and Dr. Herbie Baltimore reviewed and believe it would benefit the pt. To proceed with PCI to the LAD.  The results of the test were discussed along with the benefits that proceeding with PCI may bring. Pt and her husband were agreeable with proceeding.  She had one episode last pm of chest pain, lasting 5 min. Resolved without NTG. No other episodes since discharge. She has stopped smoking,  She underwent Successful, complex Percutaneous Revascularization of the Chronic "subtotally" occluded LAD with focal stenosis at the "ostium" of the follow-on LAD after D1 with ultimate placement of a Promus Premier DES 2.25 mm x 20 mm, post-dilated in a tapered fashion to 2.65 mm in the proximal LAD crossing D2 and 2.5 mm in the mid stent. The mid and distal LAD is a small caliber vessel  with diffuse disease, most notably a severe distal ~70-80% lesion just prior to the apex. Normal LVEDP.  The patient was discharged home in stable condition after being seen by Dr. Tresa Endo.    Consults: None  Significant Diagnostic Studies:  Hemodynamics:  Central Aortic: 110/60 mmHg;  Left Ventricular Pressures / EDP: 108/8 mmHg; 14 mmHg Coronary Anatomy:  Left Main:  Large caliber vessel that trifurcates into the LAD, Ramus Intermedius and Circumflex; angiographically normal LAD: Moderate to large caliber vessel with a proximal Septal perforator trunk and a moderate to large caliber proximal Diagonal 1 (D1) that is at least the same caliber as the proximal LAD. The follow on LAD has faint filling through a chronic 99-100% "sub-total" occlusion with to-and-fro antegrade flow with competitive flow from L-L collaterals. The LAD actually wraps the entire apex to the inferoapex, there are several septal perforators and smaller diagonal branches.  D1: Moderate to large caliber, bifurcating vessel with minimal luminal irregularities.  D2: post PCI, there is a small caliber vessel Left Circumflex: Moderate to large caliber vessel with a major lateral OM branch; angiographically normal  Ramus intermedius: Small to moderate caliber vessel that bifurcates distally; angiographically normal.  RCA: Known to be occluded. Percutaneous Coronary Intervention:  Guide: 6 Fr XB LAD Guidewire: Whisper -- used initial pre-dilation balloon for support.  Predilation Balloon: Sprinter Legend 1.5 mm x 12 mm;  6 Atm x 35 Sec (LAD-from diag "ostium), 5 Atm x 30 Sec (proximal follow-on LAD)  AngioSculpt "Scoring" Balloon: 2.0 mm x 10 mm -- multiple inflations in the "ostial" and proximal follow-on LAD  "ostial" - 4 Atm x 36 Sec, "proximal" - 4 Atm x 45 Sec,6 Atm x 45 Sec, "ostial" - 8 Atm x 45 Sec "ostial" - 14 Atm x 60 Sec, 16 Atm x 45 Sec; "proximal" - 10 Atm x 60 Sec -- up to 2.28 mm in Ostium and 2.18 mm proximal Stent: Promus Premier 2.25 mm x 20 mm;  12 Atm x 45 Sec - nominal diameter 2.25 mm Post-dilation Balloon: [place] Sprinter 2.5 mm x 15 mm;  Distal stent for Proximal "follow-on LAD" -- 12 Atm x 45 Sec -- 2.55 mm final diameter (distal 4 mm of stent avoided)  16 Atm x 45 Sec, 18 Atm x 45 Sec Final Diameter across D1 into main LAD: 2.65 mm Post deployment angiography in multiple  views, with and without guidewire in place revealed excellent stent deployment and lesion coverage. There was no evidence of dissection or perforation.  PATIENT DISPOSITION:  The patient was transferred to the PACU holding area in a hemodynamicaly stable, chest pain free condition.  The patient tolerated the procedure well, and there were no complications. EBL: < 5 ml  The patient was stable before, during, and after the procedure. POST-OPERATIVE DIAGNOSIS:  Successful, complex Percutaneous Revascularization of the Chronic "subtotally" occluded LAD with focal stenosis at the "ostium" of the follow-on LAD after D1 with ultimate placement of a Promus Premier DES 2.25 mm x 20 mm, post-dilated in a tapered fashion to 2.65 mm in the proximal LAD crossing D2 and 2.5 mm in the mid stent.  The mid and distal LAD is a small caliber vessel with diffuse disease, most notably a severe distal ~70-80% lesion just prior to the apex.  Normal LVEDP PLAN OF CARE:  Standard post-radial cath care  DAPT x minium of 1 year - will hold Aggrenox for now  Continue current Cardiac medications.  ROV with either me of Nada Boozer, NP  next week. Marykay Lex, M.D., M.S.  [facility]  BMET    Component Value Date/Time   NA 140 07/21/2012 0440   K 3.8 07/21/2012 0440   CL 106 07/21/2012 0440   CO2 26 07/21/2012 0440   GLUCOSE 146* 07/21/2012 0440   BUN 14 07/21/2012 0440   CREATININE 0.95 07/21/2012 0440   CALCIUM 8.8 07/21/2012 0440   GFRNONAA 73* 07/21/2012 0440   GFRAA 85* 07/21/2012 0440    CBC    Component Value Date/Time   WBC 5.4 07/21/2012 0440   RBC 3.77* 07/21/2012 0440   HGB 11.7* 07/21/2012 0440   HCT 34.7* 07/21/2012 0440   PLT 294 07/21/2012 0440   MCV 92.0 07/21/2012 0440   MCH 31.0 07/21/2012 0440   MCHC 33.7 07/21/2012 0440   RDW 13.0 07/21/2012 0440   LYMPHSABS 1.8 07/04/2012 1507   MONOABS 0.8 07/04/2012 1507   EOSABS 0.0 07/04/2012 1507   BASOSABS 0.0 07/04/2012 1507      Treatments: See Above  Discharge Exam: Blood pressure 142/86, pulse 68, temperature 98 F (36.7 C), temperature source Oral, resp. rate 18, height 5\' 6"  (1.676 m), weight 127.7 kg (281 lb 8.4 oz), SpO2 98.00%.   Disposition: 01-Home or Self Care  Discharge Orders   Future Appointments Provider Department Dept Phone   09/21/2012 9:00 AM Myeong Eyvonne Left [facility] [phone]   Future Orders Complete By Expires     Diet - low sodium heart healthy  As directed     Discharge instructions  As directed     Comments:      No lifting with your right hand for three days.    Increase activity slowly  As directed         Medication List    STOP taking these medications       dipyridamole-aspirin 200-25 MG per 12 hr capsule  Commonly known as:  AGGRENOX      TAKE these medications       acetaminophen 325 MG tablet  Commonly known as:  TYLENOL  Take 650 mg by mouth every 4 (four) hours as needed for pain.     albuterol 108 (90 BASE) MCG/ACT inhaler  Commonly known as:  PROVENTIL HFA;VENTOLIN HFA  Inhale 2 puffs into the lungs every 6 (six) hours as needed for wheezing.     amLODipine 2.5 MG tablet  Commonly known as:  NORVASC  Take 2.5 mg by mouth daily.     aspirin EC 81 MG tablet  Take 81 mg by mouth daily.     atorvastatin 40 MG tablet  Commonly known as:  LIPITOR  Take 40 mg by mouth daily.     cyclobenzaprine 10 MG tablet  Commonly known as:  FLEXERIL  Take 10 mg by mouth 3 (three) times daily as needed for muscle spasms.     EPIPEN 0.3 mg/0.3 mL Devi  Generic drug:  EPINEPHrine  Inject 0.3 mg into the muscle once.     ezetimibe 10 MG tablet  Commonly known as:  ZETIA  Take 10 mg by mouth daily.     furosemide 40 MG tablet  Commonly known as:  LASIX  Take 40 mg by mouth daily.     insulin aspart protamine-insulin aspart (70-30) 100 UNIT/ML injection  Commonly known as:  NOVOLOG 70/30  Inject 30 Units into the skin 2 (two) times daily  with a meal.     isosorbide mononitrate 60 MG 24 hr tablet  Commonly known as:  IMDUR  Take 1 tablet (60 mg total) by mouth daily.     lisinopril 40 MG tablet  Commonly known as:  PRINIVIL,ZESTRIL  Take 40 mg by mouth daily.     metoprolol 50 MG tablet  Commonly known as:  LOPRESSOR  Take 50 mg by mouth 2 (two) times daily.     nicotine 14 mg/24hr patch  Commonly known as:  NICODERM CQ - dosed in mg/24 hours  Place 1 patch onto the skin daily.     nitroGLYCERIN 0.4 MG/SPRAY spray  Commonly known as:  NITROLINGUAL  Place 1 spray under the tongue every 5 (five) minutes as needed for chest pain.     ranolazine 500 MG 12 hr tablet  Commonly known as:  RANEXA  Take 1 tablet (500 mg total) by mouth 2 (two) times daily.     sitaGLIPtan-metformin 50-500 MG per tablet  Commonly known as:  JANUMET  Take 1 tablet by mouth 2 (two) times daily with a meal.     solifenacin 10 MG tablet  Commonly known as:  VESICARE  Take 10 mg by mouth daily as needed (for bladder).     Ticagrelor 90 MG Tabs tablet  Commonly known as:  BRILINTA  Take 1 tablet (90 mg total) by mouth 2 (two) times daily.     traMADol 50 MG tablet  Commonly known as:  ULTRAM  Take 50 mg by mouth every 6 (six) hours as needed for pain.           Follow-up Information   Follow up with Lashanda Storlie, PA-C. (Our office will call with you with the appt date and time.)    Contact information:   [address] [place] [address] [phone]       Signed: Wilburt Finlay 07/22/2012, 8:28 AM

## 2012-08-07 ENCOUNTER — Encounter: Payer: Self-pay | Admitting: *Deleted

## 2012-08-10 ENCOUNTER — Encounter: Payer: Self-pay | Admitting: Cardiology

## 2012-09-06 ENCOUNTER — Encounter: Payer: Self-pay | Admitting: Cardiology

## 2012-09-06 ENCOUNTER — Ambulatory Visit (INDEPENDENT_AMBULATORY_CARE_PROVIDER_SITE_OTHER): Payer: Medicare Other | Admitting: Cardiology

## 2012-09-06 VITALS — BP 100/70 | HR 68 | Ht 66.0 in | Wt 278.7 lb

## 2012-09-06 DIAGNOSIS — E669 Obesity, unspecified: Secondary | ICD-10-CM

## 2012-09-06 DIAGNOSIS — E785 Hyperlipidemia, unspecified: Secondary | ICD-10-CM

## 2012-09-06 DIAGNOSIS — R42 Dizziness and giddiness: Secondary | ICD-10-CM

## 2012-09-06 DIAGNOSIS — E119 Type 2 diabetes mellitus without complications: Secondary | ICD-10-CM

## 2012-09-06 DIAGNOSIS — F17200 Nicotine dependence, unspecified, uncomplicated: Secondary | ICD-10-CM

## 2012-09-06 DIAGNOSIS — I2510 Atherosclerotic heart disease of native coronary artery without angina pectoris: Secondary | ICD-10-CM

## 2012-09-06 DIAGNOSIS — I10 Essential (primary) hypertension: Secondary | ICD-10-CM

## 2012-09-06 NOTE — Assessment & Plan Note (Signed)
Very encouraging results following PCI. Due to the follow her very closely as I am concerned that with increased flow down the LAD the size that may increase to a size greater than would be a stent allows it to be.  We'll need to check stress test in 1 year. Continue dual antiplatelet therapy for a minimum of 1 year.

## 2012-09-06 NOTE — Assessment & Plan Note (Signed)
Followed by primary care provider 

## 2012-09-06 NOTE — Assessment & Plan Note (Signed)
She is successfully obtained for smoking since the time of her heart attack in March. I congratulated her current efforts. I would suspect that this may have some into with her difficulty with weight. Hopefully as she is further away from her addiction, she will be able to lose weight when her cravings decrease.

## 2012-09-06 NOTE — Patient Instructions (Addendum)
I am very excited about how well you're doing after your stent placement. I'm very sided to see that you're doing well in cardiac rehabilitation. Next  Configured dizziness and tiredness may be partly related to your low blood pressures. We discussed adjusting your medications as described:  Stop taking amlodipine and monitor blood pressures. If your pressures continued to be less than 120/80 mmHg , reduce lisinopril to 20 mg daily. We will check your cholesterol panel in roughly 2 months, then you will followup with Kimberly Ramos angled, NP in roughly 3 months.  Continue with cardiac rehabilitation, focusing on dietary modifications. Following completion of rehabilitation, if urine should begin refer you to and additional nutritionist to assist you.  I will follow up with you roughly 6 months after you see Kimberly Ramos. We'll consider rechecking a nuclear stress test in roughly one year.  It is ok for you to return to have your Eye Exam.

## 2012-09-06 NOTE — Assessment & Plan Note (Signed)
Is well-controlled will actually decrease her regimen. Discontinuing amlodipine. Would also consider cutting December by one half if she continues to be dizzy with blood pressures in the 110s while off amlodipine.

## 2012-09-06 NOTE — Progress Notes (Signed)
Patient ID: Kimberly Ramos, female   DOB: 1970-11-01, 42 y.o.   MRN: [id number] [facility]   Clinic Note: HPI: Kimberly Ramos is a 42 y.o. female with a PMH below who presents today for followup after her LAD PCI.Marland Kitchen She comes in today very excited at the fact that she is now able to do some activities it should not previous able to pierce able to park in the parking lot will call the way to the care of Dr. of the stores without getting short of breath or nauseated or chest tightness. She is doing well with cardiac rehabilitation without any complications. Her only concerns are some dizziness and fatigue after long-term exercise. She still takes her Lasix as prescribed and with that denies any edema. She denies any heart failure symptoms of PND or orthopnea. We had stopped her Ranexa do to some side effects. Since then she's doing quite well.  She is successfully quit smoking now for almost 2 full months, and is very excited about this. Her only concern is that she is gaining weight, as opposed to llosing weight.  Cardiovascular ROS: no chest pain or dyspnea on exertion positive for - general fatigue negative for - chest pain, dyspnea on exertion, edema, irregular heartbeat, loss of consciousness, murmur, orthopnea, palpitations, paroxysmal nocturnal dyspnea, rapid heart rate, shortness of breath or no TIA, Amaurosis fugax symptoms, no lightheadedness, syncope or near syncope  Allergies  Allergen Reactions  . Ciprofloxacin Hives and Swelling    Current Outpatient Prescriptions  Medication Sig Dispense Refill  . acetaminophen (TYLENOL) 325 MG tablet Take 650 mg by mouth every 4 (four) hours as needed for pain.      Marland Kitchen albuterol (PROVENTIL HFA;VENTOLIN HFA) 108 (90 BASE) MCG/ACT inhaler Inhale 2 puffs into the lungs every 6 (six) hours as needed for wheezing.      Marland Kitchen aspirin EC 81 MG tablet Take 81 mg by mouth daily.      Marland Kitchen atorvastatin (LIPITOR) 40 MG tablet Take 40 mg by  mouth daily.      . Cetirizine HCl (ZYRTEC ALLERGY PO) Take by mouth.      . cyclobenzaprine (FLEXERIL) 10 MG tablet Take 10 mg by mouth 3 (three) times daily as needed for muscle spasms.      Marland Kitchen EPINEPHrine (EPIPEN) 0.3 mg/0.3 mL DEVI Inject 0.3 mg into the muscle once.      . ezetimibe (ZETIA) 10 MG tablet Take 10 mg by mouth daily.      . fluticasone (FLONASE) 50 MCG/ACT nasal spray Place 2 sprays into the nose daily as needed for rhinitis.      . furosemide (LASIX) 40 MG tablet Take 40 mg by mouth daily.      . insulin aspart protamine-insulin aspart (NOVOLOG 70/30) (70-30) 100 UNIT/ML injection Inject 30 Units into the skin 2 (two) times daily with a meal.      . isosorbide mononitrate (IMDUR) 60 MG 24 hr tablet Take 1 tablet (60 mg total) by mouth daily.  30 tablet  11  . lisinopril (PRINIVIL,ZESTRIL) 40 MG tablet Take 40 mg by mouth daily.      . metoprolol (LOPRESSOR) 50 MG tablet Take 50 mg by mouth 2 (two) times daily.      . nitroGLYCERIN (NITROLINGUAL) 0.4 MG/SPRAY spray Place 1 spray under the tongue every 5 (five) minutes as needed for chest pain.  12 g  3  . sitaGLIPtan-metformin (JANUMET) 50-500 MG per tablet Take 1 tablet  by mouth 2 (two) times daily with a meal.      . solifenacin (VESICARE) 10 MG tablet Take 10 mg by mouth daily as needed (for bladder).       . Ticagrelor (BRILINTA) 90 MG TABS tablet Take 1 tablet (90 mg total) by mouth 2 (two) times daily.  60 tablet  10  . BD INSULIN SYRINGE ULTRAFINE 31G X 5/16" 0.3 ML MISC       . HYDROcodone-acetaminophen (NORCO) 7.5-325 MG per tablet       . nicotine (NICODERM CQ - DOSED IN MG/24 HOURS) 14 mg/24hr patch Place 1 patch onto the skin daily.  28 patch  1  . QVAR 40 MCG/ACT inhaler       . ranolazine (RANEXA) 500 MG 12 hr tablet Take 1 tablet (500 mg total) by mouth 2 (two) times daily.  60 tablet  11  . traMADol (ULTRAM) 50 MG tablet Take 50 mg by mouth every 6 (six) hours as needed for pain.       No current  facility-administered medications for this visit.    Past Medical History  Diagnosis Date  . Stroke   . Tobacco use disorder 07/07/2012  . NSTEMI (non-ST elevated myocardial infarction)   . CAD (coronary artery disease)     2 vessel disease old sub totaled occ. of RCA and subtotal occ. of LAD  . Anginal pain   . Hypertension   . Asthma   . Sleep apnea   . Diabetes mellitus without complication   . GERD (gastroesophageal reflux disease)   . H/O hiatal hernia   . Arthritis   . OSA on CPAP   . Presence of drug coated stent in LAD coronary artery 06/2012    for chronic subtotal LAD occlusion    Past Surgical History  Procedure Laterality Date  . Cardiac catheterization  07/05/2012    07/05/12  + CAD  . Carpel tunnel    . Coronary angioplasty with stent placement  07/20/2012    Subtotalled ostium of LAD at D1      Dr Herbie Baltimore - Promus DES 2.25 mm x 20 mm - post dilated to 2.65 across D1 & 2.5 in mid stent  . Myocardial perfusion imaging  07/15/2012    EF 57% lge area ant and septal ischemia  . Cardiac catheterization  [id number]    History   Social History  . Marital Status: Married    Spouse Name: N/A    Number of Children: N/A  . Years of Education: N/A   Occupational History  . Not on file.   Social History Main Topics  . Smoking status: Former Smoker    Quit date: 07/04/2012  . Smokeless tobacco: Never Used  . Alcohol Use: Yes     Comment: occasional  . Drug Use: No  . Sexually Active: Not on file   Other Topics Concern  . Not on file   Social History Narrative  . No narrative on file    ROS: Comprehensive Review of Systems - Negative with exception pertinent positives noted above. PHYSICAL EXAM BP 100/70  Pulse 68  Ht 5\' 6"  (1.676 m)  Wt 278 lb 11.2 oz (126.417 kg)  BMI 45 kg/m2 General appearance: alert, cooperative, appears stated age and morbidly obese Neck: no adenopathy, no carotid bruit, no JVD, supple, symmetrical, trachea midline and thyroid not  enlarged, symmetric, no tenderness/mass/nodules Lungs: clear to auscultation bilaterally, normal percussion bilaterally and Nonlabored Heart: regular rate and rhythm, S1, S2  normal, no murmur, click, rub or gallop Abdomen: abnormal findings:  obese and Unable to palpate any hepatomegaly due to body habitus.  Soft/NT/NP/NA BS Extremities: extremities normal, atraumatic, no cyanosis or edema Pulses: 2+ and symmetric Neurologic: Mental status: Alert, oriented, thought content appropriate, Normal mood and affect  WGN:[id number] today: No  ASSESSMENT: Notable improvement in overall functional status. She is able to dissipated her cardiac rehabilitation without any difficulty. My main concern is that she is continued to increase her weight as opposed decrease weight.  Patient Active Problem List   Diagnosis Date Noted  . CAD (coronary artery disease), LAD and 100% occluded RCA  -- PCI to Subtotal LAD CTO 07/04/12 DES stetn. 07/04/2012    Priority: High  . Tobacco use disorder 07/07/2012    Priority: Medium  . DM (diabetes mellitus) 07/04/2012    Priority: Medium  . HTN (hypertension) 07/04/2012    Priority: Medium  . Obesity 07/04/2012    Priority: Medium  . Presence of drug coated stent in LAD coronary artery - From Prox LAD, across D1 into previously Chronically "subtotal" occluded mid LAD 07/21/2012    Priority: Low  . Postural dizziness 09/06/2012  . NSTEMI (non-ST elevated myocardial infarction), 07/07/12 07/04/2012    PLAN: Orders Placed This Encounter  Procedures  . Lipid Profile    Standing Status: Future     Number of Occurrences: 1     Standing Expiration Date: 09/06/2013    Order Specific Question:  Has the patient fasted?    Answer:  Yes  . COMPLETE METABOLIC PANEL WITH GFR    Standing Status: Future     Number of Occurrences: 1     Standing Expiration Date: 09/06/2013   Followup: 3 months with Nada Boozer, NP; With me 6 months after  Marykay Lex, M.D., M.S. [facility] [address] [place], [place]  [address]  [phone] Pager # [phone] 09/06/2012 6:17 PM

## 2012-09-06 NOTE — Assessment & Plan Note (Signed)
Discontinue amlodipine. If blood pressure continued to be less than 120/80 mmHg, cut lisinopril 20 mg daily

## 2012-09-06 NOTE — Assessment & Plan Note (Addendum)
She is currently to infiltrate into the nutritional consultation during her cardiac rehabilitation. Hopefully with her exercise level increasing and her dietary modification she will be to lose weight. Is in order to keep this up post cardiac rehabilitation, I will strongly consider referring her to [facility]. Her goal is to lose 10% of her body weight per year.

## 2012-09-16 NOTE — Progress Notes (Signed)
Can we check to see if she had had her labs checked?  Marykay Lex, MD

## 2012-09-21 ENCOUNTER — Encounter: Payer: Self-pay | Admitting: Podiatry

## 2012-09-21 ENCOUNTER — Ambulatory Visit (INDEPENDENT_AMBULATORY_CARE_PROVIDER_SITE_OTHER): Payer: Medicare Other | Admitting: Podiatry

## 2012-09-21 ENCOUNTER — Ambulatory Visit: Payer: Self-pay | Admitting: Podiatry

## 2012-09-21 VITALS — BP 113/66 | HR 60

## 2012-09-21 DIAGNOSIS — L84 Corns and callosities: Secondary | ICD-10-CM

## 2012-09-21 DIAGNOSIS — E119 Type 2 diabetes mellitus without complications: Secondary | ICD-10-CM

## 2012-09-21 DIAGNOSIS — B353 Tinea pedis: Secondary | ICD-10-CM

## 2012-09-21 NOTE — Progress Notes (Signed)
Subjective: 42 Y/O IDDM female presents stating that she has had heart attach in March 2014, but doing well since then.  She is not experiencing short of breath since they put stent in heart.  Blood sugar is under control. Last HgA1c was 7 in December. She also had a stroke in 2000 that left her with weak right side leg and arm. Right foot pain when walk. 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  Pt. Is picking up Diabetic shoes. Hoping to walk more.   Objective: Positive of mild plantar callus under the 5th MPJ left foot. Dry flaky skin plantar surface bilateral. No other lesions noted.  Pedal pulses are palpable on Dorsalis Pedis, and Posterior Tibial pulses were not palpable.  Assessment: IDDM Plantar callus left. Tinea Pedis plantar bilateral.  Plan:  Sub 5 left debrided. Diabetic shoe dispensed.  Advised to use Salsun blue shampoo and scrub bottom of both feet after each shower.  Return as needed.

## 2012-11-17 LAB — COMPLETE METABOLIC PANEL WITH GFR
ALT: 14 U/L (ref 0–35)
AST: 16 U/L (ref 0–37)
Albumin: 3.7 g/dL (ref 3.5–5.2)
CO2: 25 mEq/L (ref 19–32)
Calcium: 8.7 mg/dL (ref 8.4–10.5)
Chloride: 108 mEq/L (ref 96–112)
GFR, Est African American: 88 mL/min
Potassium: 4.8 mEq/L (ref 3.5–5.3)
Total Protein: 6.6 g/dL (ref 6.0–8.3)

## 2012-11-17 LAB — LIPID PANEL
LDL Cholesterol: 55 mg/dL (ref 0–99)
VLDL: 13 mg/dL (ref 0–40)

## 2012-11-18 ENCOUNTER — Telehealth: Payer: Self-pay | Admitting: *Deleted

## 2012-11-18 NOTE — Telephone Encounter (Signed)
Message copied by Tobin Chad on Fri Nov 18, 2012  8:40 AM ------      Message from: [facility], DAVID      Created: Thu Nov 17, 2012 10:58 PM       Labs look great - only blood glucose is high -- the rest look good.      Marykay Lex, MD       ------

## 2012-11-18 NOTE — Telephone Encounter (Signed)
Message left that labs were good. Continue with current medication

## 2012-11-18 NOTE — Telephone Encounter (Signed)
Mrs. Pryer returned your call

## 2012-12-23 ENCOUNTER — Ambulatory Visit: Payer: Medicare Other | Admitting: Podiatry

## 2013-01-03 ENCOUNTER — Encounter: Payer: Self-pay | Admitting: Cardiology

## 2013-01-03 ENCOUNTER — Ambulatory Visit (INDEPENDENT_AMBULATORY_CARE_PROVIDER_SITE_OTHER): Payer: Medicare Other | Admitting: Cardiology

## 2013-01-03 ENCOUNTER — Telehealth: Payer: Self-pay | Admitting: Cardiology

## 2013-01-03 VITALS — BP 128/84 | HR 70 | Ht 67.0 in | Wt 289.4 lb

## 2013-01-03 DIAGNOSIS — I10 Essential (primary) hypertension: Secondary | ICD-10-CM

## 2013-01-03 DIAGNOSIS — E119 Type 2 diabetes mellitus without complications: Secondary | ICD-10-CM

## 2013-01-03 DIAGNOSIS — F17200 Nicotine dependence, unspecified, uncomplicated: Secondary | ICD-10-CM

## 2013-01-03 DIAGNOSIS — I2510 Atherosclerotic heart disease of native coronary artery without angina pectoris: Secondary | ICD-10-CM

## 2013-01-03 DIAGNOSIS — E669 Obesity, unspecified: Secondary | ICD-10-CM

## 2013-01-03 NOTE — Assessment & Plan Note (Signed)
Followed by PCP, but she stated it was improving- HgA1C

## 2013-01-03 NOTE — Telephone Encounter (Signed)
CALLED NUMBER [facility]  ONLY HAVE RECORD OF LABS DRAWN 08/2012  NONE FROM [id number]. INFORMED PATIENT,SHE STATES SHE DID HAVE LABS DONE .   WILL INVESTIGATE SOME MORE WITH HER PAPER CHART. PATIENT AWARE

## 2013-01-03 NOTE — Assessment & Plan Note (Signed)
Has stopped smoking  

## 2013-01-03 NOTE — Assessment & Plan Note (Signed)
No chest pain, no SOB.  Had been attending Cardiac Rehab until her knee would not allow exercise.  She plans to return once knee pain improved.

## 2013-01-03 NOTE — Telephone Encounter (Signed)
Pt is calling in regards to blood work that she had done. She was calling to give you a number to retreive her results [phone] option 2.

## 2013-01-03 NOTE — Progress Notes (Signed)
01/03/2013   PCP: Jackie Plum, MD   Chief Complaint  Patient presents with  . 3 month visit    no chest pain, little fluttering every now then, no sob , little swelling, sometime miss night medication, schedule for knee injection 9/23    Primary Cardiologist:Dr. Herbie Baltimore   HPI: 42 year old African American female with a history of coronary artery disease, diabetes mellitus, stroke, hypertension and who is morbidly obese.  Cardiac cath in 2010 that revealed a nearly complete blocked RCA, however she had adequate supply of collaterals and was treated medically. She presented to the [facility] ED 07/04/12 with NSTEMI, cardiac cath at that time refill to subtotal occlusion of the LAD after S1 and EF 55% was felt best to treat medically.  Outpatient nuclear stress test was obtained and it considered high risk nuclear study. She was brought back into the hospital electively for intervention which consisted of placement of a Promus Premier DES 2.25 mm x 20 mm, post-dilated in a tapered fashion to 2.65 mm in the proximal LAD crossing D2 and 2.5 mm in the mid stent. The mid and distal LAD is a small caliber vessel with diffuse disease, most notably a severe distal ~70-80% lesion just prior to the apex.  Her symptoms improved dramatically after this intervention. She had more energy was able to walk further without problems.  She continues to do well without chest pain, without shortness of breath. She does have occasional heart flutter very brief one or 2 seconds only 2 episodes. Her diabetes is improved with decreasing hemoglobin A1c. Her asthma has been stable. Her last labs that I have today reveal an LDL of 55.  She has had labs since that time and we will receive results later today.  Her greatest problem is right knee pain she is to have it injected the orthopedist and have point. Due to this pain she's had to cut back on her cardiac rehabilitation program she has 3 more sessions to go what she  can do at a later date. She has bought a treadmill as well with her sister walking we discussed treadmills actually cause more knee problems but walking on the street so good and perhaps water aerobics would be a good choice for her we also discussed nutritionist she has seen a diabetic nutritionist in [facility] before and she didn't think it was helpful I gave her any headache or scar and on or she could return to the person she seen before.  She also heard talks by the nutritionist through cardiac rehabilitation.    Allergies  Allergen Reactions  . Ciprofloxacin Hives and Swelling    Current Outpatient Prescriptions  Medication Sig Dispense Refill  . albuterol (PROVENTIL HFA;VENTOLIN HFA) 108 (90 BASE) MCG/ACT inhaler Inhale 2 puffs into the lungs every 6 (six) hours as needed for wheezing.      Marland Kitchen aspirin EC 81 MG tablet Take 81 mg by mouth daily.      Marland Kitchen atorvastatin (LIPITOR) 40 MG tablet Take 40 mg by mouth daily.      . BD INSULIN SYRINGE ULTRAFINE 31G X 5/16" 0.3 ML MISC       . Cetirizine HCl (ZYRTEC ALLERGY PO) Take by mouth.      . cyclobenzaprine (FLEXERIL) 10 MG tablet Take 10 mg by mouth 3 (three) times daily as needed for muscle spasms.      Marland Kitchen EPINEPHrine (EPIPEN) 0.3 mg/0.3 mL DEVI Inject 0.3 mg into the muscle once.      Marland Kitchen  ezetimibe (ZETIA) 10 MG tablet Take 10 mg by mouth daily.      . fluticasone (FLONASE) 50 MCG/ACT nasal spray Place 2 sprays into the nose daily as needed for rhinitis.      . furosemide (LASIX) 40 MG tablet Take 40 mg by mouth daily.      Marland Kitchen HYDROcodone-acetaminophen (NORCO) 7.5-325 MG per tablet Take 1 tablet by mouth every 6 (six) hours as needed.       Marland Kitchen ibuprofen (ADVIL,MOTRIN) 800 MG tablet       . insulin aspart protamine-insulin aspart (NOVOLOG 70/30) (70-30) 100 UNIT/ML injection Inject 30 Units into the skin 2 (two) times daily with a meal.      . isosorbide mononitrate (IMDUR) 60 MG 24 hr tablet Take 1 tablet (60 mg total) by mouth daily.  30  tablet  11  . lisinopril (PRINIVIL,ZESTRIL) 40 MG tablet Take 20 mg by mouth daily.       . metoprolol (LOPRESSOR) 50 MG tablet Take 50 mg by mouth 2 (two) times daily.      . nitroGLYCERIN (NITROLINGUAL) 0.4 MG/SPRAY spray Place 1 spray under the tongue every 5 (five) minutes as needed for chest pain.  12 g  3  . QVAR 40 MCG/ACT inhaler       . sitaGLIPtan-metformin (JANUMET) 50-500 MG per tablet Take 1 tablet by mouth 2 (two) times daily with a meal.      . solifenacin (VESICARE) 10 MG tablet Take 10 mg by mouth daily as needed (for bladder).       . Ticagrelor (BRILINTA) 90 MG TABS tablet Take 1 tablet (90 mg total) by mouth 2 (two) times daily.  60 tablet  10   No current facility-administered medications for this visit.    Past Medical History  Diagnosis Date  . Stroke   . Tobacco use disorder 07/07/2012  . NSTEMI (non-ST elevated myocardial infarction)   . CAD (coronary artery disease)     2 vessel disease old sub totaled occ. of RCA and subtotal occ. of LAD  . Anginal pain   . Hypertension   . Asthma   . Sleep apnea   . Diabetes mellitus without complication   . GERD (gastroesophageal reflux disease)   . H/O hiatal hernia   . Arthritis   . OSA on CPAP   . Presence of drug coated stent in LAD coronary artery 06/2012    for chronic subtotal LAD occlusion placed 06/2012    Past Surgical History  Procedure Laterality Date  . Cardiac catheterization  07/05/2012    07/05/12  + CAD  . Carpel tunnel    . Coronary angioplasty with stent placement  07/20/2012    Subtotalled ostium of LAD at D1      Dr Herbie Baltimore - Promus DES 2.25 mm x 20 mm - post dilated to 2.65 across D1 & 2.5 in mid stent  . Myocardial perfusion imaging  07/15/2012    EF 57% lge area ant and septal ischemia  . Cardiac catheterization  2010    RCA stenosis with collaterals    RUE:[id number]:XB colds or fevers, no weight changes Skin:no rashes or ulcers HEENT:no blurred vision, no congestion CV:see HPI PUL:see  HPI GI:no diarrhea constipation or melena, no indigestion GU:no hematuria, no dysuria MS:+ Rt knee joint pain, no claudication Neuro:no syncope, no lightheadedness Endo:+ diabetes better controlled, no thyroid disease  PHYSICAL EXAM BP 128/84  Pulse 70  Ht 5\' 7"  (1.702 m)  Wt 289 lb 6.4 oz (  131.271 kg)  BMI 45.32 kg/m2 General:Pleasant affect, NAD Skin:Warm and dry, brisk capillary refill HEENT:normocephalic, sclera clear, mucus membranes moist Neck:supple, no JVD, no bruits  Heart:S1S2 RRR without murmur, gallup, rub or click Lungs:clear without rales, rhonchi, or wheezes Abd: obese, soft, non tender, + BS, do not palpate liver spleen or masses Ext:no lower ext edema, 2+ pedal pulses, 2+ radial pulses Neuro:alert and oriented, MAE, follows commands, + facial symmetry  EKG:SR no acute changes from previous tracing.  ASSESSMENT AND PLAN CAD (coronary artery disease), LAD and 100% occluded RCA  -- PCI to Subtotal LAD CTO 07/04/12 DES stetn. No chest pain, no SOB.  Had been attending Cardiac Rehab until her knee would not allow exercise.  She plans to return once knee pain improved.  Obesity She stated it has been difficult to loose wt. I gave a [employer] card - unsure if insurance will pay.  We did discuss importance of wt. Loss not only for cardiac disease but for arthritic knees.  HTN (hypertension) Well controlled today.  DM (diabetes mellitus) Followed by PCP, but she stated it was improving- HgA1C  Tobacco use disorder Has stopped smoking

## 2013-01-03 NOTE — Assessment & Plan Note (Signed)
She stated it has been difficult to loose wt. I gave a [employer] card - unsure if insurance will pay.  We did discuss importance of wt. Loss not only for cardiac disease but for arthritic knees.

## 2013-01-03 NOTE — Patient Instructions (Signed)
Call if any cardiac issues.  Begin exercising once your knee improved.  Follow up with Dr. Herbie Baltimore in December.

## 2013-01-03 NOTE — Assessment & Plan Note (Signed)
Well controlled today.

## 2013-01-06 NOTE — Telephone Encounter (Signed)
Informed patient that was unable to find any labs since 08/2012 . Verbalized understanding.

## 2013-04-04 ENCOUNTER — Ambulatory Visit (INDEPENDENT_AMBULATORY_CARE_PROVIDER_SITE_OTHER): Payer: Medicare Other | Admitting: Cardiology

## 2013-04-04 VITALS — BP 128/88 | HR 76 | Ht 66.0 in | Wt 291.7 lb

## 2013-04-04 DIAGNOSIS — E785 Hyperlipidemia, unspecified: Secondary | ICD-10-CM

## 2013-04-04 DIAGNOSIS — Z955 Presence of coronary angioplasty implant and graft: Secondary | ICD-10-CM

## 2013-04-04 DIAGNOSIS — F17200 Nicotine dependence, unspecified, uncomplicated: Secondary | ICD-10-CM

## 2013-04-04 DIAGNOSIS — I10 Essential (primary) hypertension: Secondary | ICD-10-CM

## 2013-04-04 DIAGNOSIS — I2510 Atherosclerotic heart disease of native coronary artery without angina pectoris: Secondary | ICD-10-CM

## 2013-04-04 DIAGNOSIS — I214 Non-ST elevation (NSTEMI) myocardial infarction: Secondary | ICD-10-CM

## 2013-04-04 DIAGNOSIS — Z9861 Coronary angioplasty status: Secondary | ICD-10-CM

## 2013-04-04 NOTE — Progress Notes (Signed)
Patient ID: Kimberly Ramos, female   DOB: [birth year], 42 y.o.   MRN: [id number]  Clinic Note: HPI: Kimberly Ramos is a 42 y.o. female with a PMH below who presents today for followup after her LAD PCI..   I last saw Jaeley back in [place]. She was doing very well at that time but no active symptoms. It seemed that the PCI of the LAD had taken care of her exertional dyspnea and chest tightness. She completed cardiac rehabilitation, and had been working with a dietitian down in [place]. Unfortunately her knees really been hurting her and she's been limited by her arthritis pains. She is not able to exercise like she wants to, and subsequently gained back some weight as she had initially lost. She previously was able to walk around the store to shopping or even walk across the parking lot. Now she is able to do whatever she wants to do until her knee bothers her. She is not at all troubled with any dyspnea, she really pushes it faster than usual. She has been looking into joining the [facility] because essentially the cardiac rehabilitation maintenance program was to be too expensive. She did talk about the [facility], and is waiting to see which one that she was going to better deal. She denies any PND, orthopnea or significant edema. No irregular heartbeats or rapid heartbeats. No lightheadedness, dizziness, wooziness or syncope/near-syncope. No TIA or amaurosis fugax symptoms. No melena, hematochezia, nosebleed or hematuria on dual antiplatelet therapy.  She still takes her Lasix as prescribed and with that denies any edema. She denies any heart failure symptoms of PND or orthopnea. We had stopped her Ranexa do to some side effects. Since then she's doing quite well.  She is successfully quit smoking now for almost 2 full months, and is very excited about this. Her only concern is that she is gaining weight, as opposed to llosing weight.  Allergies  Allergen Reactions  . Ciprofloxacin Hives and Swelling     Current Outpatient Prescriptions  Medication Sig Dispense Refill  . albuterol (PROVENTIL HFA;VENTOLIN HFA) 108 (90 BASE) MCG/ACT inhaler Inhale 2 puffs into the lungs every 6 (six) hours as needed for wheezing.      Marland Kitchen aspirin EC 81 MG tablet Take 81 mg by mouth daily.      Marland Kitchen atorvastatin (LIPITOR) 80 MG tablet Take 80 mg by mouth daily.      . BD INSULIN SYRINGE ULTRAFINE 31G X 5/16" 0.3 ML MISC       . Cetirizine HCl (ZYRTEC ALLERGY PO) Take by mouth.      . cyclobenzaprine (FLEXERIL) 10 MG tablet Take 10 mg by mouth 3 (three) times daily as needed for muscle spasms.      Marland Kitchen EPINEPHrine (EPIPEN) 0.3 mg/0.3 mL DEVI Inject 0.3 mg into the muscle once.      . ezetimibe (ZETIA) 10 MG tablet Take 10 mg by mouth daily.      . fluticasone (FLONASE) 50 MCG/ACT nasal spray Place 2 sprays into the nose daily as needed for rhinitis.      . furosemide (LASIX) 40 MG tablet Take 40 mg by mouth daily.      Marland Kitchen HYDROcodone-acetaminophen (NORCO) 7.5-325 MG per tablet Take 1 tablet by mouth every 6 (six) hours as needed.       Marland Kitchen ibuprofen (ADVIL,MOTRIN) 800 MG tablet       . insulin aspart protamine-insulin aspart (NOVOLOG 70/30) (70-30) 100 UNIT/ML injection Inject 30 Units  into the skin 2 (two) times daily with a meal.      . isosorbide mononitrate (IMDUR) 60 MG 24 hr tablet Take 1 tablet (60 mg total) by mouth daily.  30 tablet  11  . lisinopril (PRINIVIL,ZESTRIL) 40 MG tablet Take 40 mg by mouth daily.       . metoprolol (LOPRESSOR) 50 MG tablet Take 50 mg by mouth 2 (two) times daily.      . nitroGLYCERIN (NITROLINGUAL) 0.4 MG/SPRAY spray Place 1 spray under the tongue every 5 (five) minutes as needed for chest pain.  12 g  3  . QVAR 40 MCG/ACT inhaler       . sitaGLIPtan-metformin (JANUMET) 50-500 MG per tablet Take 1 tablet by mouth 2 (two) times daily with a meal.      . solifenacin (VESICARE) 10 MG tablet Take 10 mg by mouth daily as needed (for bladder).       . Ticagrelor (BRILINTA) 90 MG TABS  tablet Take 1 tablet (90 mg total) by mouth 2 (two) times daily.  60 tablet  10   No current facility-administered medications for this visit.    Past Medical History  Diagnosis Date  . Stroke   . Tobacco use disorder 07/07/2012  . NSTEMI (non-ST elevated myocardial infarction)   . CAD (coronary artery disease)     2 vessel disease old sub totaled occ. of RCA and subtotal occ. of LAD  . Anginal pain   . Hypertension   . Asthma   . Sleep apnea   . Diabetes mellitus without complication   . GERD (gastroesophageal reflux disease)   . H/O hiatal hernia   . Arthritis   . OSA on CPAP   . Presence of drug coated stent in LAD coronary artery 06/2012    for chronic subtotal LAD occlusion placed 06/2012    Past Surgical History  Procedure Laterality Date  . Cardiac catheterization  07/05/2012    07/05/12  + CAD  . Carpel tunnel    . Coronary angioplasty with stent placement  07/20/2012    Subtotalled ostium of LAD at D1      Dr Herbie Baltimore - Promus DES 2.25 mm x 20 mm - post dilated to 2.65 across D1 & 2.5 in mid stent  . Myocardial perfusion imaging  07/15/2012    EF 57% lge area ant and septal ischemia  . Cardiac catheterization  2010    RCA stenosis with collaterals    History   Social History  . Marital Status: Married    Spouse Name: N/A    Number of Children: N/A  . Years of Education: N/A   Occupational History  . Not on file.   Social History Main Topics  . Smoking status: Former Smoker    Quit date: 07/04/2012  . Smokeless tobacco: Never Used  . Alcohol Use: Yes     Comment: occasional  . Drug Use: No  . Sexual Activity: Not on file   Other Topics Concern  . Not on file   Social History Narrative  . No narrative on file    ROS: Comprehensive Review of Systems - Negative with exception pertinent positives noted above.  bilateral knee pain right greater than left. Also noted the increased weight. She does say she has tried to adjust her diet eating more healthy  foods and decreasing portion sizes but isn't bothered by her weight going up and down.  PHYSICAL EXAM BP 128/88  Pulse 76  Ht 5\' 6"  (  1.676 m)  Wt 291 lb 11.2 oz (132.314 kg)  BMI 47.10 kg/m2 General appearance: alert, cooperative, appears stated age and morbidly obese Neck: no adenopathy, no carotid bruit, no JVD, supple, symmetrical, trachea midline and thyroid not enlarged, symmetric, no tenderness/mass/nodules Lungs: CTAB, normal percussion bilaterally and Nonlabored Heart: regular rate and rhythm, S1, S2 normal, no murmur, click, rub or gallop Abdomen: abnormal findings:  obese and Unable to palpate any hepatomegaly due to body habitus.  Soft/NT/NP/NA BS Extremities: extremities normal, atraumatic, no cyanosis or edema Pulses: 2+ and symmetric Neurologic: Mental status: Alert, oriented, thought content appropriate, Normal mood and affect  JYN:[id number] today: No  ASSESSMENT: Notable improvement in overall functional status. Her main issue has been with her knee arthritis pain.  This has made it difficult for her to keep her weight down.  Patient Active Problem List   Diagnosis Date Noted  . Severe obesity (BMI >= 40) 04/06/2013    Priority: High  . Postural dizziness 09/06/2012    Priority: High  . CAD (coronary artery disease), LAD and 100% occluded RCA  -- PCI to Subtotal LAD CTO 07/04/12 DES stetn. 07/04/2012    Priority: High  . Tobacco use disorder 07/07/2012    Priority: Medium  . DM (diabetes mellitus) 07/04/2012    Priority: Medium  . HTN (hypertension) 07/04/2012    Priority: Medium  . Presence of drug coated stent in LAD coronary artery - From Prox LAD, across D1 into previously Chronically "subtotal" occluded mid LAD 07/21/2012    Priority: Low  . NSTEMI (non-ST elevated myocardial infarction), 07/07/12 07/04/2012    Priority: Low  . Hyperlipidemia LDL goal <70 04/06/2013  . Callus of foot 09/21/2012  . Tinea pedis 09/21/2012    PLAN: Orders Placed This  Encounter  Procedures  . EKG 12-Lead  . EKG 12-Lead    This order was created through External Result Entry   Followup: 6 months with Nada Boozer, NP; With me 6 months after  Marykay Lex, M.D., M.S. [facility] [address] [place], [place]  [address]  [phone] Pager # [phone] 04/06/2013 3:38 PM

## 2013-04-04 NOTE — Patient Instructions (Signed)
Your physician wants you to follow-up in 6 months with Kimberly Ramos and 12 months with Kimberly Ramos.   You will receive a reminder letter in the mail two months in advance. If you don't receive a letter, please call our office to schedule the follow-up appointment.  You have been referred to  A nutritionist in [place]. Please call back with information.  Continue with current medication

## 2013-04-06 ENCOUNTER — Encounter: Payer: Self-pay | Admitting: Cardiology

## 2013-04-06 DIAGNOSIS — E785 Hyperlipidemia, unspecified: Secondary | ICD-10-CM | POA: Insufficient documentation

## 2013-04-06 NOTE — Assessment & Plan Note (Signed)
I congratulated her on her she sustained abstinence. She is very happy about that. She said that she has a a lot of money, and no longer has that morning cough

## 2013-04-06 NOTE — Assessment & Plan Note (Signed)
I talked her about options for referral to a nutritionist. We'll refer her back to the dietitian in [facility]. I also hope that her plans to join the [facility] or to be able come through so she could do some water aerobics. Her goal is to lose about 10% of body weight the first year.

## 2013-04-06 NOTE — Assessment & Plan Note (Signed)
Excellent control based on current history she is on statin plus Zetia.

## 2013-04-06 NOTE — Assessment & Plan Note (Signed)
She remains on dual antiplatelet therapy. No signs of bleeding. As it is in the LAD, I was concerned about a the a size mismatch between the ostium LAD in the downstream LAD. Explained this to her. As the LAD flow improves there is a possibility that the stent will show a size mismatch. I want to close this and keep her on the dual antiplatelet regimen beyond one year. We could switch her to aspirin Plavix.

## 2013-04-06 NOTE — Assessment & Plan Note (Signed)
Very stable from a symptom standpoint. No significant anginal symptoms. She is on a good regimen of aspirin plus Brilinta, atorvastatin/Zetia with very well controlled lipids, as well as the combination lisinopril and metoprolol. She also has Imdur and Lasix for her edema. She denies any heart failure symptoms or any anginal symptoms. Dramatic improvement since her precath/non-STEMI. She is doing well with her smoking cessation.

## 2013-04-06 NOTE — Assessment & Plan Note (Signed)
This was actually a blessing in disguise since it brought to light her LAD CTO that we opened.  She now has resolution of her long-standing exertional dyspnea & angina.

## 2013-04-06 NOTE — Assessment & Plan Note (Signed)
Well controlled today on current regimen. Her diastolic pressures all of elevated, but usually is lower. On good regimen as noted above.

## 2013-04-25 ENCOUNTER — Other Ambulatory Visit: Payer: Self-pay | Admitting: Cardiology

## 2013-04-25 NOTE — Telephone Encounter (Signed)
Rx was sent to pharmacy electronically. 

## 2013-07-02 ENCOUNTER — Other Ambulatory Visit (HOSPITAL_COMMUNITY): Payer: Self-pay | Admitting: Cardiology

## 2013-07-03 NOTE — Telephone Encounter (Signed)
Rx was sent to pharmacy electronically. 

## 2013-09-11 ENCOUNTER — Telehealth: Payer: Self-pay | Admitting: Cardiology

## 2013-09-11 ENCOUNTER — Other Ambulatory Visit: Payer: Self-pay

## 2013-09-11 MED ORDER — TICAGRELOR 90 MG PO TABS: 90.0000 mg | ORAL_TABLET | Freq: Two times a day (BID) | ORAL | Status: DC

## 2013-09-11 NOTE — Telephone Encounter (Signed)
Refills already sent into pharmacy electronically. Please see refill encounter from 09/11/2013.

## 2013-09-11 NOTE — Telephone Encounter (Signed)
Been to the pharmacy to pick up her Brilinta and it was not there. Please call this in today at [phone].

## 2013-09-11 NOTE — Telephone Encounter (Signed)
Rx was sent to pharmacy electronically. 

## 2013-11-06 ENCOUNTER — Telehealth: Payer: Self-pay | Admitting: Cardiology

## 2013-11-06 MED ORDER — METOPROLOL TARTRATE 50 MG PO TABS: 50.0000 mg | ORAL_TABLET | Freq: Two times a day (BID) | ORAL | Status: DC

## 2013-11-06 NOTE — Telephone Encounter (Signed)
Pt need a new prescription for her metoprolol please. Please call to [phone].

## 2013-11-06 NOTE — Telephone Encounter (Signed)
Rx was sent to pharmacy electronically. 

## 2013-11-17 ENCOUNTER — Ambulatory Visit (INDEPENDENT_AMBULATORY_CARE_PROVIDER_SITE_OTHER): Payer: Medicare Other | Admitting: Cardiology

## 2013-11-17 ENCOUNTER — Ambulatory Visit: Payer: Medicare Other | Admitting: Cardiology

## 2013-11-17 ENCOUNTER — Encounter: Payer: Self-pay | Admitting: Cardiology

## 2013-11-17 VITALS — BP 138/87 | HR 69 | Ht 61.0 in | Wt 292.0 lb

## 2013-11-17 DIAGNOSIS — R0989 Other specified symptoms and signs involving the circulatory and respiratory systems: Secondary | ICD-10-CM

## 2013-11-17 DIAGNOSIS — Z9861 Coronary angioplasty status: Secondary | ICD-10-CM

## 2013-11-17 DIAGNOSIS — R0609 Other forms of dyspnea: Secondary | ICD-10-CM

## 2013-11-17 DIAGNOSIS — Z955 Presence of coronary angioplasty implant and graft: Secondary | ICD-10-CM

## 2013-11-17 DIAGNOSIS — I10 Essential (primary) hypertension: Secondary | ICD-10-CM

## 2013-11-17 DIAGNOSIS — E785 Hyperlipidemia, unspecified: Secondary | ICD-10-CM

## 2013-11-17 DIAGNOSIS — I2510 Atherosclerotic heart disease of native coronary artery without angina pectoris: Secondary | ICD-10-CM

## 2013-11-17 NOTE — Progress Notes (Signed)
11/17/2013   PCP: Jackie Plum, MD   Chief Complaint  Patient presents with  . ROV 6 months    C/o pulled muscle in chest, shortness of breath on exertion, ankle edema.    Primary Cardiologist:Dr. Ranae Palms   HPI:  43 year old female with PMH as below presents for follow up.  She has hx of CAD with stent placed in 2014.  She has done well since that time.  She did have some knee arthritis but this last aspiration and injection has helped and she has started exercising.    She does have DOE and occ chest discomfort. But she also overused her Rt shoulder and some is soreness.  Her PCP follows her chol. And this will be drawn in Sept.  I have asked for a copy.  She is taking her Brilinta without complications, though she does have bruising.    She is watching her diet but knows she must work on this.  Allergies  Allergen Reactions  . Ciprofloxacin Hives and Swelling    Current Outpatient Prescriptions  Medication Sig Dispense Refill  . albuterol (PROVENTIL HFA;VENTOLIN HFA) 108 (90 BASE) MCG/ACT inhaler Inhale 2 puffs into the lungs every 6 (six) hours as needed for wheezing.      Marland Kitchen aspirin EC 81 MG tablet Take 81 mg by mouth daily.      Marland Kitchen atorvastatin (LIPITOR) 80 MG tablet Take 80 mg by mouth daily.      . BD INSULIN SYRINGE ULTRAFINE 31G X 5/16" 0.3 ML MISC       . Cetirizine HCl (ZYRTEC ALLERGY PO) Take by mouth.      . cyclobenzaprine (FLEXERIL) 10 MG tablet Take 10 mg by mouth 3 (three) times daily as needed for muscle spasms.      Marland Kitchen EPINEPHrine (EPIPEN) 0.3 mg/0.3 mL DEVI Inject 0.3 mg into the muscle once.      . fluticasone (FLONASE) 50 MCG/ACT nasal spray Place 2 sprays into the nose daily as needed for rhinitis.      . furosemide (LASIX) 40 MG tablet Take 40 mg by mouth daily.      Marland Kitchen HYDROcodone-acetaminophen (NORCO) 7.5-325 MG per tablet Take 1 tablet by mouth every 6 (six) hours as needed.       Marland Kitchen ibuprofen (ADVIL,MOTRIN) 800 MG tablet       .  insulin aspart protamine-insulin aspart (NOVOLOG 70/30) (70-30) 100 UNIT/ML injection Inject 30 Units into the skin 2 (two) times daily with a meal.      . isosorbide mononitrate (IMDUR) 60 MG 24 hr tablet TAKE 1 TABLET (60 MG TOTAL) BY MOUTH DAILY.  30 tablet  11  . lisinopril (PRINIVIL,ZESTRIL) 40 MG tablet Take 40 mg by mouth daily.       . metoprolol (LOPRESSOR) 50 MG tablet Take 1 tablet (50 mg total) by mouth 2 (two) times daily.  60 tablet  4  . metroNIDAZOLE (METROGEL) 0.75 % vaginal gel Place 1 application vaginally as needed.      Marland Kitchen NEXIUM 40 MG capsule Take 1 capsule by mouth daily.      . nitroGLYCERIN (NITROLINGUAL) 0.4 MG/SPRAY spray Place 1 spray under the tongue every 5 (five) minutes as needed for chest pain.  12 g  3  . QVAR 40 MCG/ACT inhaler       . sitaGLIPtan-metformin (JANUMET) 50-500 MG per tablet Take 1 tablet by mouth 2 (two) times daily with a meal.      .  solifenacin (VESICARE) 10 MG tablet Take 10 mg by mouth daily as needed (for bladder).       . ticagrelor (BRILINTA) 90 MG TABS tablet Take 1 tablet (90 mg total) by mouth 2 (two) times daily.  180 tablet  2  . ZETIA 10 MG tablet TAKE 1 TABLET BY MOUTH EVERY DAY  30 tablet  11   No current facility-administered medications for this visit.    Past Medical History  Diagnosis Date  . Stroke   . Tobacco use disorder 07/07/2012  . NSTEMI (non-ST elevated myocardial infarction)   . CAD (coronary artery disease)     2 vessel disease old sub totaled occ. of RCA and subtotal occ. of LAD  . Anginal pain   . Hypertension   . Asthma   . Sleep apnea   . Diabetes mellitus without complication   . GERD (gastroesophageal reflux disease)   . H/O hiatal hernia   . Arthritis   . OSA on CPAP   . Presence of drug coated stent in LAD coronary artery 06/2012    for chronic subtotal LAD occlusion placed 06/2012    Past Surgical History  Procedure Laterality Date  . Cardiac catheterization  07/05/2012    07/05/12  + CAD  .  Carpel tunnel    . Coronary angioplasty with stent placement  07/20/2012    Subtotalled ostium of LAD at D1      Dr Herbie Baltimore - Promus DES 2.25 mm x 20 mm - post dilated to 2.65 across D1 & 2.5 in mid stent  . Myocardial perfusion imaging  07/15/2012    EF 57% lge area ant and septal ischemia  . Cardiac catheterization  2010    RCA stenosis with collaterals    IBB:[id number] colds or fevers, some allergies coming back,  weight creeping up Skin:no rashes or ulcers HEENT:no blurred vision, no congestion CV:see HPI PUL:see HPI-- she does use her sleep apnea GI:no diarrhea constipation or melena, no indigestion GU:no hematuria, no dysuria MS:+ joint pain knees are currently stable., no claudication Neuro:no syncope, no lightheadedness Endo:+ diabetes, no thyroid disease  Wt Readings from Last 3 Encounters:  11/17/13 292 lb (132.45 kg)  04/04/13 291 lb 11.2 oz (132.314 kg)  01/03/13 289 lb 6.4 oz (131.271 kg)    PHYSICAL EXAM BP 138/87  Pulse 69  Ht 5\' 1"  (1.549 m)  Wt 292 lb (132.45 kg)  BMI 55.20 kg/m2 General:Pleasant affect, NAD Skin:Warm and dry, brisk capillary refill HEENT:normocephalic, sclera clear, mucus membranes moist Neck:supple, no JVD, no bruits  Heart:S1S2 RRR without murmur, gallup, rub or click Lungs:clear without rales, rhonchi, or wheezes IHW:TUUE, non tender, + BS, do not palpate liver spleen or masses Ext:no lower ext edema, 2+ pedal pulses, 2+ radial pulses Neuro:alert and oriented, MAE, follows commands, + facial symmetry EKG:SR old septal infarct no acute changes.  ASSESSMENT AND PLAN CAD (coronary artery disease), LAD and 100% occluded RCA  -- PCI to Subtotal LAD CTO 07/04/12 DES stetn. Currently stable, mild chest discomfort and DOE but allergies worse now as well.  May be due to deconditioning.  IF symptoms increase instead of improve with exercise she will call us.  EKG is stable.  HTN (hypertension) controlled  Hyperlipidemia LDL goal  <70 Followed by PCP and will be checked in Sept.  Presence of drug coated stent in LAD coronary artery - From Prox LAD, across D1 into previously Chronically "subtotal" occluded mid LAD stable  Severe obesity (BMI >= 40) Discussed diet  and she was given card for nutritionist.

## 2013-11-17 NOTE — Assessment & Plan Note (Signed)
Discussed diet and she was given card for nutritionist.

## 2013-11-17 NOTE — Assessment & Plan Note (Signed)
Currently stable, mild chest discomfort and DOE but allergies worse now as well.  May be due to deconditioning.  IF symptoms increase instead of improve with exercise she will call us.  EKG is stable.

## 2013-11-17 NOTE — Patient Instructions (Signed)
Kimberly Boozer, NP has made no changes in your current medications. Your physician recommends that you schedule a follow-up appointment in 6 months with Dr Herbie Baltimore.  Please have your primary care doctor send [place] a copy of your cholesterol levels when you have this done.

## 2013-11-17 NOTE — Assessment & Plan Note (Signed)
Followed by PCP and will be checked in Sept.

## 2013-11-17 NOTE — Assessment & Plan Note (Signed)
stable °

## 2013-11-17 NOTE — Assessment & Plan Note (Signed)
controlled 

## 2013-11-20 ENCOUNTER — Telehealth: Payer: Self-pay | Admitting: Cardiology

## 2013-11-20 NOTE — Telephone Encounter (Signed)
Deferred to [facility]

## 2013-11-20 NOTE — Telephone Encounter (Signed)
Resource should be in grocery store, near [employer], but for diabetics.

## 2013-11-20 NOTE — Telephone Encounter (Signed)
Patient states that she was in on Friday of last week and saw Nada Boozer, NP.  Vernona Rieger told her about a protein drink that she could have but the name was not on her discharge paper.

## 2013-11-21 NOTE — Telephone Encounter (Signed)
Spoke to patient -mailed her coupons for [employer]

## 2013-11-21 NOTE — Telephone Encounter (Signed)
I have talked to this pt and passed along the instructions from Nada Boozer, pt stated understanding of instructions

## 2014-04-05 ENCOUNTER — Encounter (HOSPITAL_COMMUNITY): Payer: Self-pay | Admitting: Cardiovascular Disease

## 2014-05-01 ENCOUNTER — Ambulatory Visit: Payer: Medicare Other | Admitting: Cardiology

## 2014-05-10 ENCOUNTER — Ambulatory Visit: Payer: Medicare Other | Admitting: Cardiology

## 2014-05-11 ENCOUNTER — Ambulatory Visit (INDEPENDENT_AMBULATORY_CARE_PROVIDER_SITE_OTHER): Payer: Medicare Other | Admitting: Cardiology

## 2014-05-11 ENCOUNTER — Other Ambulatory Visit: Payer: Self-pay | Admitting: Cardiology

## 2014-05-11 VITALS — BP 132/84 | HR 66 | Ht 66.0 in | Wt 292.0 lb

## 2014-05-11 DIAGNOSIS — E785 Hyperlipidemia, unspecified: Secondary | ICD-10-CM

## 2014-05-11 DIAGNOSIS — I214 Non-ST elevation (NSTEMI) myocardial infarction: Secondary | ICD-10-CM

## 2014-05-11 DIAGNOSIS — I10 Essential (primary) hypertension: Secondary | ICD-10-CM

## 2014-05-11 DIAGNOSIS — I2510 Atherosclerotic heart disease of native coronary artery without angina pectoris: Secondary | ICD-10-CM

## 2014-05-11 DIAGNOSIS — Z955 Presence of coronary angioplasty implant and graft: Secondary | ICD-10-CM

## 2014-05-11 NOTE — Patient Instructions (Addendum)
Your physician recommends that you schedule a follow-up appointment in: 3 months with Dr. Herbie Baltimore. No changes were made today in your therapy.

## 2014-05-13 ENCOUNTER — Encounter: Payer: Self-pay | Admitting: Cardiology

## 2014-05-13 NOTE — Progress Notes (Signed)
PCP: Jackie Plum, MD  Clinic Note: Chief Complaint  Patient presents with  . Annual Exam    patient having SOB. also some heart  fluttering"  . Coronary Artery Disease    PCI to LAD CTO in March 2014   HPI: Kimberly Ramos is a 44 y.o. female with a PMH below who presents today for six-month followup. She saw Delfin Gant, NP in July 2015.  She has a long-standing history of exertional dyspnea, then finally in March of 2014 she had a relook cardiac catheterization which revealed that indeed her LAD had been occluded since her initial cardiac catheterization years before. This was treated with a drug-eluting stent for subtotal proximal occlusion.  Since then she has not had anywhere near the extent of exertional dyspnea. She remains morbidly obese and deconditioned however and therefore does have exertional dyspnea at baseline.  Past Medical History  Diagnosis Date  . Stroke   . Tobacco use disorder 07/07/2012  . NSTEMI (non-ST elevated myocardial infarction)   . CAD (coronary artery disease)     2 vessel disease old sub totaled occ. of RCA and subtotal occ. of LAD  . Anginal pain   . Hypertension   . Asthma   . Sleep apnea   . Diabetes mellitus without complication   . GERD (gastroesophageal reflux disease)   . H/O hiatal hernia   . Arthritis   . OSA on CPAP   . Presence of drug coated stent in LAD coronary artery 06/2012    for chronic subtotal LAD occlusion placed 06/2012    Prior Cardiac Evaluation and Past Surgical History: Past Surgical History  Procedure Laterality Date  . Cardiac catheterization  07/05/2012    07/05/12  + CAD  . Carpel tunnel    . Coronary angioplasty with stent placement  07/20/2012    Subtotalled ostium of LAD at D1      Dr Herbie Baltimore - Promus DES 2.25 mm x 20 mm - post dilated to 2.65 across D1 & 2.5 in mid stent  . Myocardial perfusion imaging  07/15/2012    EF 57% lge area ant and septal ischemia  . Cardiac catheterization  2010    RCA stenosis  with collaterals  . Left heart catheterization with coronary angiogram N/A 07/05/2012    Procedure: LEFT HEART CATHETERIZATION WITH CORONARY ANGIOGRAM;  Surgeon: Lennette Bihari, MD;  Location: [facility] CATH LAB;  Service: Cardiovascular;  Laterality: N/A;  . Percutaneous coronary stent intervention (pci-s) N/A 07/20/2012    Procedure: PERCUTANEOUS CORONARY STENT INTERVENTION (PCI-S);  Surgeon: Marykay Lex, MD;  Location: [facility] CATH LAB;  Service: Cardiovascular;  Laterality: N/A;   Interval History: She presents today pretty much doing relatively well. For the past few weeks she's been dealing with sinus congestion/bursitis.  He has just completed a course of antibiotics, but still feels very congested.  Is not sure it is related to the congestion or walk but she has noted that she developed a more short of breath with exertion than she usually is.  She never really had anginal type chest pain, with the exception of during her non-STEMI. She has not had exertional or resting chest pain now. No heart failure symptoms such as PND, orthopnea or edema.  She is also noted a few episodes of fluttering in her chest that last a few seconds at a time but nothing to suggest a sustained rhythm issue. No lightheadedness no dizziness, syncope/near syncope or TIA/amaurosis fugax symptoms.  ROS: A comprehensive was  performed. Review of Systems  Constitutional: Negative for fever, malaise/fatigue and diaphoresis.  HENT: Positive for congestion and sore throat.   Eyes: Negative for blurred vision.  Respiratory: Negative for cough.   Cardiovascular: Positive for palpitations and leg swelling (Trace).       Noted in history of present illness  Gastrointestinal: Negative for blood in stool and melena.  Genitourinary: Negative for hematuria.  Musculoskeletal: Positive for joint pain. Negative for myalgias.  Neurological: Negative for dizziness.  Endo/Heme/Allergies: Does not bruise/bleed easily.  All other systems  reviewed and are negative.   Current Outpatient Prescriptions on File Prior to Visit  Medication Sig Dispense Refill  . albuterol (PROVENTIL HFA;VENTOLIN HFA) 108 (90 BASE) MCG/ACT inhaler Inhale 2 puffs into the lungs every 6 (six) hours as needed for wheezing.    Marland Kitchen aspirin EC 81 MG tablet Take 81 mg by mouth daily.    Marland Kitchen atorvastatin (LIPITOR) 80 MG tablet Take 80 mg by mouth daily.    . BD INSULIN SYRINGE ULTRAFINE 31G X 5/16" 0.3 ML MISC     . Cetirizine HCl (ZYRTEC ALLERGY PO) Take by mouth.    . cyclobenzaprine (FLEXERIL) 10 MG tablet Take 10 mg by mouth 3 (three) times daily as needed for muscle spasms.    Marland Kitchen EPINEPHrine (EPIPEN) 0.3 mg/0.3 mL DEVI Inject 0.3 mg into the muscle once.    . fluticasone (FLONASE) 50 MCG/ACT nasal spray Place 2 sprays into the nose daily as needed for rhinitis.    . furosemide (LASIX) 40 MG tablet Take 40 mg by mouth daily.    Marland Kitchen HYDROcodone-acetaminophen (NORCO) 7.5-325 MG per tablet Take 1 tablet by mouth every 6 (six) hours as needed.     Marland Kitchen ibuprofen (ADVIL,MOTRIN) 800 MG tablet     . insulin aspart protamine-insulin aspart (NOVOLOG 70/30) (70-30) 100 UNIT/ML injection Inject 30 Units into the skin 2 (two) times daily with a meal.    . lisinopril (PRINIVIL,ZESTRIL) 40 MG tablet Take 40 mg by mouth daily.     . metroNIDAZOLE (METROGEL) 0.75 % vaginal gel Place 1 application vaginally as needed.    Marland Kitchen NEXIUM 40 MG capsule Take 1 capsule by mouth daily.    . nitroGLYCERIN (NITROLINGUAL) 0.4 MG/SPRAY spray Place 1 spray under the tongue every 5 (five) minutes as needed for chest pain. 12 g 3  . QVAR 40 MCG/ACT inhaler     . sitaGLIPtan-metformin (JANUMET) 50-500 MG per tablet Take 1 tablet by mouth 2 (two) times daily with a meal.    . solifenacin (VESICARE) 10 MG tablet Take 10 mg by mouth daily as needed (for bladder).     . ticagrelor (BRILINTA) 90 MG TABS tablet Take 1 tablet (90 mg total) by mouth 2 (two) times daily. 180 tablet 2  . ZETIA 10 MG tablet  TAKE 1 TABLET BY MOUTH EVERY DAY 30 tablet 11   No current facility-administered medications on file prior to visit.   Allergies  Allergen Reactions  . Ciprofloxacin Hives and Swelling   SOCIAL AND FAMILY HISTORY REVIEWED IN EPIC -- No change  Wt Readings from Last 3 Encounters:  05/11/14 292 lb (132.45 kg)  11/17/13 292 lb (132.45 kg)  04/04/13 291 lb 11.2 oz (132.314 kg)   PHYSICAL EXAM BP 132/84 mmHg  Pulse 66  Ht  (1.676 m)  Wt 292 lb (132.45 kg)  BMI 47.15 kg/m2 General appearance: alert, cooperative, appears stated age, no distress and morbidly obese Neck: no adenopathy, no carotid bruit and  no JVD Lungs: clear to auscultation bilaterally, normal percussion bilaterally and non-labored Heart: regular rate and rhythm, S1, S2 normal, no murmur, click, rub or gallop; unable to palpate PMI Abdomen: soft, non-tender; bowel sounds normal; no masses,  no organomegaly; No HSM Extremities: extremities normal, atraumatic, no cyanosis, and edema - trivial  Pulses: 2+ and symmetric;  Skin: mobility and turgor normal Neurologic: Mental status: Alert, oriented, thought content appropriate Cranial nerves: normal (II-XII grossly intact)    Adult ECG Report  Rate: 66 ;  Rhythm: normal sinus rhythm and LAA; normal intervals, durations & voltage  Narrative Interpretation: Essentially normal EKG.  Recent Labs:  Check PCP. She was told that her labs were not quite as good the last time. They are due to be rechecked soon.   ASSESSMENT / PLAN: CAD (coronary artery disease), LAD and 100% occluded RCA  -- PCI to Subtotal LAD CTO 07/04/12 DES stetn. Stable score is any anginal symptoms go. She does have exertional dyspnea which I think may be more contributed to her deconditioning and exacerbated by her sinus congestion and allergies.  I would like to see her back in 3 months to see if she's recovered. She is on statin at high dose monitored by PCP, ACE inhibitor beta blocker as well as  Zetia. On Brilinta without any bleeding issues. No longer on aspirin.  She could potentially be switched to Plavix, but states that her current payment options for Eliquis are quite acceptable.  As long as she goes on Eliquis a patient spine to be off of aspirin.  At this point it could be stopped for procedures if needed.   NSTEMI (non-ST elevated myocardial infarction), 07/07/12 Probably was the main ischemia related infarction but it was what finally brought to light her occluded LAD that had been missed. No further anginal symptoms but does have her baseline exertional dyspnea as described.   Essential hypertension Well-controlled on current regimen.   Hyperlipidemia LDL goal <70 On high dose statin. Monitored by PCP. Last check in 2014 that I have available showed excellent result   Presence of drug coated stent in LAD coronary artery - From Prox LAD, across D1 into previously Chronically "subtotal" occluded mid LAD See discussion above reference Brilinta versus Plavix for antiplatelet coverage. Importantly she is now almost 2 years out from PCI and should be okay holding antiplatelet agents or procedures needed.   Severe obesity (BMI >= 40) The patient understands the need to lose weight with diet and exercise. We have discussed specific strategies for this. Unfortunately she is in a catch 22 with her weight causing her to have arthritis pains limit her inability to walk in order to lose weight. I talked about doing water aerobics or water walking, & we also discussed that dietary adjustments need to be made.    Orders Placed This Encounter  Procedures  . EKG 12-Lead   Meds ordered this encounter  Medications  . meloxicam (MOBIC) 15 MG tablet    Sig: Take 15 mg by mouth daily with breakfast.    Followup: 3 month   HARDING, Piedad Climes, M.D., M.S. Interventional Cardiologist   Pager # [phone]

## 2014-05-13 NOTE — Assessment & Plan Note (Signed)
On high dose statin. Monitored by PCP. Last check in 2014 that I have available showed excellent result

## 2014-05-13 NOTE — Assessment & Plan Note (Signed)
See discussion above reference Brilinta versus Plavix for antiplatelet coverage. Importantly she is now almost 2 years out from PCI and should be okay holding antiplatelet agents or procedures needed.

## 2014-05-13 NOTE — Assessment & Plan Note (Signed)
Probably was the main ischemia related infarction but it was what finally brought to light her occluded LAD that had been missed. No further anginal symptoms but does have her baseline exertional dyspnea as described.

## 2014-05-13 NOTE — Assessment & Plan Note (Signed)
Stable score is any anginal symptoms go. She does have exertional dyspnea which I think may be more contributed to her deconditioning and exacerbated by her sinus congestion and allergies.  I would like to see her back in 3 months to see if she's recovered. She is on statin at high dose monitored by PCP, ACE inhibitor beta blocker as well as Zetia. On Brilinta without any bleeding issues. No longer on aspirin.  She could potentially be switched to Plavix, but states that her current payment options for Eliquis are quite acceptable.  As long as she goes on Eliquis a patient spine to be off of aspirin.  At this point it could be stopped for procedures if needed.

## 2014-05-13 NOTE — Assessment & Plan Note (Signed)
Well-controlled on current regimen. ?

## 2014-05-13 NOTE — Assessment & Plan Note (Signed)
The patient understands the need to lose weight with diet and exercise. We have discussed specific strategies for this. Unfortunately she is in a catch 22 with her weight causing her to have arthritis pains limit her inability to walk in order to lose weight. I talked about doing water aerobics or water walking, & we also discussed that dietary adjustments need to be made.

## 2014-06-18 ENCOUNTER — Other Ambulatory Visit: Payer: Self-pay | Admitting: Cardiology

## 2014-06-18 NOTE — Telephone Encounter (Signed)
Rx(s) sent to pharmacy electronically.  

## 2014-06-27 ENCOUNTER — Telehealth: Payer: Self-pay | Admitting: Cardiology

## 2014-06-28 NOTE — Telephone Encounter (Signed)
Close encounter 

## 2014-07-06 ENCOUNTER — Other Ambulatory Visit: Payer: Self-pay | Admitting: Cardiology

## 2014-07-12 ENCOUNTER — Ambulatory Visit: Payer: Medicare Other | Admitting: Cardiology

## 2014-07-24 ENCOUNTER — Other Ambulatory Visit: Payer: Self-pay | Admitting: Cardiology

## 2014-07-24 ENCOUNTER — Telehealth: Payer: Self-pay | Admitting: Cardiology

## 2014-07-24 MED ORDER — ISOSORBIDE MONONITRATE ER 60 MG PO TB24: 60.0000 mg | ORAL_TABLET | Freq: Every day | ORAL | Status: DC

## 2014-07-24 NOTE — Telephone Encounter (Signed)
°  1. Which medications need to be refilled? Isosorbide Mn er 60mg    2. Which pharmacy is medication to be sent to?[facility] on [address] In [place]   3. Do they need a 30 day or 90 day supply? 90  4. Would they like a call back once the medication has been sent to the pharmacy? Yes

## 2014-07-24 NOTE — Telephone Encounter (Signed)
Spoke to patient she states she has been on medication since M.I. RN informed patient medication has been e- sent to pharmacy

## 2014-08-10 ENCOUNTER — Ambulatory Visit: Payer: Medicare Other | Admitting: Cardiology

## 2014-08-13 ENCOUNTER — Encounter: Payer: Self-pay | Admitting: Cardiology

## 2014-08-13 ENCOUNTER — Ambulatory Visit (INDEPENDENT_AMBULATORY_CARE_PROVIDER_SITE_OTHER): Payer: Medicare Other | Admitting: Cardiology

## 2014-08-13 VITALS — BP 118/78 | Ht 65.0 in | Wt 283.5 lb

## 2014-08-13 DIAGNOSIS — I2510 Atherosclerotic heart disease of native coronary artery without angina pectoris: Secondary | ICD-10-CM | POA: Diagnosis not present

## 2014-08-13 DIAGNOSIS — E785 Hyperlipidemia, unspecified: Secondary | ICD-10-CM

## 2014-08-13 DIAGNOSIS — I10 Essential (primary) hypertension: Secondary | ICD-10-CM

## 2014-08-13 DIAGNOSIS — Z955 Presence of coronary angioplasty implant and graft: Secondary | ICD-10-CM

## 2014-08-13 DIAGNOSIS — R42 Dizziness and giddiness: Secondary | ICD-10-CM

## 2014-08-13 DIAGNOSIS — I214 Non-ST elevation (NSTEMI) myocardial infarction: Secondary | ICD-10-CM

## 2014-08-13 DIAGNOSIS — F17200 Nicotine dependence, unspecified, uncomplicated: Secondary | ICD-10-CM

## 2014-08-13 DIAGNOSIS — Z720 Tobacco use: Secondary | ICD-10-CM

## 2014-08-13 MED ORDER — METOPROLOL TARTRATE 50 MG PO TABS: 50.0000 mg | ORAL_TABLET | Freq: Two times a day (BID) | ORAL | Status: DC

## 2014-08-13 MED ORDER — NITROGLYCERIN 0.4 MG/SPRAY TL SOLN: 1.0000 | Status: DC | PRN

## 2014-08-13 NOTE — Progress Notes (Signed)
PCP: Jackie Plum, MD  Clinic Note: Chief Complaint  Patient presents with  . 3 MONTH VISIT    SOME SHORTNESS OF BREATH , SOME PALP. , LITTLE SWELLING - SOME DAYS WILL TAKE A EXTRA LASIX  . Coronary Artery Disease  . Shortness of Breath    HPI: Kimberly Ramos is a 44 y.o. female with a PMH below who presents today for ~3 month f/u of CAD.Marland Kitchen She has a long-standing history of exertional dyspnea, then finally in March of 2014 she had a relook cardiac catheterization which revealed that indeed her LAD had been occluded since her initial cardiac catheterization years before. This was treated with a drug-eluting stent for subtotal proximal occlusion. Since then she has not had anywhere near the extent of exertional dyspnea. She remains morbidly obese and deconditioned however and therefore does have exertional dyspnea at baseline.  Past Medical History  Diagnosis Date  . Stroke   . Tobacco use disorder 07/07/2012  . NSTEMI (non-ST elevated myocardial infarction) 06/2012  . CAD (coronary artery disease) 06/2012    2 vessel disease old sub totaled occ. of RCA and subtotal occ. of LAD  . Presence of drug coated stent in LAD coronary artery 06/2102    for chronic subtotal LAD occlusion placed 06/2012  . Hypertension   . Asthma   . Diabetes mellitus without complication   . GERD (gastroesophageal reflux disease)   . H/O hiatal hernia   . Arthritis   . OSA on CPAP   . Morbid obesity with BMI of 45.0-49.9, adult     Prior Cardiac Evaluation and Past Surgical History: Past Surgical History  Procedure Laterality Date  . Cardiac catheterization  07/05/2012    07/05/12  + CAD  . Carpel tunnel    . Cardiac catheterization  2010    RCA stenosis with collaterals  . Myocardial perfusion imaging  07/15/2012    EF 57% lge area ant and septal ischemia  . Left heart catheterization with coronary angiogram N/A 07/05/2012    Procedure: LEFT HEART CATHETERIZATION WITH CORONARY ANGIOGRAM;  Surgeon:  Lennette Bihari, MD;  Location: [facility] CATH LAB;  Service: Cardiovascular;  LAD Subtototalled after D1 (not noticed in 2010, but present), RCA CTO.  Marland Kitchen Percutaneous coronary stent intervention (pci-s) N/A 07/20/2012    Procedure: PERCUTANEOUS CORONARY STENT INTERVENTION (PCI-S);  Surgeon: Marykay Lex, MD;  Location: [facility] CATH LAB;  PCI of LAD Promus Premier 2.25 mm x 20 mm ( 2.65 mm prox)    Interval History: Overall doing well.  Has some allergy issues.  Still a bit DOE, but nothing like prior to PCI.  Edema is better - tries to intermittently elevate feet.  Rarely takes 2nd dose.   Sleeps with head up 2/2 GERD - not Orthopnea. Is trying to be more active & watching diet - lost ~9 lb since January.  Hoping to get Medicare changed to allow [employer]  Cardiovascular ROS: positive for - dyspnea on exertion, edema and orthostatic dizziness negative for - chest pain, irregular heartbeat, loss of consciousness, orthopnea, paroxysmal nocturnal dyspnea, rapid heart rate or syncope/near syncope, TIA/amaurosis fugax symptoms  Uses CPAP - tolerating.  ROS: A comprehensive was performed. Review of Systems  HENT: Positive for congestion (runny nose wtih allergies).   Respiratory: Negative for cough.   Cardiovascular: Positive for leg swelling. Negative for palpitations.  Gastrointestinal: Negative for blood in stool and melena.  Genitourinary: Negative for hematuria.  Musculoskeletal: Positive for back pain and joint pain (  hips).  Neurological: Positive for dizziness (orthostatic). Negative for weakness.  Endo/Heme/Allergies: Bruises/bleeds easily.  Psychiatric/Behavioral: Negative.   All other systems reviewed and are negative.   Current Outpatient Prescriptions on File Prior to Visit  Medication Sig Dispense Refill  . albuterol (PROVENTIL HFA;VENTOLIN HFA) 108 (90 BASE) MCG/ACT inhaler Inhale 2 puffs into the lungs every 6 (six) hours as needed for wheezing.    Marland Kitchen aspirin EC 81 MG tablet Take  81 mg by mouth daily.    Marland Kitchen atorvastatin (LIPITOR) 80 MG tablet Take 80 mg by mouth daily.    . BD INSULIN SYRINGE ULTRAFINE 31G X 5/16" 0.3 ML MISC     . Cetirizine HCl (ZYRTEC ALLERGY PO) Take by mouth.    . EPINEPHrine (EPIPEN) 0.3 mg/0.3 mL DEVI Inject 0.3 mg into the muscle once.    . fluticasone (FLONASE) 50 MCG/ACT nasal spray Place 2 sprays into the nose daily as needed for rhinitis.    . furosemide (LASIX) 40 MG tablet Take 40 mg by mouth daily.    Marland Kitchen HYDROcodone-acetaminophen (NORCO) 7.5-325 MG per tablet Take 1 tablet by mouth every 6 (six) hours as needed.     Marland Kitchen ibuprofen (ADVIL,MOTRIN) 800 MG tablet     . insulin aspart protamine-insulin aspart (NOVOLOG 70/30) (70-30) 100 UNIT/ML injection Inject 30 Units into the skin 2 (two) times daily with a meal.    . isosorbide mononitrate (IMDUR) 60 MG 24 hr tablet Take 1 tablet (60 mg total) by mouth daily. 90 tablet 3  . lisinopril (PRINIVIL,ZESTRIL) 40 MG tablet Take 40 mg by mouth daily.     . meloxicam (MOBIC) 15 MG tablet Take 15 mg by mouth daily with breakfast.    . metroNIDAZOLE (METROGEL) 0.75 % vaginal gel Place 1 application vaginally as needed.    Marland Kitchen NEXIUM 40 MG capsule Take 1 capsule by mouth daily.    Marland Kitchen QVAR 40 MCG/ACT inhaler     . sitaGLIPtan-metformin (JANUMET) 50-500 MG per tablet Take 1 tablet by mouth 2 (two) times daily with a meal.    . solifenacin (VESICARE) 10 MG tablet Take 10 mg by mouth daily as needed (for bladder).     . ticagrelor (BRILINTA) 90 MG TABS tablet Take 1 tablet (90 mg total) by mouth 2 (two) times daily. 180 tablet 3  . ZETIA 10 MG tablet TAKE 1 TABLET BY MOUTH EVERY DAY 30 tablet 11   No current facility-administered medications on file prior to visit.   Allergies  Allergen Reactions  . Ciprofloxacin Hives and Swelling     History  Substance Use Topics  . Smoking status: Former Smoker    Quit date: 07/04/2012  . Smokeless tobacco: Never Used  . Alcohol Use: Yes     Comment: occasional    Family History  Problem Relation Age of Onset  . Diabetes Mother   . Diabetes Father   . Heart attack Maternal Grandmother   . Diabetes Maternal Grandmother      Wt Readings from Last 3 Encounters:  08/13/14 283 lb 8 oz (128.595 kg)  05/11/14 292 lb (132.45 kg)  11/17/13 292 lb (132.45 kg)    PHYSICAL EXAM BP 118/78 mmHg  Ht 5\' 5"  (1.651 m)  Wt 283 lb 8 oz (128.595 kg)  BMI 47.18 kg/m2 General appearance: alert, cooperative, appears stated age, no distress and morbidly obese Neck: no adenopathy, no carotid bruit and no JVD Lungs: clear to auscultation bilaterally, normal percussion bilaterally and non-labored Heart: regular rate and rhythm,  S1, S2 normal, no murmur, click, rub or gallop; unable to palpate PMI Abdomen: soft, non-tender; bowel sounds normal; no masses, no organomegaly; No HSM Extremities: extremities normal, atraumatic, no cyanosis, and edema - trivial  Pulses: 2+ and symmetric;  Skin: mobility and turgor normal Neurologic: Mental status: Alert, oriented, thought content appropriate Cranial nerves: normal (II-XII grossly intact)    Adult ECG Report  Rate: 74 ;  Rhythm: normal sinus rhythm  Narrative Interpretation: Normal EKG  Recent Labs:  N/A , checked by PCP (missed PCP visit last week 2/2 family funeral)  ASSESSMENT / PLAN: Problem List Items Addressed This Visit    CAD (coronary artery disease), LAD and 100% occluded RCA  -- PCI to Subtotal LAD CTO 07/04/12 DES stetn. - Primary (Chronic)    Stable - no recurrent angina & DOE much improved.  Still has CTO of RCA & is morbidly obese. Continue Brilinta, statin, BB & ACE-I.  OK to stop/hold ASA.      Relevant Medications   nitroGLYCERIN (NITROLINGUAL) 0.4 MG/SPRAY spray   metoprolol (LOPRESSOR) 50 MG tablet   Other Relevant Orders   EKG 12-Lead   Essential hypertension (Chronic)    Well controlled on current regimen.  No change      Relevant Medications   nitroGLYCERIN (NITROLINGUAL) 0.4  MG/SPRAY spray   metoprolol (LOPRESSOR) 50 MG tablet   Other Relevant Orders   EKG 12-Lead   Hyperlipidemia LDL goal <70 (Chronic)    On high dose Lipitor.  Labs followed by PCP - due for recheck soon.   Have asked for copies of labs.      Relevant Medications   nitroGLYCERIN (NITROLINGUAL) 0.4 MG/SPRAY spray   metoprolol (LOPRESSOR) 50 MG tablet   Other Relevant Orders   EKG 12-Lead   NSTEMI (non-ST elevated myocardial infarction), 07/07/12 (Chronic)    Her actual event in March 2014 was most likely related to demand ischemia - but lead to cath showing missed LAD occlusion. No further angina.  Back to baseline DOE      Relevant Medications   nitroGLYCERIN (NITROLINGUAL) 0.4 MG/SPRAY spray   metoprolol (LOPRESSOR) 50 MG tablet   Other Relevant Orders   EKG 12-Lead   Postural dizziness   Relevant Orders   EKG 12-Lead   Presence of drug coated stent in LAD coronary artery - From Prox LAD, across D1 into previously Chronically "subtotal" occluded mid LAD (Chronic)    Ok to convert to Plavix from Brilinta if it is no longer covered. OK to stop ASA. OK to hold antiplatelet agent for procedures.      Severe obesity (BMI >= 40) (Chronic)    The patient understands the need to lose weight with diet and exercise. We have discussed specific strategies for this.  She is trying to make efforts & has lost ~9 lb since January -- congratulated her.      Relevant Orders   EKG 12-Lead   Tobacco use disorder (Chronic)    Smoking cessation instruction/counseling given:  commended patient for quitting and reviewed strategies for preventing relapses         Orders Placed This Encounter  Procedures  . EKG 12-Lead   Meds ordered this encounter  Medications  . tiZANidine (ZANAFLEX) 2 MG tablet    Sig: Take by mouth every 6 (six) hours as needed for muscle spasms. Take 1-2 tablets  . nitroGLYCERIN (NITROLINGUAL) 0.4 MG/SPRAY spray    Sig: Place 1 spray under the tongue every 5 (five)  minutes as  needed for chest pain.    Dispense:  12 g    Refill:  3  . metoprolol (LOPRESSOR) 50 MG tablet    Sig: Take 1 tablet (50 mg total) by mouth 2 (two) times daily.    Dispense:  180 tablet    Refill:  3    Followup: 6 month    Divine Imber, Piedad Climes, M.D., M.S. Interventional Cardiologist   Pager # [phone]

## 2014-08-13 NOTE — Patient Instructions (Signed)
NO CHANGE WITH MEDICATION.  Your physician wants you to follow-up in 6 MONTH DR HARDING. 30 MIN APPOINTMENT. will receive a reminder letter in the mail two months in advance. If you don't receive a letter, please call our office to schedule the follow-up appointment.

## 2014-08-14 ENCOUNTER — Encounter: Payer: Self-pay | Admitting: Cardiology

## 2014-08-14 NOTE — Assessment & Plan Note (Signed)
Well-controlled on current regimen.  No change 

## 2014-08-14 NOTE — Assessment & Plan Note (Signed)
Ok to convert to Plavix from [place] if it is no longer covered. OK to stop ASA. OK to hold antiplatelet agent for procedures.

## 2014-08-14 NOTE — Assessment & Plan Note (Signed)
Stable - no recurrent angina & DOE much improved.  Still has CTO of RCA & is morbidly obese. Continue Brilinta, statin, BB & ACE-I.  OK to stop/hold ASA.

## 2014-08-14 NOTE — Assessment & Plan Note (Signed)
On high dose Lipitor.  Labs followed by PCP - due for recheck soon.   Have asked for copies of labs.

## 2014-08-14 NOTE — Assessment & Plan Note (Signed)
Her actual event in March 2014 was most likely related to demand ischemia - but lead to cath showing missed LAD occlusion. No further angina.  Back to baseline DOE

## 2014-08-15 NOTE — Assessment & Plan Note (Signed)
Smoking cessation instruction/counseling given:  commended patient for quitting and reviewed strategies for preventing relapses 

## 2014-08-15 NOTE — Assessment & Plan Note (Signed)
The patient understands the need to lose weight with diet and exercise. We have discussed specific strategies for this.  She is trying to make efforts & has lost ~9 lb since January -- congratulated her.

## 2014-08-20 ENCOUNTER — Telehealth: Payer: Self-pay | Admitting: *Deleted

## 2014-08-20 MED ORDER — NITROGLYCERIN 0.4 MG SL SUBL: 0.4000 mg | SUBLINGUAL_TABLET | SUBLINGUAL | Status: DC | PRN

## 2014-08-20 NOTE — Telephone Encounter (Signed)
RN phone in new Rx for NTG sublingual tablets instead of spray. #25 TABLET FOR 4 REFILLS.  NOTIFIED PATIENT OF THE CHANGE. SHE VERBALIZED UNDERSTANDING.

## 2014-10-04 ENCOUNTER — Telehealth: Payer: Self-pay | Admitting: *Deleted

## 2014-10-04 NOTE — Telephone Encounter (Signed)
Late entry on 10/02/2014  faxed form to Dr Clifton James LE May Stop Brilinta 5 days prior to procedure on 11/23/2014. Low risk per Dr Herbie Baltimore.

## 2015-01-08 ENCOUNTER — Telehealth: Payer: Self-pay | Admitting: Cardiology

## 2015-01-08 NOTE — Telephone Encounter (Signed)
Pt wants to know if Dr Herbie Baltimore will fill out a FL2 form please? She will explain to you what it is about.Please call asap,she is at [facility] now. If you can not get her on cell,please [phone].

## 2015-01-08 NOTE — Telephone Encounter (Signed)
No answer when called. Left message w/ instructions for faxing forms and/or dropping off in person.

## 2015-01-09 NOTE — Telephone Encounter (Signed)
Called and LMTCB to let us know that she received our message from yesterday

## 2015-01-10 ENCOUNTER — Telehealth: Payer: Self-pay | Admitting: Cardiology

## 2015-01-10 NOTE — Telephone Encounter (Signed)
Spoke with patient  She states she need a form filled out to continue to be in a program SPECIAL ASSIST/IN HOME PROGRAM INFORMED PATIENT WILL DISCUS WITH DR Herbie Baltimore AND CONTACT HER BACK SHE VERBALIZED UNDERSTANDING.

## 2015-01-10 NOTE — Telephone Encounter (Signed)
Pt wants to know if you have any questions about the paper she left,if so please call her.

## 2015-01-15 NOTE — Telephone Encounter (Signed)
NOTIFIED PATIENT INFORMATION FAXED TO [facility] [facility] [employer] SHE STATES SHE WILL COME TOMORROW AND PICK UP ORIGINAL FORM. RN STATES IT WILL BE AT FRONT DESK

## 2015-02-15 ENCOUNTER — Ambulatory Visit (INDEPENDENT_AMBULATORY_CARE_PROVIDER_SITE_OTHER): Payer: Medicare Other | Admitting: Cardiology

## 2015-02-15 VITALS — BP 138/90 | HR 62 | Ht 65.0 in | Wt 270.4 lb

## 2015-02-15 DIAGNOSIS — R609 Edema, unspecified: Secondary | ICD-10-CM

## 2015-02-15 DIAGNOSIS — E785 Hyperlipidemia, unspecified: Secondary | ICD-10-CM | POA: Diagnosis not present

## 2015-02-15 DIAGNOSIS — I2510 Atherosclerotic heart disease of native coronary artery without angina pectoris: Secondary | ICD-10-CM | POA: Diagnosis not present

## 2015-02-15 DIAGNOSIS — I214 Non-ST elevation (NSTEMI) myocardial infarction: Secondary | ICD-10-CM | POA: Diagnosis not present

## 2015-02-15 DIAGNOSIS — R0609 Other forms of dyspnea: Secondary | ICD-10-CM

## 2015-02-15 DIAGNOSIS — I10 Essential (primary) hypertension: Secondary | ICD-10-CM

## 2015-02-15 DIAGNOSIS — I272 Other secondary pulmonary hypertension: Secondary | ICD-10-CM

## 2015-02-15 DIAGNOSIS — E119 Type 2 diabetes mellitus without complications: Secondary | ICD-10-CM

## 2015-02-15 DIAGNOSIS — Z794 Long term (current) use of insulin: Secondary | ICD-10-CM

## 2015-02-15 DIAGNOSIS — R600 Localized edema: Secondary | ICD-10-CM

## 2015-02-15 NOTE — Patient Instructions (Signed)
Your physician has requested that you have an echocardiogram [address]. Echocardiography is a painless test that uses sound waves to create images of your heart. It provides your doctor with information about the size and shape of your heart and how well your heart's chambers and valves are working. This procedure takes approximately one hour. There are no restrictions for this procedure.  TRY TO FIND [facility]- [facility]   WILL CALL YOU WITH RESULTS  Your physician wants you to follow-up in 6 MONTHS WITH DR HARDING . You will receive a reminder letter in the mail two months in advance. If you don't receive a letter, please call our office to schedule the follow-up appointment.  If you need a refill on your cardiac medications before your next appointment, please call your pharmacy.

## 2015-02-15 NOTE — Progress Notes (Signed)
He   PCP: Jackie Plum, MD  Clinic Note: Chief Complaint  Patient presents with  . Follow-up    pt denied chest pain  . Shortness of Breath  . Edema    ankles  . Coronary Artery Disease    PCI to LAD    HPI: Kimberly Ramos is a 44 y.o. female with a PMH below who presents today for 6 month follow-up of her CAD status post PCI to LAD. She has a long-standing history of exertional dyspnea, then finally in March of 2014 she had a relook cardiac catheterization which revealed that indeed her LAD had been occluded since her initial cardiac catheterization years before. This was treated with a drug-eluting stent for subtotal proximal occlusion. Since then she has not had anywhere near the extent of exertional dyspnea. She remains morbidly obese and deconditioned however and therefore does have exertional dyspnea at baseline.  Kimberly Ramos was last seen in Kimberly Ramos 2016.  She is doing relatively well with just mild exertional dyspnea. Stable edema.  Recent Hospitalizations: None  Studies Reviewed: None  Interval History: Kimberly Ramos is doing pretty well. She does note that she may be a little short of breath than she used to be. She is very happy that she's lost weight by monitoring her diet. Unfortunately her hip arthritis pain is really limiting her ability to exercise. She was trying to get into the [facility] program and still hopes to try to get into the Silver sneakers if Medicaid will cover. She says that she may be a little more short of breath with exertion i.e. going up stairs or inclines on that she had been. Part of this though is can she has not really been doing as much exercise. She is not noticing anything like the dyspnea that she had arrived her PCI nor she having any resting or exertional chest tightness or pressure. No resting dyspnea. No significant PND, orthopnea to go along with her mild edema.   No palpitations, lightheadedness, dizziness, weakness or syncope/near syncope. No  TIA/amaurosis fugax symptoms. No claudication.  ROS: A comprehensive was performed. Review of Systems  Constitutional: Positive for weight loss (13 pounds). Negative for malaise/fatigue.  Respiratory: Positive for shortness of breath (Slightly increased from baseline). Negative for wheezing.   Cardiovascular: Negative for claudication.  Gastrointestinal: Negative for blood in stool and melena.  Genitourinary: Negative for hematuria.  Musculoskeletal: Positive for back pain and joint pain (Significant hip pain that limits her ability to exercise.).  Neurological: Negative for weakness and headaches.       Still has mild residual right-sided weakness from her stroke.  Endo/Heme/Allergies: Does not bruise/bleed easily.  All other systems reviewed and are negative.    Past Medical History  Diagnosis Date  . Stroke (HCC)   . Tobacco use disorder 07/07/2012  . NSTEMI (non-ST elevated myocardial infarction) (HCC) 06/2012  . CAD (coronary artery disease) 06/2012    2 vessel disease old sub totaled occ. of RCA and subtotal occ. of LAD  . Presence of drug coated stent in LAD coronary artery 06/2102    for chronic subtotal LAD occlusion placed 06/2012  . Hypertension   . Asthma   . Diabetes mellitus without complication (HCC)   . GERD (gastroesophageal reflux disease)   . H/O hiatal hernia   . Arthritis   . OSA on CPAP   . Morbid obesity with BMI of 45.0-49.9, adult [facility])     Past Surgical History  Procedure Laterality Date  . Cardiac  catheterization  07/05/2012    07/05/12  + CAD  . Carpel tunnel    . Cardiac catheterization  2010    RCA stenosis with collaterals  . Myocardial perfusion imaging  07/15/2012    EF 57% lge area ant and septal ischemia  . Left heart catheterization with coronary angiogram N/A 07/05/2012    Procedure: LEFT HEART CATHETERIZATION WITH CORONARY ANGIOGRAM;  Surgeon: Lennette Bihari, MD;  Location: [facility] CATH LAB;  Service: Cardiovascular;  LAD Subtototalled after D1  (not noticed in 2010, but present), RCA CTO.  Marland Kitchen Percutaneous coronary stent intervention (pci-s) N/A 07/20/2012    Procedure: PERCUTANEOUS CORONARY STENT INTERVENTION (PCI-S);  Surgeon: Marykay Lex, MD;  Location: [facility] CATH LAB;  PCI of LAD Promus Premier 2.25 mm x 20 mm ( 2.65 mm prox)   Prior to Admission medications   Medication Sig Start Date End Date Taking? Authorizing Provider  albuterol (PROVENTIL HFA;VENTOLIN HFA) 108 (90 BASE) MCG/ACT inhaler Inhale 2 puffs into the lungs every 6 (six) hours as needed for wheezing.   Yes Historical Provider, MD  aspirin EC 81 MG tablet Take 81 mg by mouth daily.   Yes Historical Provider, MD  atorvastatin (LIPITOR) 80 MG tablet Take 80 mg by mouth daily.   Yes Historical Provider, MD  BD INSULIN SYRINGE ULTRAFINE 31G X 5/16" 0.3 ML MISC  08/01/12  Yes Historical Provider, MD  Cetirizine HCl (ZYRTEC ALLERGY PO) Take by mouth.   Yes Historical Provider, MD  Cholecalciferol (VITAMIN D-3) 1000 UNITS CAPS Take 1 capsule by mouth daily.   Yes Historical Provider, MD  EPINEPHrine (EPIPEN) 0.3 mg/0.3 mL DEVI Inject 0.3 mg into the muscle once.   Yes Historical Provider, MD  fluticasone (FLONASE) 50 MCG/ACT nasal spray Place 2 sprays into the nose daily as needed for rhinitis.   Yes Historical Provider, MD  furosemide (LASIX) 40 MG tablet Take 40 mg by mouth daily.   Yes Historical Provider, MD  HYDROcodone-acetaminophen (NORCO) 7.5-325 MG per tablet Take 1 tablet by mouth every 6 (six) hours as needed.  09/05/12  Yes Historical Provider, MD  ibuprofen (ADVIL,MOTRIN) 800 MG tablet  11/02/12  Yes Historical Provider, MD  insulin aspart protamine-insulin aspart (NOVOLOG 70/30) (70-30) 100 UNIT/ML injection Inject 40 Units into the skin 2 (two) times daily with a meal.    Yes Historical Provider, MD  isosorbide mononitrate (IMDUR) 60 MG 24 hr tablet Take 1 tablet (60 mg total) by mouth daily. 07/24/14  Yes Marykay Lex, MD  lisinopril (PRINIVIL,ZESTRIL) 40 MG tablet  Take 40 mg by mouth daily.    Yes Historical Provider, MD  meloxicam (MOBIC) 15 MG tablet Take 15 mg by mouth daily with breakfast. 05/03/14 05/03/15 Yes Historical Provider, MD  metoprolol (LOPRESSOR) 50 MG tablet Take 1 tablet (50 mg total) by mouth 2 (two) times daily. 08/13/14  Yes Marykay Lex, MD  NEXIUM 40 MG capsule Take 1 capsule by mouth daily. 11/10/13  Yes Historical Provider, MD  nitroGLYCERIN (NITROSTAT) 0.4 MG SL tablet Place 1 tablet (0.4 mg total) under the tongue every 5 (five) minutes as needed for chest pain. 08/20/14  Yes Marykay Lex, MD  QVAR 40 MCG/ACT inhaler Inhale 2 puffs into the lungs as needed.  08/24/12  Yes Historical Provider, MD  sitaGLIPtan-metformin (JANUMET) 50-500 MG per tablet Take 1 tablet by mouth 2 (two) times daily with a meal.   Yes Historical Provider, MD  solifenacin (VESICARE) 10 MG tablet Take 10 mg by mouth  daily as needed (for bladder).    Yes Historical Provider, MD  ticagrelor (BRILINTA) 90 MG TABS tablet Take 1 tablet (90 mg total) by mouth 2 (two) times daily. 06/18/14  Yes Marykay Lex, MD  tiZANidine (ZANAFLEX) 2 MG tablet Take by mouth every 6 (six) hours as needed for muscle spasms. Take 1-2 tablets   Yes Historical Provider, MD  VOLTAREN 1 % GEL Apply 1 application topically 2 (two) times daily. 01/01/15  Yes Historical Provider, MD  ZETIA 10 MG tablet TAKE 1 TABLET BY MOUTH EVERY DAY 04/25/13  Yes Marykay Lex, MD   Allergies  Allergen Reactions  . Ciprofloxacin Hives and Swelling     Social History   Social History  . Marital Status: Married    Spouse Name: N/A  . Number of Children: N/A  . Years of Education: N/A   Social History Main Topics  . Smoking status: Former Smoker    Quit date: 07/04/2012  . Smokeless tobacco: Never Used  . Alcohol Use: Yes     Comment: occasional  . Drug Use: No  . Sexual Activity: Not Asked   Other Topics Concern  . None   Social History Narrative   Family History  Problem Relation  Age of Onset  . Diabetes Mother   . Diabetes Father   . Heart attack Maternal Grandmother   . Diabetes Maternal Grandmother     Wt Readings from Last 3 Encounters:  02/15/15 270 lb 6.4 oz (122.653 kg)  08/13/14 283 lb 8 oz (128.595 kg)  05/11/14 292 lb (132.45 kg)    PHYSICAL EXAM BP 138/90 mmHg  Pulse 62  Ht 5\' 5"  (1.651 m)  Wt 270 lb 6.4 oz (122.653 kg)  BMI 45.00 kg/m2 General appearance: alert, cooperative, appears stated age, no distress and morbidly obese Neck: no adenopathy, no carotid bruit and no JVD Lungs: clear to auscultation bilaterally, normal percussion bilaterally and non-labored Heart: regular rate and rhythm, S1, S2 normal, no murmur, click, rub or gallop; unable to palpate PMI Abdomen: soft, non-tender; bowel sounds normal; no masses, no organomegaly; No HSM Extremities: extremities normal, atraumatic, no cyanosis, and edema - trivial  Pulses: 2+ and symmetric;  Skin: mobility and turgor normal Neurologic: Mental status: Alert, oriented, thought content appropriate Cranial nerves: normal (II-XII grossly intact)   Adult ECG Report  Rate: 62 ;  Rhythm: normal sinus rhythm and Septal infarct, age undetermined (Q waves in V1 and V2 with very small R in V3); possible left atrial enlargement. Otherwise normal axis, intervals and durations.;   Narrative Interpretation: No change from Kimberly Ramos 2016  Other studies Reviewed: Additional studies/ records that were reviewed today include:  Recent Labs:  None available   ASSESSMENT / PLAN: Problem List Items Addressed This Visit    Severe obesity (BMI >= 40) (HCC) (Chronic)    In addition to 9 pounds she lost from January to [place], she has lost an additional 13 pounds making a total of 22 pounds for 8 months. This is right on track with how she should lose weight. I congratulated her efforts. Hopefully as her hip pain abates, she'll be able to get to do more exercise. I talked about trying to find a place to do water  walking or water aerobics to alleviate her hip pain.      Pulmonary HTN (HCC)    Suggested by body habitus, not noted in prior echocardiogram. Hopefully we can get a good image and good TR jet an order  to assess her pulmonary pressures as an etiology for her dyspnea.      Relevant Orders   EKG 12-Lead   ECHOCARDIOGRAM COMPLETE   Hyperlipidemia LDL goal <70 (Chronic)    On high-dose Lipitor plus Zetia. Monitored by PCP who also follows her diabetes. I have yet to receive a copy of these labs. I've asked that she request labs to be sent for monitoring.      Relevant Orders   EKG 12-Lead   ECHOCARDIOGRAM COMPLETE   History of: NSTEMI (non-ST elevated myocardial infarction), 07/07/12 (Chronic)    No active anginal symptoms consistent with her prior MI. Cannot be sure that what she had was a true acute coronary syndrome versus demand ischemia. She did however have angina at that time, has not had any further angina.      Essential hypertension (Chronic)    Borderline control today. This is high as I seen in on current medications. She is on max dose lisinopril as well as probably for the beta blocker given her heart rate. I am reluctant to add a new medication since she is on so many. Next option probably for blood pressure would be a calcium channel blocker      Relevant Orders   EKG 12-Lead   ECHOCARDIOGRAM COMPLETE   Edema extremities    On standing dose of Lasix with when necessary dosing. In the absence of PND or orthopnea, this is not likely to be related to left-sided diastolic or systolic failure. : Plan: 2-D echocardiogram will help assess left and right-sided pressures.      Relevant Orders   EKG 12-Lead   ECHOCARDIOGRAM COMPLETE   DOE (dyspnea on exertion) - Primary    Probably deconditioning and obesity related, however she has lost weight. Continues to be active. No anginal symptoms, however I'm concerned about possible diastolic heart failure/dysfunction mediated  dyspnea.  Recheck 2-D echocardiogram to assess EF and diastolic pressures. Continue standing dose of Lasix. When necessary additional dosing.      Relevant Orders   EKG 12-Lead   ECHOCARDIOGRAM COMPLETE   Diabetes mellitus type 2, in the obese -- insulin dependent,  (Chronic)   CAD (coronary artery disease), LAD and 100% occluded RCA  -- PCI to Subtotal LAD CTO 07/04/12 DES stetn. (Chronic)    No anginal symptoms, slightly worse than usual dyspnea on exertion, but nothing to suggest new stenoses. She does have RCA occlusion and could still have some exertional ischemic dyspnea from that. Plan:  Check 2-D echocardiogram to assess worsening exertional dyspnea  Continue Brilinta for LAD stent along with beta blocker, ACE inhibitor, and nitrate at current dose  On high-dose statin plus Zetia.      Relevant Orders   EKG 12-Lead   ECHOCARDIOGRAM COMPLETE      Current medicines are reviewed at length with the patient today. (+/- concerns) none The following changes have been made: None Studies Ordered:   Orders Placed This Encounter  Procedures  . EKG 12-Lead  . ECHOCARDIOGRAM COMPLETE      HARDING, Piedad Climes, M.D., M.S. Interventional Cardiologist   Pager # [phone]

## 2015-02-17 ENCOUNTER — Encounter: Payer: Self-pay | Admitting: Cardiology

## 2015-02-17 DIAGNOSIS — R600 Localized edema: Secondary | ICD-10-CM | POA: Insufficient documentation

## 2015-02-17 DIAGNOSIS — I272 Pulmonary hypertension, unspecified: Secondary | ICD-10-CM | POA: Insufficient documentation

## 2015-02-17 DIAGNOSIS — R0609 Other forms of dyspnea: Principal | ICD-10-CM

## 2015-02-17 NOTE — Assessment & Plan Note (Signed)
Probably deconditioning and obesity related, however she has lost weight. Continues to be active. No anginal symptoms, however I'm concerned about possible diastolic heart failure/dysfunction mediated dyspnea.  Recheck 2-D echocardiogram to assess EF and diastolic pressures. Continue standing dose of Lasix. When necessary additional dosing.

## 2015-02-17 NOTE — Assessment & Plan Note (Signed)
On standing dose of Lasix with when necessary dosing. In the absence of PND or orthopnea, this is not likely to be related to left-sided diastolic or systolic failure. : Plan: 2-D echocardiogram will help assess left and right-sided pressures.

## 2015-02-17 NOTE — Assessment & Plan Note (Signed)
Suggested by body habitus, not noted in prior echocardiogram. Hopefully we can get a good image and good TR jet an order to assess her pulmonary pressures as an etiology for her dyspnea.

## 2015-02-17 NOTE — Assessment & Plan Note (Signed)
In addition to 9 pounds she lost from January to Kimberly Ramos, she has lost an additional 13 pounds making a total of 22 pounds for 8 months. This is right on track with how she should lose weight. I congratulated her efforts. Hopefully as her hip pain abates, she'll be able to get to do more exercise. I talked about trying to find a place to do water walking or water aerobics to alleviate her hip pain.

## 2015-02-17 NOTE — Assessment & Plan Note (Signed)
No active anginal symptoms consistent with her prior MI. Cannot be sure that what she had was a true acute coronary syndrome versus demand ischemia. She did however have angina at that time, has not had any further angina.

## 2015-02-17 NOTE — Assessment & Plan Note (Signed)
On high-dose Lipitor plus Zetia. Monitored by PCP who also follows her diabetes. I have yet to receive a copy of these labs. I've asked that she request labs to be sent for monitoring.

## 2015-02-17 NOTE — Assessment & Plan Note (Signed)
Borderline control today. This is high as I seen in on current medications. She is on max dose lisinopril as well as probably for the beta blocker given her heart rate. I am reluctant to add a new medication since she is on so many. Next option probably for blood pressure would be a calcium channel blocker

## 2015-02-17 NOTE — Assessment & Plan Note (Signed)
No anginal symptoms, slightly worse than usual dyspnea on exertion, but nothing to suggest new stenoses. She does have RCA occlusion and could still have some exertional ischemic dyspnea from that. Plan:  Check 2-D echocardiogram to assess worsening exertional dyspnea  Continue Brilinta for LAD stent along with beta blocker, ACE inhibitor, and nitrate at current dose  On high-dose statin plus Zetia.

## 2015-02-21 ENCOUNTER — Ambulatory Visit (HOSPITAL_COMMUNITY): Payer: Medicare Other | Attending: Cardiovascular Disease

## 2015-02-21 ENCOUNTER — Other Ambulatory Visit: Payer: Self-pay

## 2015-02-21 DIAGNOSIS — R600 Localized edema: Secondary | ICD-10-CM

## 2015-02-21 DIAGNOSIS — E785 Hyperlipidemia, unspecified: Secondary | ICD-10-CM

## 2015-02-21 DIAGNOSIS — I10 Essential (primary) hypertension: Secondary | ICD-10-CM | POA: Diagnosis not present

## 2015-02-21 DIAGNOSIS — I517 Cardiomegaly: Secondary | ICD-10-CM | POA: Diagnosis not present

## 2015-02-21 DIAGNOSIS — R0609 Other forms of dyspnea: Secondary | ICD-10-CM | POA: Diagnosis not present

## 2015-02-21 DIAGNOSIS — I272 Other secondary pulmonary hypertension: Secondary | ICD-10-CM

## 2015-02-21 DIAGNOSIS — I2510 Atherosclerotic heart disease of native coronary artery without angina pectoris: Secondary | ICD-10-CM | POA: Diagnosis not present

## 2015-02-21 DIAGNOSIS — R609 Edema, unspecified: Secondary | ICD-10-CM

## 2015-04-30 ENCOUNTER — Telehealth: Payer: Self-pay | Admitting: Cardiology

## 2015-04-30 HISTORY — PX: CARDIAC CATHETERIZATION: SHX172

## 2015-04-30 NOTE — Telephone Encounter (Signed)
Pt had heart attack on Sunday morning. She said she contacted the Answering Service early yesterday morning,she still have not heard from anybody that was on call. The Answering Service assured her they would get somebody to call her. She is a pt in [place] wanted to go to [facility]. She wants Dr Herbie Baltimore to consult with Dr Michaelle Copas really wanted her procedure at [facility]. If not on her cell phone,please call her room-[phone]. Please call her asap,they going to take her downstairs for the procedure.

## 2015-04-30 NOTE — Telephone Encounter (Signed)
Called and discussed with trish the cardmaster at the hosp. She is going to call the attending.

## 2015-05-01 HISTORY — PX: PERCUTANEOUS CORONARY STENT INTERVENTION (PCI-S): SHX6016

## 2015-05-27 ENCOUNTER — Encounter: Payer: Self-pay | Admitting: Cardiology

## 2015-06-12 ENCOUNTER — Other Ambulatory Visit: Payer: Self-pay | Admitting: Cardiology

## 2015-06-12 NOTE — Telephone Encounter (Signed)
Rx request sent to pharmacy.  

## 2015-07-31 ENCOUNTER — Other Ambulatory Visit: Payer: Self-pay | Admitting: Cardiology

## 2015-07-31 NOTE — Telephone Encounter (Signed)
Rx(s) sent to pharmacy electronically.  

## 2015-09-07 ENCOUNTER — Other Ambulatory Visit: Payer: Self-pay | Admitting: Cardiology

## 2016-01-23 ENCOUNTER — Encounter: Payer: Self-pay | Admitting: Allergy and Immunology

## 2016-01-23 ENCOUNTER — Ambulatory Visit (INDEPENDENT_AMBULATORY_CARE_PROVIDER_SITE_OTHER): Payer: Medicare Other | Admitting: Allergy and Immunology

## 2016-01-23 VITALS — BP 120/90 | HR 72 | Temp 99.2°F | Resp 16 | Ht 64.57 in | Wt 297.2 lb

## 2016-01-23 DIAGNOSIS — E119 Type 2 diabetes mellitus without complications: Secondary | ICD-10-CM

## 2016-01-23 DIAGNOSIS — J3089 Other allergic rhinitis: Secondary | ICD-10-CM

## 2016-01-23 DIAGNOSIS — J4530 Mild persistent asthma, uncomplicated: Secondary | ICD-10-CM | POA: Insufficient documentation

## 2016-01-23 DIAGNOSIS — Z794 Long term (current) use of insulin: Secondary | ICD-10-CM

## 2016-01-23 DIAGNOSIS — J0100 Acute maxillary sinusitis, unspecified: Secondary | ICD-10-CM | POA: Diagnosis not present

## 2016-01-23 DIAGNOSIS — J019 Acute sinusitis, unspecified: Secondary | ICD-10-CM | POA: Insufficient documentation

## 2016-01-23 DIAGNOSIS — J4531 Mild persistent asthma with (acute) exacerbation: Secondary | ICD-10-CM

## 2016-01-23 MED ORDER — AZELASTINE HCL 0.15 % NA SOLN: 2.0000 | Freq: Two times a day (BID) | NASAL | 5 refills | Status: DC

## 2016-01-23 MED ORDER — ALBUTEROL SULFATE HFA 108 (90 BASE) MCG/ACT IN AERS: 2.0000 | INHALATION_SPRAY | RESPIRATORY_TRACT | 2 refills | Status: DC | PRN

## 2016-01-23 MED ORDER — BECLOMETHASONE DIPROPIONATE 40 MCG/ACT IN AERS: 2.0000 | INHALATION_SPRAY | Freq: Two times a day (BID) | RESPIRATORY_TRACT | 5 refills | Status: DC

## 2016-01-23 MED ORDER — PREDNISONE 1 MG PO TABS: 10.0000 mg | ORAL_TABLET | Freq: Every day | ORAL | Status: AC

## 2016-01-23 MED ORDER — FLUTICASONE PROPIONATE 50 MCG/ACT NA SUSP: 2.0000 | Freq: Every day | NASAL | 5 refills | Status: DC

## 2016-01-23 MED ORDER — OLOPATADINE HCL 0.7 % OP SOLN: 1.0000 [drp] | Freq: Every day | OPHTHALMIC | 5 refills | Status: DC | PRN

## 2016-01-23 NOTE — Progress Notes (Signed)
Follow-up Note  RE: [name] MRN: [id number] DOB: 04/01/1971 Date of Office Visit: 01/23/2016  Primary care provider: Jackie Plum, MD Referring provider: Jackie Plum, MD  History of present illness: Kimberly Ramos is a 45 y.o. female with asthma and allergic rhinitis presenting today for a sick visit.  She was last seen in this clinic in July 2016.  She reports that over the past week she has been experiencing sinus pressure, nasal congestion, thick postnasal drainage, hoarseness, and ear pressure.  In addition, she has been experiencing more frequent asthma symptoms over the past 2 weeks.  She currently uses Qvar 40 g, 2 inhalations daily without a spacer device.  She denies fevers and chills.  She also complains of ocular pruritus and requests an eye drop.   Assessment and plan: Acute sinusitis  Prednisone has been provided, 20 mg x 4 days, 10 mg x1 day, then stop.  A prescription has been provided for azelastine nasal spray, 2 sprays per nostril 2 times daily. Proper nasal spray technique has been discussed and demonstrated.   Continue fluticasone nasal spray, 2 sprays per nostril daily for now.  Nasal saline lavage (NeilMed) as needed has been recommended along with instructions for proper administration.  Add guaifenesin 1200 mg (Mucinex Maximum Strength)  twice daily as needed with adequate hydration as discussed.  The patient has been asked to contact me if his symptoms persist, progress, or if he becomes febrile. Otherwise, he may return for follow up in 4 months.  Mild persistent asthma Mild exacerbation.  Prednisone has been provided (as above).  Increase Qvar 40 g to 2 inhalations twice a day.  To maximize pulmonary deposition, a spacer has been provided along with instructions for its proper administration with an HFA inhaler.  The patient has been asked to contact me if her symptoms persist or progress.  Allergic rhinoconjunctivitis  Continue  appropriate allergen avoidance measures and fluticasone nasal spray.  A prescription and has been provided for azelastine nasal spray (as above).  A prescription has been provided for Pazeo, one drop per eye daily as needed.  If allergen avoidance measures and medications fail to adequately relieve symptoms, aeroallergen immunotherapy will be considered.  Diabetes mellitus type 2, in the obese -- insulin dependent,   The patient has been asked to carefully monitor blood glucose levels while on prednisone.  She/He has verbalized understanding and has agreed to do so.   Meds ordered this encounter  Medications  . Azelastine HCl 0.15 % SOLN    Sig: Place 2 sprays into both nostrils 2 (two) times daily.    Dispense:  30 mL    Refill:  5  . Olopatadine HCl (PAZEO) 0.7 % SOLN    Sig: Place 1 drop into both eyes daily as needed.    Dispense:  1 Bottle    Refill:  5  . beclomethasone (QVAR) 40 MCG/ACT inhaler    Sig: Inhale 2 puffs into the lungs 2 (two) times daily.    Dispense:  1 Inhaler    Refill:  5  . fluticasone (FLONASE) 50 MCG/ACT nasal spray    Sig: Place 2 sprays into both nostrils daily.    Dispense:  16 g    Refill:  5  . albuterol (PROVENTIL HFA;VENTOLIN HFA) 108 (90 Base) MCG/ACT inhaler    Sig: Inhale 2 puffs into the lungs every 4 (four) hours as needed for wheezing or shortness of breath.    Dispense:  1 Inhaler  Refill:  2  . predniSONE (DELTASONE) tablet 10 mg    Diagnostics: Spirometry:  Normal with an FEV1 of 2.52 L.  Please see scanned spirometry results for details.    Physical examination: Blood pressure 120/90, pulse 72, temperature 99.2 F (37.3 C), temperature source Oral, resp. rate 16, height 5' 4.57" (1.64 m), weight 297 lb 2.9 oz (134.8 kg), SpO2 98 %.  General: Alert, interactive, in no acute distress. HEENT: TMs pearly gray, turbinates edematous with thick discharge, post-pharynx erythematous. Neck: Supple without  lymphadenopathy. Lungs: Clear to auscultation without wheezing, rhonchi or rales. CV: Normal S1, S2 without murmurs. Skin: Warm and dry, without lesions or rashes.  The following portions of the patient's history were reviewed and updated as appropriate: allergies, current medications, past family history, past medical history, past social history, past surgical history and problem list.    Medication List       Accurate as of 01/23/16  2:00 PM. Always use your most recent med list.          amLODipine 5 MG tablet Commonly known as:  NORVASC Take 5 mg by mouth.   aspirin EC 81 MG tablet Take 81 mg by mouth daily.   atorvastatin 80 MG tablet Commonly known as:  LIPITOR Take 80 mg by mouth.   Azelastine HCl 0.15 % Soln Place 2 sprays into both nostrils 2 (two) times daily.   BD INSULIN SYRINGE ULTRAFINE 31G X 5/16" 0.3 ML Misc Generic drug:  Insulin Syringe-Needle U-100   BRILINTA 90 MG Tabs tablet Generic drug:  ticagrelor TAKE 1 TABLET (90 MG TOTAL) BY MOUTH 2 (TWO) TIMES DAILY.   EPIPEN 2-PAK 0.3 mg/0.3 mL Soaj injection Generic drug:  EPINEPHrine   FLOVENT HFA 44 MCG/ACT inhaler Generic drug:  fluticasone INHALE 2 PUFFS EVERY DAY   fluticasone 50 MCG/ACT nasal spray Commonly known as:  FLONASE Place 2 sprays into both nostrils daily.   furosemide 40 MG tablet Commonly known as:  LASIX TAKE 1 TABLET BY MOUTH EVERY DAY MAY TAKE EXTRA TAB AS NEEDED FOR WEIGHT GAIN/SWELLING   HYDROcodone-acetaminophen 7.5-325 MG tablet Commonly known as:  NORCO Take 1 tablet by mouth every 6 (six) hours as needed.   ibuprofen 800 MG tablet Commonly known as:  ADVIL,MOTRIN   insulin aspart protamine - aspart (70-30) 100 UNIT/ML FlexPen Commonly known as:  NOVOLOG 70/30 MIX Inject into the skin.   isosorbide mononitrate 60 MG 24 hr tablet Commonly known as:  IMDUR TAKE 1 TABLET (60 MG TOTAL) BY MOUTH DAILY.   metoprolol 50 MG tablet Commonly known as:  LOPRESSOR Take 1  tablet (50 mg total) by mouth 2 (two) times daily.   NEXIUM 40 MG capsule Generic drug:  esomeprazole TAKE 1 CAPSULE TWICE DAILY 30 MINUTES PRIOR TO BREAKFAST AND DINNER.   nitroGLYCERIN 0.4 MG SL tablet Commonly known as:  NITROSTAT Place 1 tablet (0.4 mg total) under the tongue every 5 (five) minutes as needed for chest pain.   Olopatadine HCl 0.7 % Soln Commonly known as:  PAZEO Place 1 drop into both eyes daily as needed.   PROAIR HFA 108 (90 Base) MCG/ACT inhaler Generic drug:  albuterol Inhale 2 puffs into the lungs every 6 (six) hours as needed for wheezing or shortness of breath.   albuterol 108 (90 Base) MCG/ACT inhaler Commonly known as:  PROVENTIL HFA;VENTOLIN HFA Inhale 2 puffs into the lungs every 4 (four) hours as needed for wheezing or shortness of breath.   QVAR 40 MCG/ACT  inhaler Generic drug:  beclomethasone Inhale 2 puffs into the lungs as needed.   beclomethasone 40 MCG/ACT inhaler Commonly known as:  QVAR Inhale 2 puffs into the lungs 2 (two) times daily.   sitaGLIPtin-metformin 50-500 MG tablet Commonly known as:  JANUMET Take by mouth.   tiZANidine 2 MG tablet Commonly known as:  ZANAFLEX Take by mouth every 6 (six) hours as needed for muscle spasms. Take 1-2 tablets   Vitamin D-3 1000 units Caps Take 1 capsule by mouth daily.   VOLTAREN 1 % Gel Generic drug:  diclofenac sodium Apply 1 application topically 2 (two) times daily.   ZYRTEC ALLERGY PO Take by mouth.       Allergies  Allergen Reactions  . Ciprofloxacin Hives and Swelling  . Lisinopril Hives and Itching    Face swelling    Review of systems: Review of systems negative except as noted in HPI / PMHx or noted below: Constitutional: Negative.  HENT: Negative.   Eyes: Negative.  Respiratory: Negative.   Cardiovascular: Negative.  Gastrointestinal: Negative.  Genitourinary: Negative.  Musculoskeletal: Negative.  Neurological: Negative.  Endo/Heme/Allergies: Negative.   Cutaneous: Negative.  Past Medical History:  Diagnosis Date  . Arthritis   . Asthma   . CAD S/P percutaneous coronary angioplasty 06/2012   2 vessel disease old sub totaled occ. of RCA and subtotal occ. of LAD  . Diabetes mellitus without complication (HCC)   . GERD (gastroesophageal reflux disease)   . H/O hiatal hernia   . Hypertension   . Morbid obesity with BMI of 45.0-49.9, adult (HCC)   . NSTEMI (non-ST elevated myocardial infarction) (HCC) 06/2012; Jan 2017  . OSA on CPAP   . Presence of drug coated stent in LAD coronary artery 06/2102; Jan 2017   a. for chronic subtotal LAD occlusion placed 06/2012; b. NSTEMI - PCI LAD-D1 bifurcation [facility])  . Stroke (HCC)   . Tobacco use disorder 07/07/2012    Family History  Problem Relation Age of Onset  . Diabetes Mother   . Diabetes Father   . Heart attack Maternal Grandmother   . Diabetes Maternal Grandmother     Social History   Social History  . Marital status: Married    Spouse name: N/A  . Number of children: N/A  . Years of education: N/A   Occupational History  . Not on file.   Social History Main Topics  . Smoking status: Former Smoker    Quit date: 07/04/2012  . Smokeless tobacco: Never Used  . Alcohol use Yes     Comment: occasional  . Drug use: No  . Sexual activity: Yes   Other Topics Concern  . Not on file   Social History Narrative  . No narrative on file    I appreciate the opportunity to take part in Aamori's care. Please do not hesitate to contact me with questions.  Sincerely,   R. Jorene Guestarter Denisha Hoel, MD

## 2016-01-23 NOTE — Assessment & Plan Note (Signed)
Mild exacerbation.  Prednisone has been provided (as above).  Increase Qvar 40 g to 2 inhalations twice a day.  To maximize pulmonary deposition, a spacer has been provided along with instructions for its proper administration with an HFA inhaler.  The patient has been asked to contact me if her symptoms persist or progress.

## 2016-01-23 NOTE — Assessment & Plan Note (Signed)
   Prednisone has been provided, 20 mg x 4 days, 10 mg x1 day, then stop.  A prescription has been provided for azelastine nasal spray, 2 sprays per nostril 2 times daily. Proper nasal spray technique has been discussed and demonstrated.   Continue fluticasone nasal spray, 2 sprays per nostril daily for now.  Nasal saline lavage (NeilMed) as needed has been recommended along with instructions for proper administration.  Add guaifenesin 1200 mg (Mucinex Maximum Strength)  twice daily as needed with adequate hydration as discussed.  The patient has been asked to contact me if his symptoms persist, progress, or if he becomes febrile. Otherwise, he may return for follow up in 4 months.

## 2016-01-23 NOTE — Patient Instructions (Addendum)
Acute sinusitis  Prednisone has been provided, 20 mg x 4 days, 10 mg x1 day, then stop.  A prescription has been provided for azelastine nasal spray, 2 sprays per nostril 2 times daily. Proper nasal spray technique has been discussed and demonstrated.   Continue fluticasone nasal spray, 2 sprays per nostril daily for now.  Nasal saline lavage (NeilMed) as needed has been recommended along with instructions for proper administration.  Add guaifenesin 1200 mg (Mucinex Maximum Strength)  twice daily as needed with adequate hydration as discussed.  The patient has been asked to contact me if his symptoms persist, progress, or if he becomes febrile. Otherwise, he may return for follow up in 4 months.  Mild persistent asthma Mild exacerbation.  Prednisone has been provided (as above).  Increase Qvar 40 g to 2 inhalations twice a day.  To maximize pulmonary deposition, a spacer has been provided along with instructions for its proper administration with an HFA inhaler.  The patient has been asked to contact me if her symptoms persist or progress.  Allergic rhinoconjunctivitis  Continue appropriate allergen avoidance measures and fluticasone nasal spray.  A prescription and has been provided for azelastine nasal spray (as above).  A prescription has been provided for Pazeo, one drop per eye daily as needed.  If allergen avoidance measures and medications fail to adequately relieve symptoms, aeroallergen immunotherapy will be considered.  Diabetes mellitus type 2, in the obese -- insulin dependent,   The patient has been asked to carefully monitor blood glucose levels while on prednisone.  She/He has verbalized understanding and has agreed to do so.   Return in about 4 months (around 05/24/2016), or if symptoms worsen or fail to improve.

## 2016-01-23 NOTE — Assessment & Plan Note (Addendum)
   Continue appropriate allergen avoidance measures and fluticasone nasal spray.  A prescription and has been provided for azelastine nasal spray (as above).  A prescription has been provided for Pazeo, one drop per eye daily as needed.  If allergen avoidance measures and medications fail to adequately relieve symptoms, aeroallergen immunotherapy will be considered.

## 2016-01-23 NOTE — Assessment & Plan Note (Signed)
   The patient has been asked to carefully monitor blood glucose levels while on prednisone.  She/He has verbalized understanding and has agreed to do so. 

## 2016-01-30 ENCOUNTER — Other Ambulatory Visit: Payer: Self-pay | Admitting: *Deleted

## 2016-02-02 ENCOUNTER — Other Ambulatory Visit: Payer: Self-pay | Admitting: Cardiology

## 2016-02-04 ENCOUNTER — Telehealth: Payer: Self-pay | Admitting: Cardiology

## 2016-02-04 NOTE — Telephone Encounter (Signed)
Called and left voicemail for the patient to call me back and schedule the appointment.

## 2016-02-11 ENCOUNTER — Telehealth: Payer: Self-pay | Admitting: Cardiology

## 2016-02-11 NOTE — Telephone Encounter (Signed)
Called patient on cell phone and left a voicemail to call me back at my number to schedule appointment with Dr. Herbie Baltimore.

## 2016-03-17 ENCOUNTER — Other Ambulatory Visit: Payer: Self-pay

## 2016-03-17 MED ORDER — ISOSORBIDE MONONITRATE ER 60 MG PO TB24: ORAL_TABLET | ORAL | 0 refills | Status: DC

## 2016-04-07 ENCOUNTER — Other Ambulatory Visit: Payer: Self-pay | Admitting: Cardiology

## 2016-04-07 NOTE — Telephone Encounter (Signed)
REFILL 

## 2016-04-12 ENCOUNTER — Encounter (HOSPITAL_BASED_OUTPATIENT_CLINIC_OR_DEPARTMENT_OTHER): Payer: Self-pay | Admitting: Emergency Medicine

## 2016-04-12 ENCOUNTER — Emergency Department (HOSPITAL_BASED_OUTPATIENT_CLINIC_OR_DEPARTMENT_OTHER)
Admission: EM | Admit: 2016-04-12 | Discharge: 2016-04-12 | Disposition: A | Discharge status: Home / Self Care | Payer: Medicare Other | Attending: Emergency Medicine | Admitting: Emergency Medicine

## 2016-04-12 DIAGNOSIS — Z7982 Long term (current) use of aspirin: Secondary | ICD-10-CM | POA: Insufficient documentation

## 2016-04-12 DIAGNOSIS — I2510 Atherosclerotic heart disease of native coronary artery without angina pectoris: Secondary | ICD-10-CM | POA: Diagnosis not present

## 2016-04-12 DIAGNOSIS — Z87891 Personal history of nicotine dependence: Secondary | ICD-10-CM | POA: Diagnosis not present

## 2016-04-12 DIAGNOSIS — E119 Type 2 diabetes mellitus without complications: Secondary | ICD-10-CM | POA: Diagnosis not present

## 2016-04-12 DIAGNOSIS — R040 Epistaxis: Secondary | ICD-10-CM | POA: Diagnosis present

## 2016-04-12 DIAGNOSIS — J4530 Mild persistent asthma, uncomplicated: Secondary | ICD-10-CM | POA: Diagnosis not present

## 2016-04-12 DIAGNOSIS — Z794 Long term (current) use of insulin: Secondary | ICD-10-CM | POA: Insufficient documentation

## 2016-04-12 DIAGNOSIS — I10 Essential (primary) hypertension: Secondary | ICD-10-CM | POA: Diagnosis not present

## 2016-04-12 MED ORDER — SILVER NITRATE-POT NITRATE 75-25 % EX MISC
1.0000 | Freq: Once | CUTANEOUS | Status: AC
  Administered 2016-04-12: 1 via TOPICAL
  Filled 2016-04-12: qty 1

## 2016-04-12 MED ORDER — OXYMETAZOLINE HCL 0.05 % NA SOLN
1.0000 | Freq: Once | NASAL | Status: AC
  Administered 2016-04-12: 1 via NASAL
  Filled 2016-04-12: qty 15

## 2016-04-12 NOTE — ED Triage Notes (Signed)
Patient states that she has had an intermittent nose bleed all day

## 2016-04-12 NOTE — ED Provider Notes (Signed)
[facility] Note   CSN: [id number] Arrival date & time: 04/12/16  1919   By signing my name below, I, Kimberly Ramos and [name], attest that this documentation has been prepared under the direction and in the presence of Jacalyn Lefevre, MD.  Electronically Signed: Octavia Heir, ED Scribe. 04/12/16. 7:48 PM.   History   Chief Complaint Chief Complaint  Patient presents with  . Epistaxis   HPI Comments: Kimberly Ramos is a 45 y.o. female who has a PMhx of GERD, DM, HTN, NSTEMI presents to the Emergency Department complaining with sudden onset, intermittent, moderate epistaxis since 3:30 am. She reports associated sinus pressure. Pt notes that she is passing blood clots as she clears her throat or blows her nose. Pt is currently on 90 mg of Brilinta which she has been on since March 2014 and takes baby aspirin. She has a hx of two MI in the past with her most previous MI being in January of 2016. Pt says that her epistaxis is similar to her prior MI. She has not taken any medication to relieve her pain.  She denies cough. Pt has not been around any smoking contacts. She is a non-smoker.    HPI  Past Medical History:  Diagnosis Date  . Arthritis   . Asthma   . CAD S/P percutaneous coronary angioplasty 06/2012   2 vessel disease old sub totaled occ. of RCA and subtotal occ. of LAD  . Diabetes mellitus without complication (HCC)   . GERD (gastroesophageal reflux disease)   . H/O hiatal hernia   . Hypertension   . Morbid obesity with BMI of 45.0-49.9, adult (HCC)   . NSTEMI (non-ST elevated myocardial infarction) (HCC) 06/2012; Jan 2017  . OSA on CPAP   . Presence of drug coated stent in LAD coronary artery 06/2102; Jan 2017   a. for chronic subtotal LAD occlusion placed 06/2012; b. NSTEMI - PCI LAD-D1 bifurcation [facility]  . Stroke (HCC)   . Tobacco use disorder 07/07/2012    Patient Active Problem List   Diagnosis Date Noted  . Acute sinusitis  01/23/2016  . Mild persistent asthma 01/23/2016  . Allergic rhinoconjunctivitis 01/23/2016  . DOE (dyspnea on exertion) 02/17/2015  . Pulmonary HTN (HCC) 02/17/2015  . Edema extremities 02/17/2015  . Severe obesity (BMI >= 40) (HCC) 04/06/2013  . Hyperlipidemia LDL goal <70 04/06/2013  . Callus of foot 09/21/2012  . Tinea pedis 09/21/2012  . Presence of drug coated stent in LAD coronary artery - From Prox LAD, across D1 into previously Chronically "subtotal" occluded mid LAD 07/21/2012  . CAD (coronary artery disease), LAD and 100% occluded RCA  -- PCI to Subtotal LAD CTO 07/04/12 DES stetn. 07/04/2012  . Diabetes mellitus type 2, in the obese -- insulin dependent,  07/04/2012  . Essential hypertension 07/04/2012  . History of: NSTEMI (non-ST elevated myocardial infarction), 07/07/12 07/04/2012    Past Surgical History:  Procedure Laterality Date  . CARDIAC CATHETERIZATION  07/05/2012   07/05/12  + CAD  . CARDIAC CATHETERIZATION  2010   RCA stenosis with collaterals  . CARDIAC CATHETERIZATION  Apr 30, 2015   [facility] - Severe LAD stenosis @ prox edge of stent --> PCI  . carpel tunnel    . LEFT HEART CATHETERIZATION WITH CORONARY ANGIOGRAM N/A 07/05/2012   Procedure: LEFT HEART CATHETERIZATION WITH CORONARY ANGIOGRAM;  Surgeon: Lennette Bihari, MD;  Location: [facility] CATH LAB;  Service: Cardiovascular;  LAD Subtototalled after D1 (  not noticed in 2010, but present), RCA CTO.  . MYOCARDIAL PERFUSION IMAGING  07/15/2012   EF 57% lge area ant and septal ischemia  . PERCUTANEOUS CORONARY STENT INTERVENTION (PCI-S) N/A 07/20/2012   Procedure: PERCUTANEOUS CORONARY STENT INTERVENTION (PCI-S);  Surgeon: Marykay Lex, MD;  Location: [facility] CATH LAB;  PCI of LAD Promus Premier 2.25 mm x 20 mm ( 2.65 mm prox)  . PERCUTANEOUS CORONARY STENT INTERVENTION (PCI-S)  May 01, 2015   [facility] - PCI of LAD (prox of prior stent), & ? D1. (DES)     OB History    No data available       Home Medications    Prior to  Admission medications   Medication Sig Start Date End Date Taking? Authorizing Provider  albuterol (PROAIR HFA) 108 (90 Base) MCG/ACT inhaler Inhale 2 puffs into the lungs every 6 (six) hours as needed for wheezing or shortness of breath.    Historical Provider, MD  albuterol (PROVENTIL HFA;VENTOLIN HFA) 108 (90 Base) MCG/ACT inhaler Inhale 2 puffs into the lungs every 4 (four) hours as needed for wheezing or shortness of breath. 01/23/16   Cristal Ford, MD  amLODipine (NORVASC) 5 MG tablet Take 5 mg by mouth.    Historical Provider, MD  aspirin EC 81 MG tablet Take 81 mg by mouth daily.    Historical Provider, MD  atorvastatin (LIPITOR) 80 MG tablet Take 80 mg by mouth.    Historical Provider, MD  Azelastine HCl 0.15 % SOLN Place 2 sprays into both nostrils 2 (two) times daily. 01/23/16   Cristal Ford, MD  BD INSULIN SYRINGE ULTRAFINE 31G X 5/16" 0.3 ML MISC  08/01/12   Historical Provider, MD  beclomethasone (QVAR) 40 MCG/ACT inhaler Inhale 2 puffs into the lungs 2 (two) times daily. 01/23/16   Cristal Ford, MD  BRILINTA 90 MG TABS tablet TAKE 1 TABLET (90 MG TOTAL) BY MOUTH 2 (TWO) TIMES DAILY. 09/09/15   Marykay Lex, MD  Cetirizine HCl (ZYRTEC ALLERGY PO) Take by mouth.    Historical Provider, MD  Cholecalciferol (VITAMIN D-3) 1000 UNITS CAPS Take 1 capsule by mouth daily.    Historical Provider, MD  EPINEPHrine (EPIPEN 2-PAK) 0.3 mg/0.3 mL IJ SOAJ injection  11/09/14   Historical Provider, MD  esomeprazole (NEXIUM) 40 MG capsule TAKE 1 CAPSULE TWICE DAILY 30 MINUTES PRIOR TO BREAKFAST AND DINNER. 10/28/14   Historical Provider, MD  fluticasone (FLONASE) 50 MCG/ACT nasal spray Place 2 sprays into both nostrils daily. 01/23/16   Cristal Ford, MD  fluticasone (FLOVENT HFA) 44 MCG/ACT inhaler INHALE 2 PUFFS EVERY DAY 11/14/14   Historical Provider, MD  furosemide (LASIX) 40 MG tablet TAKE 1 TABLET BY MOUTH EVERY DAY MAY TAKE EXTRA TAB AS NEEDED FOR WEIGHT GAIN/SWELLING  12/23/15   Historical Provider, MD  HYDROcodone-acetaminophen (NORCO) 7.5-325 MG per tablet Take 1 tablet by mouth every 6 (six) hours as needed.  09/05/12   Historical Provider, MD  ibuprofen (ADVIL,MOTRIN) 800 MG tablet  11/02/12   Historical Provider, MD  insulin aspart protamine - aspart (NOVOLOG 70/30 MIX) (70-30) 100 UNIT/ML FlexPen Inject into the skin.    Historical Provider, MD  isosorbide mononitrate (IMDUR) 60 MG 24 hr tablet Take 1 tablet (60 mg total) by mouth daily. NEED OV. 04/07/16   Marykay Lex, MD  metoprolol (LOPRESSOR) 50 MG tablet Take 1 tablet (50 mg total) by mouth 2 (two) times daily. 08/13/14   Marykay Lex, MD  nitroGLYCERIN (  NITROSTAT) 0.4 MG SL tablet Place 1 tablet (0.4 mg total) under the tongue every 5 (five) minutes as needed for chest pain. 08/20/14   Marykay Lex, MD  Olopatadine HCl (PAZEO) 0.7 % SOLN Place 1 drop into both eyes daily as needed. 01/23/16   Cristal Ford, MD  QVAR 40 MCG/ACT inhaler Inhale 2 puffs into the lungs as needed.  08/24/12   Historical Provider, MD  sitaGLIPtin-metformin (JANUMET) 50-500 MG tablet Take by mouth.    Historical Provider, MD  tiZANidine (ZANAFLEX) 2 MG tablet Take by mouth every 6 (six) hours as needed for muscle spasms. Take 1-2 tablets    Historical Provider, MD  VOLTAREN 1 % GEL Apply 1 application topically 2 (two) times daily. 01/01/15   Historical Provider, MD    Family History Family History  Problem Relation Age of Onset  . Diabetes Mother   . Diabetes Father   . Heart attack Maternal Grandmother   . Diabetes Maternal Grandmother     Social History Social History  Substance Use Topics  . Smoking status: Former Smoker    Quit date: 07/04/2012  . Smokeless tobacco: Never Used  . Alcohol use Yes     Comment: occasional     Allergies   Ciprofloxacin and Lisinopril   Review of Systems Review of Systems  A complete 10 system review of systems was obtained and all systems are negative except as  noted in the HPI and PMH.    Physical Exam Updated Vital Signs BP 146/96 (BP Location: Right Arm)   Pulse 81   Temp 98.7 F (37.1 C) (Oral)   Resp 20   Ht 5\' 5"  (1.651 m)   Wt (!) 302 lb (137 kg)   SpO2 98%   BMI 50.26 kg/m   Physical Exam  Constitutional: She is oriented to person, place, and time. She appears well-developed and well-nourished.  HENT:  Head: Normocephalic.  Epistaxis from right nare  Eyes: EOM are normal.  Neck: Normal range of motion.  Cardiovascular: Normal rate, normal heart sounds and intact distal pulses.   Pulmonary/Chest: Effort normal and breath sounds normal.  Abdominal: Soft. She exhibits no distension.  Musculoskeletal: Normal range of motion.  Neurological: She is alert and oriented to person, place, and time.  Psychiatric: She has a normal mood and affect.  Nursing note and vitals reviewed.    ED Treatments / Results  DIAGNOSTIC STUDIES: Oxygen Saturation is 98% on RA, norma; by my interpretation.  COORDINATION OF CARE:  7:42 PM Discussed treatment plan with pt at bedside and pt agreed to plan.  Labs (all labs ordered are listed, but only abnormal results are displayed) Labs Reviewed - No data to display  EKG  EKG Interpretation None       Radiology No results found.  Procedures Procedures (including critical care time)  Medications Ordered in ED Medications - No data to display   Initial Impression / Assessment and Plan / ED Course  I have reviewed the triage vital signs and the nursing notes.  Pertinent labs & imaging results that were available during my care of the patient were reviewed by me and considered in my medical decision making (see chart for details).  Clinical Course     Afrin sprayed in right nare.  Pt observed and no further bleeding.  Pt has a neti pot at home.  She is encouraged to return if worse.  Final Clinical Impressions(s) / ED Diagnoses   Final diagnoses:  None  I personally performed  the services described in this documentation, which was scribed in my presence. The recorded information has been reviewed and is accurate.  New Prescriptions New Prescriptions   No medications on file     Jacalyn LefevreJulie Eulas Schweitzer, MD 04/12/16 2047

## 2016-04-13 ENCOUNTER — Other Ambulatory Visit: Payer: Self-pay

## 2016-04-13 ENCOUNTER — Other Ambulatory Visit: Payer: Self-pay | Admitting: *Deleted

## 2016-04-13 DIAGNOSIS — J0100 Acute maxillary sinusitis, unspecified: Secondary | ICD-10-CM

## 2016-04-13 DIAGNOSIS — J3089 Other allergic rhinitis: Secondary | ICD-10-CM

## 2016-04-13 MED ORDER — ISOSORBIDE MONONITRATE ER 60 MG PO TB24: 60.0000 mg | ORAL_TABLET | Freq: Every day | ORAL | 2 refills | Status: DC

## 2016-04-13 MED ORDER — FLUTICASONE PROPIONATE 50 MCG/ACT NA SUSP: 2.0000 | Freq: Every day | NASAL | 1 refills | Status: DC

## 2016-04-13 MED ORDER — AZELASTINE HCL 0.15 % NA SOLN: 2.0000 | Freq: Two times a day (BID) | NASAL | 1 refills | Status: DC

## 2016-04-13 MED ORDER — BECLOMETHASONE DIPROPIONATE 40 MCG/ACT IN AERS: 2.0000 | INHALATION_SPRAY | Freq: Two times a day (BID) | RESPIRATORY_TRACT | 1 refills | Status: DC

## 2016-04-15 ENCOUNTER — Telehealth: Payer: Self-pay | Admitting: *Deleted

## 2016-04-15 MED ORDER — ISOSORBIDE MONONITRATE ER 60 MG PO TB24: 60.0000 mg | ORAL_TABLET | Freq: Every day | ORAL | 0 refills | Status: DC

## 2016-04-15 NOTE — Telephone Encounter (Signed)
[facility] wants the imdur changed to a 90 day, will change with an appt reminder. DUE TO THE RX BELOW BEING FOR 30/2 WHICH IS 90 I AGREED SO INSURANCE WOULD PAY. MADE SURE TO PUT APPT REMINDER FOR PT.   isosorbide mononitrate (IMDUR) 60 MG 24 hr tablet  Medication  Date: 04/13/2016 Department: [facility] Ordering/Authorizing: Marykay Lex, MD  Order Providers   Prescribing Provider Encounter Provider  Marykay Lex, MD Beryle Quant, CMA  Medication Detail    Disp Refills Start End   isosorbide mononitrate (IMDUR) 60 MG 24 hr tablet 30 tablet 2 04/13/2016    Sig - Route: Take 1 tablet (60 mg total) by mouth daily. NEED OV. - Oral   Notes to Pharmacy: PLEASE MAKE APPOINTMENT FOR FURTHER REFILLS , you are overdue   E-Prescribing Status: Receipt confirmed by pharmacy (04/13/2016 11:29 AM EST)   Pharmacy   [id number] - [place], [place] - [address] AT [address]OSS FROM [address]

## 2016-04-16 ENCOUNTER — Other Ambulatory Visit: Payer: Self-pay

## 2016-04-16 MED ORDER — ISOSORBIDE MONONITRATE ER 60 MG PO TB24: 60.0000 mg | ORAL_TABLET | Freq: Every day | ORAL | 0 refills | Status: DC

## 2016-04-17 ENCOUNTER — Other Ambulatory Visit: Payer: Self-pay

## 2016-05-27 ENCOUNTER — Ambulatory Visit (INDEPENDENT_AMBULATORY_CARE_PROVIDER_SITE_OTHER): Payer: Medicare Other | Admitting: Allergy and Immunology

## 2016-05-27 ENCOUNTER — Encounter: Payer: Self-pay | Admitting: Allergy and Immunology

## 2016-05-27 VITALS — BP 126/86 | HR 68 | Temp 98.2°F | Resp 18

## 2016-05-27 DIAGNOSIS — J0100 Acute maxillary sinusitis, unspecified: Secondary | ICD-10-CM

## 2016-05-27 DIAGNOSIS — R040 Epistaxis: Secondary | ICD-10-CM | POA: Diagnosis not present

## 2016-05-27 DIAGNOSIS — J4530 Mild persistent asthma, uncomplicated: Secondary | ICD-10-CM

## 2016-05-27 DIAGNOSIS — J3089 Other allergic rhinitis: Secondary | ICD-10-CM | POA: Diagnosis not present

## 2016-05-27 MED ORDER — AZELASTINE HCL 0.15 % NA SOLN: 2.0000 | Freq: Two times a day (BID) | NASAL | 3 refills | Status: DC

## 2016-05-27 NOTE — Progress Notes (Signed)
Follow-up Note  RE: Kimberly Ramos MRN: [id number] DOB: 21-Nov-1970 Date of Office Visit: 05/27/2016  Primary care provider: Jackie Plum, MD Referring provider: Jackie Plum, MD  History of present illness: Kimberly Ramos is a 46 y.o. female with asthma and allergic rhinitis presenting today for follow up.  She was last seen in this clinic in September 2017.  In the interval since her previous visit her asthma has been well controlled.  She has not required albuterol rescue over the past 2 months and denies nocturnal awakenings due to lower respiratory symptoms.  She currently takes Qvar 40 g, 2 inhalations via spacer device twice a day.  He does complain of nasal congestion, sinus pressure, dry nose, and occasional epistaxis.  She believes that these symptoms are primarily caused by her CPAP machine.  She is uncertain of the humidity setting on the CPAP machine.  She had a go to the emergency department in mid December for epistaxes which she was unable to stanch.     Assessment and plan: Mild persistent asthma Well-controlled, we will stepdown therapy at this time.  Decrease Qvar 40 g to one inhalation via spacer device twice a day.  If lower respiratory symptoms progress in frequency and/or severity, the patient is to resume the previous dose.  Continue albuterol HFA, 1-2 inhalations every 4-6 hours as needed.  Subjective and objective measures of pulmonary function will be followed and the treatment plan will be adjusted accordingly.  Allergic rhinoconjunctivitis  Continue appropriate allergen avoidance measures.  A prescription and has been provided for azelastine nasal spray (as above).  I have also recommended nasal saline spray (i.e. Simply Saline) as needed prior to medicated nasal sprays.  Continue Pazeo, one drop per eye daily as needed.  If allergen avoidance measures and medications fail to adequately relieve symptoms, aeroallergen immunotherapy will be  considered.  Epistaxis  Proper technique for stanching epistaxis has been discussed and demonstrated.  Nasal saline spray and/or nasal saline gel is recommended to moisturize nasal mucosa.  Samples have been provided.  Increase the humidifier setting on CPAP.  During epistaxis, oxymetazoline may be used to help stanch blood flow if needed.  If this problem persists or progresses, otolaryngology evaluation may be warranted to search for/cauterize the culprit vessel.   Diagnostics: Spirometry:  Normal with an FEV1 of 113% predicted.  Please see scanned spirometry results for details.    Physical examination: Blood pressure 126/86, pulse 68, temperature 98.2 F (36.8 C), temperature source Oral, resp. rate 18.  General: Alert, interactive, in no acute distress. HEENT: TMs pearly gray, turbinates moderately edematous without discharge, post-pharynx mildly erythematous. Neck: Supple without lymphadenopathy. Lungs: Clear to auscultation without wheezing, rhonchi or rales. CV: Normal S1, S2 without murmurs. Skin: Warm and dry, without lesions or rashes.  The following portions of the patient's history were reviewed and updated as appropriate: allergies, current medications, past family history, past medical history, past social history, past surgical history and problem list.  Allergies as of 05/27/2016      Reactions   Ciprofloxacin Hives, Swelling   Lisinopril Hives, Itching   Face swelling       Medication List       Accurate as of 05/27/16  1:47 PM. Always use your most recent med list.          amLODipine 5 MG tablet Commonly known as:  NORVASC Take 5 mg by mouth.   aspirin EC 81 MG tablet Take 81 mg by mouth daily.  atorvastatin 80 MG tablet Commonly known as:  LIPITOR Take 80 mg by mouth.   Azelastine HCl 0.15 % Soln Place 2 sprays into both nostrils 2 (two) times daily.   BD INSULIN SYRINGE ULTRAFINE 31G X 5/16" 0.3 ML Misc Generic drug:  Insulin  Syringe-Needle U-100   beclomethasone 40 MCG/ACT inhaler Commonly known as:  QVAR Inhale 2 puffs into the lungs 2 (two) times daily.   BRILINTA 90 MG Tabs tablet Generic drug:  ticagrelor TAKE 1 TABLET (90 MG TOTAL) BY MOUTH 2 (TWO) TIMES DAILY.   EPIPEN 2-PAK 0.3 mg/0.3 mL Soaj injection Generic drug:  EPINEPHrine   FLOVENT HFA 44 MCG/ACT inhaler Generic drug:  fluticasone INHALE 2 PUFFS EVERY DAY   fluticasone 50 MCG/ACT nasal spray Commonly known as:  FLONASE Place 2 sprays into both nostrils daily.   furosemide 40 MG tablet Commonly known as:  LASIX TAKE 1 TABLET BY MOUTH EVERY DAY MAY TAKE EXTRA TAB AS NEEDED FOR WEIGHT GAIN/SWELLING   HYDROcodone-acetaminophen 7.5-325 MG tablet Commonly known as:  NORCO Take 1 tablet by mouth every 6 (six) hours as needed.   ibuprofen 800 MG tablet Commonly known as:  ADVIL,MOTRIN   insulin aspart protamine - aspart (70-30) 100 UNIT/ML FlexPen Commonly known as:  NOVOLOG 70/30 MIX Inject into the skin.   isosorbide mononitrate 60 MG 24 hr tablet Commonly known as:  IMDUR Take 1 tablet (60 mg total) by mouth daily.   metoprolol 50 MG tablet Commonly known as:  LOPRESSOR Take 1 tablet (50 mg total) by mouth 2 (two) times daily.   NEXIUM 40 MG capsule Generic drug:  esomeprazole TAKE 1 CAPSULE TWICE DAILY 30 MINUTES PRIOR TO BREAKFAST AND DINNER.   nitroGLYCERIN 0.4 MG SL tablet Commonly known as:  NITROSTAT Place 1 tablet (0.4 mg total) under the tongue every 5 (five) minutes as needed for chest pain.   Olopatadine HCl 0.7 % Soln Commonly known as:  PAZEO Place 1 drop into both eyes daily as needed.   PROAIR HFA 108 (90 Base) MCG/ACT inhaler Generic drug:  albuterol Inhale 2 puffs into the lungs every 6 (six) hours as needed for wheezing or shortness of breath.   albuterol 108 (90 Base) MCG/ACT inhaler Commonly known as:  PROVENTIL HFA;VENTOLIN HFA Inhale 2 puffs into the lungs every 4 (four) hours as needed for  wheezing or shortness of breath.   sitaGLIPtin-metformin 50-500 MG tablet Commonly known as:  JANUMET Take by mouth.   tiZANidine 2 MG tablet Commonly known as:  ZANAFLEX Take by mouth every 6 (six) hours as needed for muscle spasms. Take 1-2 tablets   Vitamin D-3 1000 units Caps Take 1 capsule by mouth daily.   VOLTAREN 1 % Gel Generic drug:  diclofenac sodium Apply 1 application topically 2 (two) times daily.   ZYRTEC ALLERGY PO Take by mouth.       Allergies  Allergen Reactions  . Ciprofloxacin Hives and Swelling  . Lisinopril Hives and Itching    Face swelling    Review of systems: Review of systems negative except as noted in HPI / PMHx or noted below: Constitutional: Negative.  HENT: Negative.   Eyes: Negative.  Respiratory: Negative.   Cardiovascular: Negative.  Gastrointestinal: Negative.  Genitourinary: Negative.  Musculoskeletal: Negative.  Neurological: Negative.  Endo/Heme/Allergies: Negative.  Cutaneous: Negative.  Past Medical History:  Diagnosis Date  . Arthritis   . Asthma   . CAD S/P percutaneous coronary angioplasty 06/2012   2 vessel disease old sub totaled  occ. of RCA and subtotal occ. of LAD  . Diabetes mellitus without complication (HCC)   . GERD (gastroesophageal reflux disease)   . H/O hiatal hernia   . Hypertension   . Morbid obesity with BMI of 45.0-49.9, adult (HCC)   . NSTEMI (non-ST elevated myocardial infarction) (HCC) 06/2012; Jan 2017  . OSA on CPAP   . Presence of drug coated stent in LAD coronary artery 06/2102; Jan 2017   a. for chronic subtotal LAD occlusion placed 06/2012; b. NSTEMI - PCI LAD-D1 bifurcation [facility])  . Stroke (HCC)   . Tobacco use disorder 07/07/2012    Family History  Problem Relation Age of Onset  . Diabetes Mother   . Diabetes Father   . Heart attack Maternal Grandmother   . Diabetes Maternal Grandmother     Social History   Social History  . Marital status: Married    Spouse name:  N/A  . Number of children: N/A  . Years of education: N/A   Occupational History  . Not on file.   Social History Main Topics  . Smoking status: Former Smoker    Quit date: 07/04/2012  . Smokeless tobacco: Never Used  . Alcohol use Yes     Comment: occasional  . Drug use: No  . Sexual activity: Yes   Other Topics Concern  . Not on file   Social History Narrative  . No narrative on file    I appreciate the opportunity to take part in Cindi's care. Please do not hesitate to contact me with questions.  Sincerely,   R. Jorene Guest, MD

## 2016-05-27 NOTE — Assessment & Plan Note (Signed)
Well-controlled, we will stepdown therapy at this time.  Decrease Qvar 40 g to one inhalation via spacer device twice a day.  If lower respiratory symptoms progress in frequency and/or severity, the patient is to resume the previous dose.  Continue albuterol HFA, 1-2 inhalations every 4-6 hours as needed.  Subjective and objective measures of pulmonary function will be followed and the treatment plan will be adjusted accordingly. 

## 2016-05-27 NOTE — Assessment & Plan Note (Signed)
   Proper technique for stanching epistaxis has been discussed and demonstrated.  Nasal saline spray and/or nasal saline gel is recommended to moisturize nasal mucosa.  Samples have been provided.  Increase the humidifier setting on CPAP.  During epistaxis, oxymetazoline may be used to help stanch blood flow if needed.  If this problem persists or progresses, otolaryngology evaluation may be warranted to search for/cauterize the culprit vessel.

## 2016-05-27 NOTE — Assessment & Plan Note (Signed)
   Continue appropriate allergen avoidance measures.  A prescription and has been provided for azelastine nasal spray (as above).  I have also recommended nasal saline spray (i.e. Simply Saline) as needed prior to medicated nasal sprays.  Continue Pazeo, one drop per eye daily as needed.  If allergen avoidance measures and medications fail to adequately relieve symptoms, aeroallergen immunotherapy will be considered.

## 2016-05-27 NOTE — Patient Instructions (Addendum)
Mild persistent asthma Well-controlled, we will stepdown therapy at this time.  Decrease Qvar 40 g to one inhalation via spacer device twice a day.  If lower respiratory symptoms progress in frequency and/or severity, the patient is to resume the previous dose.  Continue albuterol HFA, 1-2 inhalations every 4-6 hours as needed.  Subjective and objective measures of pulmonary function will be followed and the treatment plan will be adjusted accordingly.  Allergic rhinoconjunctivitis  Continue appropriate allergen avoidance measures.  A prescription and has been provided for azelastine nasal spray (as above).  I have also recommended nasal saline spray (i.e. Simply Saline) as needed prior to medicated nasal sprays.  Continue Pazeo, one drop per eye daily as needed.  If allergen avoidance measures and medications fail to adequately relieve symptoms, aeroallergen immunotherapy will be considered.  Epistaxis  Proper technique for stanching epistaxis has been discussed and demonstrated.  Nasal saline spray and/or nasal saline gel is recommended to moisturize nasal mucosa.  Samples have been provided.  Increase the humidifier setting on CPAP.  During epistaxis, oxymetazoline may be used to help stanch blood flow if needed.  If this problem persists or progresses, otolaryngology evaluation may be warranted to search for/cauterize the culprit vessel.   Return in about 4 months (around 09/24/2016), or if symptoms worsen or fail to improve.

## 2016-06-17 ENCOUNTER — Other Ambulatory Visit: Payer: Self-pay | Admitting: *Deleted

## 2016-06-17 MED ORDER — BECLOMETHASONE DIPROP HFA 40 MCG/ACT IN AERB: 2.0000 | INHALATION_SPRAY | Freq: Two times a day (BID) | RESPIRATORY_TRACT | 3 refills | Status: DC

## 2016-06-22 ENCOUNTER — Telehealth: Payer: Self-pay

## 2016-06-22 MED ORDER — BUDESONIDE 90 MCG/ACT IN AEPB: 2.0000 | INHALATION_SPRAY | Freq: Two times a day (BID) | RESPIRATORY_TRACT | 3 refills | Status: DC

## 2016-06-22 NOTE — Telephone Encounter (Signed)
Qvar 40 mcg. 2 inhalations 2 times daily

## 2016-06-22 NOTE — Telephone Encounter (Signed)
-----   Message from Cristal Ford, MD sent at 06/22/2016  1:41 PM EST ----- Regarding: RE: Medication Question What dose of Qvar is the patient on? Also, please send this type of request through "patient calls" area. Thanks.  ----- Message ----- From: Exie Parody, CMA Sent: 06/22/2016  11:48 AM To: Cristal Ford, MD Subject: Medication Question                            Patient's insurance will not cover Qvar. They prefer Pulmicort. Please advise and thank you.

## 2016-06-22 NOTE — Telephone Encounter (Signed)
Script sent in

## 2016-06-22 NOTE — Telephone Encounter (Signed)
Pulmicort Flexhaler 90 g, 2 inhalations twice a day.  Thanks.

## 2016-07-02 ENCOUNTER — Ambulatory Visit (INDEPENDENT_AMBULATORY_CARE_PROVIDER_SITE_OTHER): Payer: Medicare Other | Admitting: Podiatry

## 2016-07-02 ENCOUNTER — Encounter: Payer: Self-pay | Admitting: Podiatry

## 2016-07-02 VITALS — Ht 65.0 in | Wt 304.0 lb

## 2016-07-02 DIAGNOSIS — M722 Plantar fascial fibromatosis: Secondary | ICD-10-CM

## 2016-07-02 DIAGNOSIS — M216X1 Other acquired deformities of right foot: Secondary | ICD-10-CM | POA: Diagnosis not present

## 2016-07-02 DIAGNOSIS — M79671 Pain in right foot: Secondary | ICD-10-CM | POA: Diagnosis not present

## 2016-07-02 MED ORDER — NABUMETONE 500 MG PO TABS: 500.0000 mg | ORAL_TABLET | Freq: Two times a day (BID) | ORAL | 1 refills | Status: DC

## 2016-07-02 NOTE — Patient Instructions (Addendum)
Seen for pain in right heel. Noted of tight Achilles tendon on right > left. Need daily stretch exercise as instructed. May benefit from Cortisone injection, Custom orthotics. Will use Night Splint for morning pain. Will try diabetic shoes if requested. Return in 3 weeks.

## 2016-07-02 NOTE — Progress Notes (Signed)
SUBJECTIVE: 46 y.o. year old female presents complaining of back of right heel hurts especially if take shoes off for duration of a month. Also pain in right bunion area for over a year. Pain is better if rest and propped up the foot. Been soaking, changing shoes, and has taken Hydrocodone for knee pain. Nothing helped.  Been having arthritic knee pain on both for over 4 years. Gets injections at times. Does not work but very active always doing something. Make it worse if exercise. Blood sugar is running high with more elevated A1c. Patient does not know the number.  REVIEW OF SYSTEMS: A comprehensive review of systems was negative except for: Heart disease, Asthma. Type II IDDM.   OBJECTIVE: DERMATOLOGIC EXAMINATION: No abnormal findings.   VASCULAR EXAMINATION OF LOWER LIMBS: All pedal pulses are palpable with normal pulsation.  Capillary Filling times within 3 seconds in all digits.  No edema or erythema noted. Temperature gradient from tibial crest to dorsum of foot is within normal bilateral.  NEUROLOGIC EXAMINATION OF THE LOWER LIMBS: All epicritic and tactile sensations grossly intact. Sharp and Dull discriminatory sensations at the plantar ball of hallux is intact bilateral.   MUSCULOSKELETAL EXAMINATION: Positive for Tight Achilles tendon R>L. Pain under the plantar fat pad right heel with prolonged weight bearing. Positive for painful bunion R>L.  ASSESSMENT: Planar fasciitis right. Ankle equinus right. Difficulty walking.  Symptomatic right foot bunion.  PLAN: Reviewed clinical findings and available treatment options, injection, NSAIA, Orthotics, exercise, change in shoe gear. Night Splint dispensed with instruction.  Stretch exercise reviewed to do daily.  May try diabetic shoes if custom orthotics are not feasible.  Return in 3 weeks.

## 2016-07-23 ENCOUNTER — Ambulatory Visit: Payer: Medicare Other | Admitting: Podiatry

## 2016-07-27 DIAGNOSIS — G4733 Obstructive sleep apnea (adult) (pediatric): Secondary | ICD-10-CM | POA: Diagnosis not present

## 2016-07-29 ENCOUNTER — Encounter: Payer: Self-pay | Admitting: Podiatry

## 2016-07-29 ENCOUNTER — Ambulatory Visit (INDEPENDENT_AMBULATORY_CARE_PROVIDER_SITE_OTHER): Payer: Medicare Other | Admitting: Podiatry

## 2016-07-29 DIAGNOSIS — M216X1 Other acquired deformities of right foot: Secondary | ICD-10-CM

## 2016-07-29 DIAGNOSIS — M216X2 Other acquired deformities of left foot: Secondary | ICD-10-CM | POA: Diagnosis not present

## 2016-07-29 DIAGNOSIS — E1140 Type 2 diabetes mellitus with diabetic neuropathy, unspecified: Secondary | ICD-10-CM

## 2016-07-29 DIAGNOSIS — M722 Plantar fascial fibromatosis: Secondary | ICD-10-CM

## 2016-07-29 NOTE — Progress Notes (Signed)
SUBJECTIVE: 46 y.o. year old female presents stating that Night Splint, Relafen and occasional pain medication has helped a lot. Been on feet a little more than usual over the weekend and bad foot pain came back. Having more shooting and burning pain in both back of heels and bottom of heels. Patient request for diabetic shoes.  HPI: Been having pain in back of right heel hurts especially if take shoes off for duration of a month. Also pain in right bunion area for over a year. Pain is better if rest and propped up the foot. Been soaking, changing shoes, and has taken Hydrocodone for knee pain. Nothing helped.  Been having arthritic knee pain on both for over 4 years. Gets injections at times. Does not work but very active always doing something. Make it worse if exercise. Blood sugar is running high with more elevated A1c. Patient does not know the number.  REVIEW OF SYSTEMS: A comprehensive review of systems was negative except for: Heart disease, Asthma. Type II IDDM.   OBJECTIVE: DERMATOLOGIC EXAMINATION: No abnormal findings.   VASCULAR EXAMINATION OF LOWER LIMBS: All pedal pulses are palpable with normal pulsation.  Capillary Filling times within 3 seconds in all digits.  No edema or erythema noted. Temperature gradient from tibial crest to dorsum of foot is within normal bilateral.  NEUROLOGIC EXAMINATION OF THE LOWER LIMBS: All epicritic and tactile sensations grossly intact. Abnormal shooting pain and abnormal sensation in back of heel and plantar surface bilateral.  MUSCULOSKELETAL EXAMINATION: Positive for Tight Achilles tendon R>L. Pain under the plantar fat pad right heel with prolonged weight bearing. Positive for painful bunion R>L.  ASSESSMENT: Planar fasciitis right. Ankle equinus right. Difficulty walking.  Symptomatic right foot bunion. Uncontrolled diabetic. Hyperalgic paresthetic foot bilateral.  PLAN: Reviewed clinical findings and available  treatment options, injection, NSAIA, Orthotics, exercise, change in shoe gear. Continue with Night Splint and stretch exercise as reviewed.  Both feet measured for diabetic shoes. Return in one month if pain continues. May change Relafen to Meloxicam.

## 2016-07-29 NOTE — Patient Instructions (Signed)
Both feet measured for diabetic shoes. Continue stretch exercise, lower limb exercise, Night Splint. Return in one month if fail to improve.

## 2016-08-06 DIAGNOSIS — G4733 Obstructive sleep apnea (adult) (pediatric): Secondary | ICD-10-CM | POA: Diagnosis not present

## 2016-08-07 ENCOUNTER — Other Ambulatory Visit: Payer: Self-pay | Admitting: Cardiology

## 2016-08-07 NOTE — Telephone Encounter (Signed)
REFILL 

## 2016-08-11 ENCOUNTER — Encounter: Payer: Self-pay | Admitting: Podiatry

## 2016-08-11 ENCOUNTER — Ambulatory Visit (INDEPENDENT_AMBULATORY_CARE_PROVIDER_SITE_OTHER): Payer: Medicare Other | Admitting: Podiatry

## 2016-08-11 DIAGNOSIS — M216X1 Other acquired deformities of right foot: Secondary | ICD-10-CM | POA: Diagnosis not present

## 2016-08-11 DIAGNOSIS — E1140 Type 2 diabetes mellitus with diabetic neuropathy, unspecified: Secondary | ICD-10-CM | POA: Diagnosis not present

## 2016-08-11 DIAGNOSIS — M722 Plantar fascial fibromatosis: Secondary | ICD-10-CM

## 2016-08-11 NOTE — Progress Notes (Signed)
SUBJECTIVE: 46 y.o.year old femalepresents stating that she is having severe heel pain since she spent a lot of hours on her feet during weekend. Patient is seeking an injection on right heel. Still using Night Splint, doing stretch exercise if she could, and takes Relafen. Also came in to pick up diabetic shoes.  HPI: Been having pain in back of right heel hurts especially if take shoes off for duration of a month. Also pain in right bunion area for over a year. Pain is better if rest and propped up the foot. Been soaking, changing shoes, and has taken Hydrocodone for knee pain. Nothing helped.  Been having arthritic knee pain on both for over 4 years. Gets injections at times. Does not work but very active always doing something. Make it worse if exercise. Blood sugar is running high with more elevated A1c. Patient does not know the number.  REVIEW OF SYSTEMS: A comprehensive review of systems was negative except for: Heart disease, Asthma. Type II IDDM.   OBJECTIVE: DERMATOLOGIC EXAMINATION: No abnormal findings.   VASCULAR EXAMINATION OF LOWER LIMBS: All pedal pulses are palpable with normal pulsation.  Capillary Filling times within 3 seconds in all digits.  No edema or erythema noted. Temperature gradient from tibial crest to dorsum of foot is within normal bilateral.  NEUROLOGIC EXAMINATION OF THE LOWER LIMBS: All epicritic and tactile sensations grossly intact. Abnormal shooting pain and abnormal sensation in back of heel and plantar surface bilateral.  MUSCULOSKELETAL EXAMINATION: Positive for Tight Achilles tendon R>L. Pain under the plantar fat pad right heel with prolonged weight bearing. Positive for painful bunion R>L.  ASSESSMENT: Planar fasciitis right. Ankle equinus right. Difficulty walking.  Symptomatic right foot bunion. Uncontrolled diabetic. Hyperalgic paresthetic foot bilateral.  PLAN: As per request right heel injected with mixture of 4 mg  Dexamethasone, 4 mg Triamcinolone, and 1 cc of 0.5% Marcaine plain. Patient tolerated well without difficulty.  Patient is to return if pain persist. Diabetic shoe dispensed. Return in one month if pain continues. May change Relafen to Meloxicam

## 2016-08-11 NOTE — Patient Instructions (Addendum)
Ordered Diabetic shoe dispensed. Cortisone injection to right heel given. Continue with Daily stretch exercise and Relafen as needed. Call if need to change or refill on Relafen.

## 2016-08-28 DIAGNOSIS — Z8673 Personal history of transient ischemic attack (TIA), and cerebral infarction without residual deficits: Secondary | ICD-10-CM | POA: Diagnosis not present

## 2016-08-28 DIAGNOSIS — R000 Tachycardia, unspecified: Secondary | ICD-10-CM | POA: Diagnosis not present

## 2016-08-28 DIAGNOSIS — I501 Left ventricular failure: Secondary | ICD-10-CM | POA: Diagnosis not present

## 2016-08-28 DIAGNOSIS — Z7984 Long term (current) use of oral hypoglycemic drugs: Secondary | ICD-10-CM | POA: Diagnosis not present

## 2016-08-28 DIAGNOSIS — I2510 Atherosclerotic heart disease of native coronary artery without angina pectoris: Secondary | ICD-10-CM | POA: Diagnosis not present

## 2016-08-28 DIAGNOSIS — I249 Acute ischemic heart disease, unspecified: Secondary | ICD-10-CM | POA: Diagnosis not present

## 2016-08-28 DIAGNOSIS — I959 Hypotension, unspecified: Secondary | ICD-10-CM | POA: Diagnosis not present

## 2016-08-28 DIAGNOSIS — G4733 Obstructive sleep apnea (adult) (pediatric): Secondary | ICD-10-CM | POA: Diagnosis not present

## 2016-08-28 DIAGNOSIS — J811 Chronic pulmonary edema: Secondary | ICD-10-CM | POA: Diagnosis not present

## 2016-08-28 DIAGNOSIS — I5021 Acute systolic (congestive) heart failure: Secondary | ICD-10-CM | POA: Diagnosis not present

## 2016-08-28 DIAGNOSIS — I25118 Atherosclerotic heart disease of native coronary artery with other forms of angina pectoris: Secondary | ICD-10-CM | POA: Diagnosis not present

## 2016-08-28 DIAGNOSIS — Z794 Long term (current) use of insulin: Secondary | ICD-10-CM | POA: Diagnosis not present

## 2016-08-28 DIAGNOSIS — R079 Chest pain, unspecified: Secondary | ICD-10-CM | POA: Diagnosis not present

## 2016-08-28 DIAGNOSIS — Z7982 Long term (current) use of aspirin: Secondary | ICD-10-CM | POA: Diagnosis not present

## 2016-08-28 DIAGNOSIS — R0789 Other chest pain: Secondary | ICD-10-CM | POA: Diagnosis not present

## 2016-08-28 DIAGNOSIS — Z87891 Personal history of nicotine dependence: Secondary | ICD-10-CM | POA: Diagnosis not present

## 2016-08-28 DIAGNOSIS — Z9861 Coronary angioplasty status: Secondary | ICD-10-CM | POA: Diagnosis not present

## 2016-08-28 DIAGNOSIS — M5430 Sciatica, unspecified side: Secondary | ICD-10-CM | POA: Diagnosis not present

## 2016-08-28 DIAGNOSIS — T82867A Thrombosis of cardiac prosthetic devices, implants and grafts, initial encounter: Secondary | ICD-10-CM | POA: Diagnosis not present

## 2016-08-28 DIAGNOSIS — E784 Other hyperlipidemia: Secondary | ICD-10-CM | POA: Diagnosis not present

## 2016-08-28 DIAGNOSIS — E119 Type 2 diabetes mellitus without complications: Secondary | ICD-10-CM | POA: Diagnosis not present

## 2016-08-28 DIAGNOSIS — I110 Hypertensive heart disease with heart failure: Secondary | ICD-10-CM | POA: Diagnosis not present

## 2016-08-28 DIAGNOSIS — I119 Hypertensive heart disease without heart failure: Secondary | ICD-10-CM | POA: Diagnosis not present

## 2016-08-28 DIAGNOSIS — I214 Non-ST elevation (NSTEMI) myocardial infarction: Secondary | ICD-10-CM | POA: Diagnosis not present

## 2016-08-28 DIAGNOSIS — J45909 Unspecified asthma, uncomplicated: Secondary | ICD-10-CM | POA: Diagnosis not present

## 2016-08-28 DIAGNOSIS — R570 Cardiogenic shock: Secondary | ICD-10-CM | POA: Diagnosis not present

## 2016-08-28 DIAGNOSIS — E876 Hypokalemia: Secondary | ICD-10-CM | POA: Diagnosis not present

## 2016-08-28 DIAGNOSIS — I669 Occlusion and stenosis of unspecified cerebral artery: Secondary | ICD-10-CM | POA: Diagnosis not present

## 2016-08-28 DIAGNOSIS — R9431 Abnormal electrocardiogram [ECG] [EKG]: Secondary | ICD-10-CM | POA: Diagnosis not present

## 2016-08-28 DIAGNOSIS — I5020 Unspecified systolic (congestive) heart failure: Secondary | ICD-10-CM | POA: Diagnosis not present

## 2016-08-28 DIAGNOSIS — E785 Hyperlipidemia, unspecified: Secondary | ICD-10-CM | POA: Diagnosis not present

## 2016-08-28 DIAGNOSIS — Z955 Presence of coronary angioplasty implant and graft: Secondary | ICD-10-CM | POA: Diagnosis not present

## 2016-08-28 DIAGNOSIS — I25110 Atherosclerotic heart disease of native coronary artery with unstable angina pectoris: Secondary | ICD-10-CM | POA: Diagnosis not present

## 2016-08-28 DIAGNOSIS — I252 Old myocardial infarction: Secondary | ICD-10-CM | POA: Diagnosis not present

## 2016-09-09 ENCOUNTER — Other Ambulatory Visit: Payer: Self-pay

## 2016-09-14 DIAGNOSIS — N760 Acute vaginitis: Secondary | ICD-10-CM | POA: Diagnosis not present

## 2016-09-18 DIAGNOSIS — G4733 Obstructive sleep apnea (adult) (pediatric): Secondary | ICD-10-CM | POA: Diagnosis not present

## 2016-09-22 DIAGNOSIS — G4733 Obstructive sleep apnea (adult) (pediatric): Secondary | ICD-10-CM | POA: Diagnosis not present

## 2016-09-23 DIAGNOSIS — I252 Old myocardial infarction: Secondary | ICD-10-CM | POA: Diagnosis not present

## 2016-09-23 DIAGNOSIS — I10 Essential (primary) hypertension: Secondary | ICD-10-CM | POA: Diagnosis not present

## 2016-09-23 DIAGNOSIS — E785 Hyperlipidemia, unspecified: Secondary | ICD-10-CM | POA: Diagnosis not present

## 2016-09-23 DIAGNOSIS — I2510 Atherosclerotic heart disease of native coronary artery without angina pectoris: Secondary | ICD-10-CM | POA: Diagnosis not present

## 2016-09-24 ENCOUNTER — Ambulatory Visit: Payer: Medicare Other | Admitting: Allergy and Immunology

## 2016-09-28 DIAGNOSIS — E1165 Type 2 diabetes mellitus with hyperglycemia: Secondary | ICD-10-CM | POA: Diagnosis not present

## 2016-09-28 DIAGNOSIS — Z0000 Encounter for general adult medical examination without abnormal findings: Secondary | ICD-10-CM | POA: Diagnosis not present

## 2016-09-28 DIAGNOSIS — Z01118 Encounter for examination of ears and hearing with other abnormal findings: Secondary | ICD-10-CM | POA: Diagnosis not present

## 2016-09-28 DIAGNOSIS — I10 Essential (primary) hypertension: Secondary | ICD-10-CM | POA: Diagnosis not present

## 2016-09-28 DIAGNOSIS — Z131 Encounter for screening for diabetes mellitus: Secondary | ICD-10-CM | POA: Diagnosis not present

## 2016-09-28 DIAGNOSIS — Z136 Encounter for screening for cardiovascular disorders: Secondary | ICD-10-CM | POA: Diagnosis not present

## 2016-09-28 DIAGNOSIS — N183 Chronic kidney disease, stage 3 (moderate): Secondary | ICD-10-CM | POA: Diagnosis not present

## 2016-09-28 DIAGNOSIS — Z5181 Encounter for therapeutic drug level monitoring: Secondary | ICD-10-CM | POA: Diagnosis not present

## 2016-10-02 DIAGNOSIS — E1165 Type 2 diabetes mellitus with hyperglycemia: Secondary | ICD-10-CM | POA: Diagnosis not present

## 2016-10-02 DIAGNOSIS — Z5181 Encounter for therapeutic drug level monitoring: Secondary | ICD-10-CM | POA: Diagnosis not present

## 2016-10-02 DIAGNOSIS — M545 Low back pain: Secondary | ICD-10-CM | POA: Diagnosis not present

## 2016-10-02 DIAGNOSIS — I10 Essential (primary) hypertension: Secondary | ICD-10-CM | POA: Diagnosis not present

## 2016-10-02 DIAGNOSIS — E785 Hyperlipidemia, unspecified: Secondary | ICD-10-CM | POA: Diagnosis not present

## 2016-10-02 DIAGNOSIS — N183 Chronic kidney disease, stage 3 (moderate): Secondary | ICD-10-CM | POA: Diagnosis not present

## 2016-10-14 ENCOUNTER — Ambulatory Visit (INDEPENDENT_AMBULATORY_CARE_PROVIDER_SITE_OTHER): Payer: Medicare Other | Admitting: Allergy & Immunology

## 2016-10-14 ENCOUNTER — Encounter: Payer: Self-pay | Admitting: Allergy & Immunology

## 2016-10-14 VITALS — BP 126/84 | HR 84 | Temp 98.0°F | Resp 16

## 2016-10-14 DIAGNOSIS — J4530 Mild persistent asthma, uncomplicated: Secondary | ICD-10-CM | POA: Diagnosis not present

## 2016-10-14 DIAGNOSIS — I10 Essential (primary) hypertension: Secondary | ICD-10-CM | POA: Diagnosis not present

## 2016-10-14 DIAGNOSIS — I214 Non-ST elevation (NSTEMI) myocardial infarction: Secondary | ICD-10-CM | POA: Diagnosis not present

## 2016-10-14 DIAGNOSIS — J302 Other seasonal allergic rhinitis: Secondary | ICD-10-CM

## 2016-10-14 DIAGNOSIS — G4733 Obstructive sleep apnea (adult) (pediatric): Secondary | ICD-10-CM | POA: Diagnosis not present

## 2016-10-14 DIAGNOSIS — E118 Type 2 diabetes mellitus with unspecified complications: Secondary | ICD-10-CM | POA: Diagnosis not present

## 2016-10-14 DIAGNOSIS — I2510 Atherosclerotic heart disease of native coronary artery without angina pectoris: Secondary | ICD-10-CM | POA: Diagnosis not present

## 2016-10-14 MED ORDER — EPINEPHRINE 0.3 MG/0.3ML IJ SOAJ: INTRAMUSCULAR | 1 refills | Status: AC

## 2016-10-14 NOTE — Progress Notes (Signed)
FOLLOW UP  Date of Service/Encounter:  10/14/16   Assessment:   Mild persistent asthma without complication  Other seasonal allergic rhinitis  Complex medical history (recurrent strokes, MI)  Disabled  On beta blocker therapy   Asthma Reportables:  Severity: mild persistent  Risk: high Control: well controlled   Plan/Recommendations:   1. Mild persistent asthma without complication - Lung testing looked good today.   - We will not make any changes at this time. - Daily controller medication(s): Pulmicort two puffs twice daily - Rescue medications: Proventil 4 puffs every 4-6 hours as needed - Changes during respiratory infections or worsening symptoms: increase Pulmicort to 2 puffs three times daily for TWO WEEKS. - Asthma control goals:  * Full participation in all desired activities (may need albuterol before activity) * Albuterol use two time or less a week on average (not counting use with activity) * Cough interfering with sleep two time or less a month * Oral steroids no more than once a year * No hospitalizations  2. Seasonal allergic rhinitis (grasses, weeds, ragweed, trees, mold) - with last testing in Kimberly Ramos 2013 - Continue with azelastine two sprays per nostril 1-2 times daily.   - Continue with Zyrtec 1-2 times daily as needed.  - We will refill your EpiPen.  - Allergy shot consent signed. - I will let Dr. Rod Can know that you are interested. - We will need updated testing since the last testing was over five years old.  - She is on a beta blocker, therefore a risk versus benefit discussion will have to be done prior to starting immunotherapy.   3. Return in about 6 months (around 04/15/2017) for a regular appointment and one month for skin testing.   Subjective:   Kimberly Ramos is a 46 y.o. female presenting today for follow up of  Chief Complaint  Patient presents with  . Allergic Rhinitis   . Asthma  . Cough    Kimberly Ramos  has a history of the following: Patient Active Problem List   Diagnosis Date Noted  . Epistaxis 05/27/2016  . Acute sinusitis 01/23/2016  . Mild persistent asthma 01/23/2016  . Allergic rhinoconjunctivitis 01/23/2016  . DOE (dyspnea on exertion) 02/17/2015  . Pulmonary HTN (HCC) 02/17/2015  . Edema extremities 02/17/2015  . Severe obesity (BMI >= 40) (HCC) 04/06/2013  . Hyperlipidemia LDL goal <70 04/06/2013  . Callus of foot 09/21/2012  . Tinea pedis 09/21/2012  . Presence of drug coated stent in LAD coronary artery - From Prox LAD, across D1 into previously Chronically "subtotal" occluded mid LAD 07/21/2012  . CAD (coronary artery disease), LAD and 100% occluded RCA  -- PCI to Subtotal LAD CTO 07/04/12 DES stetn. 07/04/2012  . Diabetes mellitus type 2, in the obese -- insulin dependent,  07/04/2012  . Essential hypertension 07/04/2012  . History of: NSTEMI (non-ST elevated myocardial infarction), 07/07/12 07/04/2012    History obtained from: chart review and patient.  Kimberly Ramos was referred by Kimberly Plum, MD.     Kimberly Ramos is a 46 y.o. female presenting for a follow up visit. She was last seen in January 2018. She has a history of asthma and allergic rhinitis. For her asthma, her Qvar was decreased to 40 g 1 puff twice daily. Her allergies were fairly well-controlled with as lasting nasal spray, Pazeo eyedrops, and nasal saline. She was having epistaxis at the last visit, and correct administration of the nasal sprays was discussed.  Since  the last visit, she has mostly done well. Her insurance stopped covering Qvar with the change in the delivery system. Therefore, instead of being changed to Flovent, it seems that she was changed to Pulmicort. Currently, she is taking Pulmicort 90 g 2 puffs twice daily. She feels or this is working just as well as Qvar. Kimberly Ramos asthma has been well controlled. She has not required rescue medication, experienced nocturnal awakenings due to lower  respiratory symptoms, nor have activities of daily living been limited. She has required no Emergency Department or Urgent Care visits for her asthma. She has required zero courses of systemic steroids for asthma exacerbations since the last visit.   She did have some breathing problems possibly 2 weeks ago. She was exposed to cut grass and developed sneezing and coughing. She started taking her medications and took an extra dose of Zyrtec with improvement. She did not require prednisone. This is the worst time of year for her symptoms overall. She remains on azelastine 2 sprays per nostril 1-2 times daily as well as cetirizine 10 mg daily. In the past, her symptoms of and so severe that she currently carries an EpiPen. She does need a refill on the EpiPen. She has never been on allergy shots, but is interested today.  Otherwise, there have been no changes to her past medical history, surgical history, family history, or social history. She is on disability for three strokes in 2005 as well as now three heart attacks. She had a stent put in last year and even had a lot while on blood thinners.     Review of Systems: a 14-point review of systems is pertinent for what is mentioned in HPI.  Otherwise, all other systems were negative. Constitutional: negative other than that listed in the HPI Eyes: negative other than that listed in the HPI Ears, nose, mouth, throat, and face: negative other than that listed in the HPI Respiratory: negative other than that listed in the HPI Cardiovascular: negative other than that listed in the HPI Gastrointestinal: negative other than that listed in the HPI Genitourinary: negative other than that listed in the HPI Integument: negative other than that listed in the HPI Hematologic: negative other than that listed in the HPI Musculoskeletal: negative other than that listed in the HPI Neurological: negative other than that listed in the HPI Allergy/Immunologic:  negative other than that listed in the HPI    Objective:   Blood pressure 126/84, pulse 84, temperature 98 F (36.7 C), temperature source Oral, resp. rate 16, SpO2 98 %. There is no height or weight on file to calculate BMI.   Physical Exam:  General: Alert, interactive, in no acute distress. Very pleasant and interactive female. Eyes: No conjunctival injection present on the right, No conjunctival injection present on the left, PERRL bilaterally, No discharge on the right, No discharge on the left and No Horner-Trantas dots present Ears: Right TM pearly gray with normal light reflex, Left TM pearly gray with normal light reflex, Right TM intact without perforation and Left TM intact without perforation.  Nose/Throat: External nose within normal limits and septum midline, turbinates markedly edematous and pale with clear discharge, post-pharynx erythematous with cobblestoning in the posterior oropharynx. Tonsils 2+ without exudates Neck: Supple without thyromegaly. Lungs: Clear to auscultation without wheezing, rhonchi or rales. No increased work of breathing. CV: Normal S1/S2, no murmurs. Capillary refill <2 seconds.  Skin: Warm and dry, without lesions or rashes. Neuro:   Grossly intact. No focal deficits appreciated.  Responsive to questions.   Diagnostic studies:   Spirometry: results normal (FEV1: 2.50/100%, FVC: 3.02/98%, FEV1/FVC: 83%).    Spirometry consistent with normal pattern.   Allergy Studies: none     Malachi Bonds, MD [facility]

## 2016-10-14 NOTE — Patient Instructions (Addendum)
1. Mild persistent asthma without complication - Lung testing looked good today.   - We will not make any changes at this time. - Daily controller medication(s): Pulmicort two puffs twice daily - Rescue medications: Proventil 4 puffs every 4-6 hours as needed - Changes during respiratory infections or worsening symptoms: increase Pulmicort to 2 puffs three times daily for TWO WEEKS. - Asthma control goals:  * Full participation in all desired activities (may need albuterol before activity) * Albuterol use two time or less a week on average (not counting use with activity) * Cough interfering with sleep two time or less a month * Oral steroids no more than once a year * No hospitalizations  2. Seasonal allergic rhinitis  - Continue with azelastine two sprays per nostril 1-2 times daily.   - Continue with Zyrtec 1-2 times daily as needed.  - We will refill your EpiPen.  - Allergy shot consent signed. - I will let Dr. Rod Can know that you are interested and he can send in the prescription.   3. Return in about 6 months (around 04/15/2017).  Please inform us of any Emergency Department visits, hospitalizations, or changes in symptoms. Call us before going to the ED for breathing or allergy symptoms since we might be able to fit you in for a sick visit. Feel free to contact us anytime with any questions, problems, or concerns.  It was a pleasure to meet you today! Happy summer!   Websites that have reliable patient information: 1. American Academy of Asthma, Allergy, and Immunology: www.aaaai.org 2. Food Allergy Research and Education (FARE): foodallergy.org 3. Mothers of Asthmatics: http://www.asthmacommunitynetwork.org 4. American College of Allergy, Asthma, and Immunology: www.acaai.org

## 2016-10-19 DIAGNOSIS — G4733 Obstructive sleep apnea (adult) (pediatric): Secondary | ICD-10-CM | POA: Diagnosis not present

## 2016-10-22 DIAGNOSIS — G4733 Obstructive sleep apnea (adult) (pediatric): Secondary | ICD-10-CM | POA: Diagnosis not present

## 2016-11-02 DIAGNOSIS — Z955 Presence of coronary angioplasty implant and graft: Secondary | ICD-10-CM | POA: Diagnosis not present

## 2016-11-02 DIAGNOSIS — S93402A Sprain of unspecified ligament of left ankle, initial encounter: Secondary | ICD-10-CM | POA: Diagnosis not present

## 2016-11-02 DIAGNOSIS — Z8673 Personal history of transient ischemic attack (TIA), and cerebral infarction without residual deficits: Secondary | ICD-10-CM | POA: Diagnosis not present

## 2016-11-02 DIAGNOSIS — I2510 Atherosclerotic heart disease of native coronary artery without angina pectoris: Secondary | ICD-10-CM | POA: Diagnosis not present

## 2016-11-02 DIAGNOSIS — M7989 Other specified soft tissue disorders: Secondary | ICD-10-CM | POA: Diagnosis not present

## 2016-11-02 DIAGNOSIS — M25572 Pain in left ankle and joints of left foot: Secondary | ICD-10-CM | POA: Diagnosis not present

## 2016-11-02 DIAGNOSIS — I252 Old myocardial infarction: Secondary | ICD-10-CM | POA: Diagnosis not present

## 2016-11-02 DIAGNOSIS — Z87891 Personal history of nicotine dependence: Secondary | ICD-10-CM | POA: Diagnosis not present

## 2016-11-02 DIAGNOSIS — J45909 Unspecified asthma, uncomplicated: Secondary | ICD-10-CM | POA: Diagnosis not present

## 2016-11-02 DIAGNOSIS — G4730 Sleep apnea, unspecified: Secondary | ICD-10-CM | POA: Diagnosis not present

## 2016-11-02 DIAGNOSIS — E119 Type 2 diabetes mellitus without complications: Secondary | ICD-10-CM | POA: Diagnosis not present

## 2016-11-02 DIAGNOSIS — I10 Essential (primary) hypertension: Secondary | ICD-10-CM | POA: Diagnosis not present

## 2016-11-09 DIAGNOSIS — J45909 Unspecified asthma, uncomplicated: Secondary | ICD-10-CM | POA: Diagnosis not present

## 2016-11-09 DIAGNOSIS — E785 Hyperlipidemia, unspecified: Secondary | ICD-10-CM | POA: Diagnosis not present

## 2016-11-09 DIAGNOSIS — M1710 Unilateral primary osteoarthritis, unspecified knee: Secondary | ICD-10-CM | POA: Diagnosis not present

## 2016-11-09 DIAGNOSIS — E1165 Type 2 diabetes mellitus with hyperglycemia: Secondary | ICD-10-CM | POA: Diagnosis not present

## 2016-11-09 DIAGNOSIS — I10 Essential (primary) hypertension: Secondary | ICD-10-CM | POA: Diagnosis not present

## 2016-11-11 DIAGNOSIS — R112 Nausea with vomiting, unspecified: Secondary | ICD-10-CM | POA: Diagnosis not present

## 2016-11-11 DIAGNOSIS — K219 Gastro-esophageal reflux disease without esophagitis: Secondary | ICD-10-CM | POA: Diagnosis not present

## 2016-11-11 DIAGNOSIS — R1013 Epigastric pain: Secondary | ICD-10-CM | POA: Diagnosis not present

## 2016-11-12 ENCOUNTER — Encounter: Payer: Self-pay | Admitting: Allergy and Immunology

## 2016-11-12 ENCOUNTER — Ambulatory Visit (INDEPENDENT_AMBULATORY_CARE_PROVIDER_SITE_OTHER): Payer: Medicare Other | Admitting: Allergy and Immunology

## 2016-11-12 VITALS — BP 100/66 | HR 68 | Temp 98.2°F | Resp 20

## 2016-11-12 DIAGNOSIS — R040 Epistaxis: Secondary | ICD-10-CM

## 2016-11-12 DIAGNOSIS — H1010 Acute atopic conjunctivitis, unspecified eye: Secondary | ICD-10-CM | POA: Insufficient documentation

## 2016-11-12 DIAGNOSIS — Z91018 Allergy to other foods: Secondary | ICD-10-CM | POA: Diagnosis not present

## 2016-11-12 DIAGNOSIS — J4530 Mild persistent asthma, uncomplicated: Secondary | ICD-10-CM

## 2016-11-12 DIAGNOSIS — J3089 Other allergic rhinitis: Secondary | ICD-10-CM | POA: Diagnosis not present

## 2016-11-12 DIAGNOSIS — H1013 Acute atopic conjunctivitis, bilateral: Secondary | ICD-10-CM

## 2016-11-12 MED ORDER — OLOPATADINE HCL 0.2 % OP SOLN: 1.0000 [drp] | OPHTHALMIC | 5 refills | Status: DC

## 2016-11-12 MED ORDER — AZELASTINE HCL 0.15 % NA SOLN: 1.0000 | Freq: Two times a day (BID) | NASAL | 3 refills | Status: DC | PRN

## 2016-11-12 NOTE — Assessment & Plan Note (Signed)
   Nasal saline spray and/or nasal saline gel is recommended to moisturize nasal mucosa.    Maintain elevated humidifier setting on CPAP.  Oxymetazoline may be used to help stanch blood flow if needed.  If this problem persists or progresses, otolaryngology evaluation may be warranted to search for/cauterize the culprit vessel.

## 2016-11-12 NOTE — Assessment & Plan Note (Signed)
Well-controlled.  Continue Pulmicort Flexhaler 90 g, 2 inhalations twice a day, and albuterol HFA, 1-2 inhalations every 4-6 hours as needed.  Subjective and objective measures of pulmonary function will be followed and the treatment plan will be adjusted accordingly.

## 2016-11-12 NOTE — Progress Notes (Signed)
Follow-up Note  RE: Kimberly Ramos MRN: [id number] DOB: May 03, 1970 Date of Office Visit: 11/12/2016  Primary care provider: Jackie Plum, MD Referring provider: Jackie Plum, MD  History of present illness: Kimberly Ramos is a 46 y.o. female with allergic rhinoconjunctivitis and persistent asthma presenting today for follow up and allergy skin testing in anticipation of starting aeroallergen immunotherapy.   She reports that in the interval since her previous visit her asthma has been well controlled.  She rarely requires albuterol rescue and denies nocturnal awakenings due to lower respiratory symptoms.   She has been experiencing epistaxis less frequently and has found benefit using oxymetazoline on a cotton ball when she does have nosebleeds.   Having held cetirizine over the past several days in anticipation of today's skin testing, she has noted increased nasal congestion, rhinorrhea, postnasal drainage, throat clearing, and ocular pruritus. She is interested in starting aeroallergen immunotherapy to reduce symptoms and decrease medication requirement.  Kimberly Ramos states that she has developed a rash in the past while consuming [place] food and a rash on her face and chest with the consumption of tomatoes.  She denies concomitant cardiopulmonary or GI symptoms.   Assessment and plan: Allergic rhinitis  Aeroallergen avoidance measures have been discussed and provided in written form.  A prescription has been provided for levocetirizine, 5mg  daily as needed.  Azelastine nasal spray, 2 sprays per nostril 1-2 times daily as needed.  I have also recommended nasal saline spray (i.e., Simply Saline) or nasal saline lavage (i.e., NeilMed) as needed and prior to medicated nasal sprays.  The risks and benefits of aeroallergen immunotherapy have been discussed. The additional risk of immunotherapy while on a beta-blocker has been discussed as well. The patient is motivated to initiate  immunotherapy to reduce symptoms and decrease medication requirement. Informed consent has been signed and allergen vaccine orders will be submitted. Medications will be decreased or discontinued as symptom relief from immunotherapy becomes evident.  Allergic conjunctivitis  Treatment plan as outlined above for allergic rhinitis.  Continue Pazeo, one drop per eye daily as needed.  I have also recommended eye lubricant drops (i.e., Natural Tears) as needed.  Mild persistent asthma Well-controlled.  Continue Pulmicort Flexhaler 90 g, 2 inhalations twice a day, and albuterol HFA, 1-2 inhalations every 4-6 hours as needed.  Subjective and objective measures of pulmonary function will be followed and the treatment plan will be adjusted accordingly.  Epistaxis  Nasal saline spray and/or nasal saline gel is recommended to moisturize nasal mucosa.    Maintain elevated humidifier setting on CPAP.  Oxymetazoline may be used to help stanch blood flow if needed.  If this problem persists or progresses, otolaryngology evaluation may be warranted to search for/cauterize the culprit vessel.  History of food allergy Skin tests to select food allergens were negative today. The negative predictive value of food allergen skin testing is excellent (approximately 95%). While this does not appear to be an IgE mediated issue, skin testing does not rule out food intolerances. These etiologies are suggested when elimination of the responsible food leads to symptom resolution and re-introduction of the food is followed by the return of symptoms.  The case of rash with tomatoes and [place] food may represent cutaneous response to acidic foods and MSG, respectively.  The patient has been encouraged to keep a careful symptom/food journal and eliminate any food suspected of correlating with symptoms.   An information sheet regarding salicylate-containing foods has been provided.  She is to monitor symptom  in  context of salicylate intake.  Should symptoms concerning for anaphylaxis occur, epinephrine is to be administered and 911 called immediately.   Meds ordered this encounter  Medications  . Olopatadine HCl (PATADAY) 0.2 % SOLN    Sig: Place 1 drop into both eyes 1 day or 1 dose.    Dispense:  1 Bottle    Refill:  5  . Azelastine HCl 0.15 % SOLN    Sig: Place 1 spray into the nose 2 (two) times daily as needed.    Dispense:  30 mL    Refill:  3    Diagnostics: Spirometry:  Normal with an FEV1 of 94% predicted.  Please see scanned spirometry results for details.    Physical examination: Blood pressure 100/66, pulse 68, temperature 98.2 F (36.8 C), temperature source Oral, resp. rate 20, SpO2 98 %.  General: Alert, interactive, in no acute distress. HEENT: TMs pearly gray, turbinates moderately edematous with clear discharge, post-pharynx mildly erythematous. Neck: Supple without lymphadenopathy. Lungs: Clear to auscultation without wheezing, rhonchi or rales. CV: Normal S1, S2 without murmurs. Skin: Warm and dry, without lesions or rashes.  The following portions of the patient's history were reviewed and updated as appropriate: allergies, current medications, past family history, past medical history, past social history, past surgical history and problem list.  Allergies as of 11/12/2016      Reactions   Ciprofloxacin Hives, Swelling   Lisinopril Hives, Itching   Face swelling  Face swelling  Face swelling       Medication List       Accurate as of 11/12/16 12:39 PM. Always use your most recent med list.          albuterol 108 (90 Base) MCG/ACT inhaler Commonly known as:  PROVENTIL HFA;VENTOLIN HFA Inhale into the lungs.   aspirin EC 81 MG tablet Take 81 mg by mouth daily.   atorvastatin 80 MG tablet Commonly known as:  LIPITOR Take 80 mg by mouth at bedtime.   Azelastine HCl 0.15 % Soln Place 1 spray into the nose 2 (two) times daily as needed.   BD  INSULIN SYRINGE ULTRAFINE 31G X 5/16" 0.3 ML Misc Generic drug:  Insulin Syringe-Needle U-100   Budesonide 90 MCG/ACT inhaler Inhale into the lungs.   carvedilol 25 MG tablet Commonly known as:  COREG Take 25 mg by mouth 2 (two) times daily with a meal.   carvedilol 25 MG tablet Commonly known as:  COREG Take 25 mg by mouth.   cetirizine 10 MG tablet Commonly known as:  ZYRTEC Take by mouth.   cyclobenzaprine 5 MG tablet Commonly known as:  FLEXERIL TAKE 1-2 TAB(S) 1-3 TIMES A DAY AS NEEDED   diclofenac sodium 1 % Gel Commonly known as:  VOLTAREN Apply topically.   EPINEPHrine 0.3 mg/0.3 mL Soaj injection Commonly known as:  EPI-PEN Use as directed for severe allergic reaction.   esomeprazole 40 MG capsule Commonly known as:  NEXIUM TAKE 1 CAPSULE TWICE DAILY 30 MINUTES PRIOR TO BREAKFAST AND DINNER.   ezetimibe 10 MG tablet Commonly known as:  ZETIA 1 TAB(S) ONCE A DAY ORALLY 90 DAYS   fluticasone 44 MCG/ACT inhaler Commonly known as:  FLOVENT HFA INHALE 2 PUFFS EVERY DAY   furosemide 40 MG tablet Commonly known as:  LASIX TAKE 1 TABLET BY MOUTH EVERY DAY MAY TAKE EXTRA TAB AS NEEDED FOR WEIGHT GAIN/SWELLING   HYDROcodone-acetaminophen 7.5-325 MG tablet Commonly known as:  NORCO Take by mouth.   ibuprofen  800 MG tablet Commonly known as:  ADVIL,MOTRIN   insulin aspart protamine - aspart (70-30) 100 UNIT/ML FlexPen Commonly known as:  NOVOLOG 70/30 MIX Inject into the skin.   isosorbide mononitrate 60 MG 24 hr tablet Commonly known as:  IMDUR Take by mouth.   losartan 100 MG tablet Commonly known as:  COZAAR   nitroGLYCERIN 0.4 MG SL tablet Commonly known as:  NITROSTAT Place under the tongue.   Olopatadine HCl 0.2 % Soln Commonly known as:  PATADAY Place 1 drop into both eyes 1 day or 1 dose.   sitaGLIPtin-metformin 50-500 MG tablet Commonly known as:  JANUMET Take by mouth.   ticagrelor 90 MG Tabs tablet Commonly known as:  BRILINTA TAKE  1 TABLET (90 MG TOTAL) BY MOUTH 2 (TWO) TIMES DAILY.   tiZANidine 2 MG tablet Commonly known as:  ZANAFLEX Take by mouth.   trimethoprim 100 MG tablet Commonly known as:  TRIMPEX Take 100 mg by mouth.   Vitamin D3 1000 units Caps Take by mouth.       Allergies  Allergen Reactions  . Ciprofloxacin Hives and Swelling  . Lisinopril Hives and Itching    Face swelling  Face swelling  Face swelling    Review of systems: Review of systems negative except as noted in HPI / PMHx or noted below: Constitutional: Negative.  HENT: Negative.   Eyes: Negative.  Respiratory: Negative.   Cardiovascular: Negative.  Gastrointestinal: Negative.  Genitourinary: Negative.  Musculoskeletal: Negative.  Neurological: Negative.  Endo/Heme/Allergies: Negative.  Cutaneous: Negative.  Past Medical History:  Diagnosis Date  . Arthritis   . Asthma   . CAD S/P percutaneous coronary angioplasty 06/2012   2 vessel disease old sub totaled occ. of RCA and subtotal occ. of LAD  . Diabetes mellitus without complication (HCC)   . GERD (gastroesophageal reflux disease)   . H/O hiatal hernia   . Hypertension   . Morbid obesity with BMI of 45.0-49.9, adult (HCC)   . NSTEMI (non-ST elevated myocardial infarction) (HCC) 06/2012; Jan 2017  . OSA on CPAP   . Presence of drug coated stent in LAD coronary artery 06/2102; Jan 2017   a. for chronic subtotal LAD occlusion placed 06/2012; b. NSTEMI - PCI LAD-D1 bifurcation [facility])  . Stroke (HCC)   . Tobacco use disorder 07/07/2012    Family History  Problem Relation Age of Onset  . Diabetes Mother   . Diabetes Father   . Heart attack Maternal Grandmother   . Diabetes Maternal Grandmother     Social History   Social History  . Marital status: Married    Spouse name: N/A  . Number of children: N/A  . Years of education: N/A   Occupational History  . Not on file.   Social History Main Topics  . Smoking status: Former Smoker    Quit date:  07/04/2012  . Smokeless tobacco: Never Used  . Alcohol use Yes     Comment: occasional  . Drug use: No  . Sexual activity: Yes   Other Topics Concern  . Not on file   Social History Narrative  . No narrative on file    I appreciate the opportunity to take part in Torsha's care. Please do not hesitate to contact me with questions.  Sincerely,   R. Jorene Guest, MD

## 2016-11-12 NOTE — Assessment & Plan Note (Signed)
   Treatment plan as outlined above for allergic rhinitis.  Continue Pazeo, one drop per eye daily as needed.  I have also recommended eye lubricant drops (i.e., Natural Tears) as needed. 

## 2016-11-12 NOTE — Assessment & Plan Note (Signed)
Skin tests to select food allergens were negative today. The negative predictive value of food allergen skin testing is excellent (approximately 95%). While this does not appear to be an IgE mediated issue, skin testing does not rule out food intolerances. These etiologies are suggested when elimination of the responsible food leads to symptom resolution and re-introduction of the food is followed by the return of symptoms.  The case of rash with tomatoes and [place] food may represent cutaneous response to acidic foods and MSG, respectively.  The patient has been encouraged to keep a careful symptom/food journal and eliminate any food suspected of correlating with symptoms.   An information sheet regarding salicylate-containing foods has been provided.  She is to monitor symptom in context of salicylate intake.  Should symptoms concerning for anaphylaxis occur, epinephrine is to be administered and 911 called immediately.

## 2016-11-12 NOTE — Assessment & Plan Note (Signed)
   Aeroallergen avoidance measures have been discussed and provided in written form.  A prescription has been provided for levocetirizine, 5mg  daily as needed.  Azelastine nasal spray, 2 sprays per nostril 1-2 times daily as needed.  I have also recommended nasal saline spray (i.e., Simply Saline) or nasal saline lavage (i.e., NeilMed) as needed and prior to medicated nasal sprays.  The risks and benefits of aeroallergen immunotherapy have been discussed. The additional risk of immunotherapy while on a beta-blocker has been discussed as well. The patient is motivated to initiate immunotherapy to reduce symptoms and decrease medication requirement. Informed consent has been signed and allergen vaccine orders will be submitted. Medications will be decreased or discontinued as symptom relief from immunotherapy becomes evident.

## 2016-11-12 NOTE — Patient Instructions (Addendum)
Allergic rhinitis  Aeroallergen avoidance measures have been discussed and provided in written form.  A prescription has been provided for levocetirizine, 5mg  daily as needed.  Azelastine nasal spray, 2 sprays per nostril 1-2 times daily as needed.  I have also recommended nasal saline spray (i.e., Simply Saline) or nasal saline lavage (i.e., NeilMed) as needed and prior to medicated nasal sprays.  The risks and benefits of aeroallergen immunotherapy have been discussed. The additional risk of immunotherapy while on a beta-blocker has been discussed as well. The patient is motivated to initiate immunotherapy to reduce symptoms and decrease medication requirement. Informed consent has been signed and allergen vaccine orders will be submitted. Medications will be decreased or discontinued as symptom relief from immunotherapy becomes evident.  Allergic conjunctivitis  Treatment plan as outlined above for allergic rhinitis.  Continue Pazeo, one drop per eye daily as needed.  I have also recommended eye lubricant drops (i.e., Natural Tears) as needed.  Mild persistent asthma Well-controlled.  Continue Pulmicort Flexhaler 90 g, 2 inhalations twice a day, and albuterol HFA, 1-2 inhalations every 4-6 hours as needed.  Subjective and objective measures of pulmonary function will be followed and the treatment plan will be adjusted accordingly.  Epistaxis  Nasal saline spray and/or nasal saline gel is recommended to moisturize nasal mucosa.    Maintain elevated humidifier setting on CPAP.  Oxymetazoline may be used to help stanch blood flow if needed.  If this problem persists or progresses, otolaryngology evaluation may be warranted to search for/cauterize the culprit vessel.  History of food allergy Skin tests to select food allergens were negative today. The negative predictive value of food allergen skin testing is excellent (approximately 95%). While this does not appear to be an IgE  mediated issue, skin testing does not rule out food intolerances. These etiologies are suggested when elimination of the responsible food leads to symptom resolution and re-introduction of the food is followed by the return of symptoms.  The case of rash with tomatoes and [place] food may represent cutaneous response to acidic foods and MSG, respectively.  The patient has been encouraged to keep a careful symptom/food journal and eliminate any food suspected of correlating with symptoms.   An information sheet regarding salicylate-containing foods has been provided.  She is to monitor symptom in context of salicylate intake.  Should symptoms concerning for anaphylaxis occur, epinephrine is to be administered and 911 called immediately.   Return for immunotherapy injections. Return for an office visit in 5 months or sooner if needed.   Reducing Pollen Exposure  The American Academy of Allergy, Asthma and Immunology suggests the following steps to reduce your exposure to pollen during allergy seasons.    1. Do not hang sheets or clothing out to dry; pollen may collect on these items. 2. Do not mow lawns or spend time around freshly cut grass; mowing stirs up pollen. 3. Keep windows closed at night.  Keep car windows closed while driving. 4. Minimize morning activities outdoors, a time when pollen counts are usually at their highest. 5. Stay indoors as much as possible when pollen counts or humidity is high and on windy days when pollen tends to remain in the air longer. 6. Use air conditioning when possible.  Many air conditioners have filters that trap the pollen spores. 7. Use a HEPA room air filter to remove pollen form the indoor air you breathe.   Control of House Dust Mite Allergen  House dust mites play a major role in  allergic asthma and rhinitis.  They occur in environments with high humidity wherever human skin, the food for dust mites is found. High levels have been detected in  dust obtained from mattresses, pillows, carpets, upholstered furniture, bed covers, clothes and soft toys.  The principal allergen of the house dust mite is found in its feces.  A gram of dust may contain 1,000 mites and 250,000 fecal particles.  Mite antigen is easily measured in the air during house cleaning activities.    1. Encase mattresses, including the box spring, and pillow, in an air tight cover.  Seal the zipper end of the encased mattresses with wide adhesive tape. 2. Wash the bedding in water of 130 degrees Farenheit weekly.  Avoid cotton comforters/quilts and flannel bedding: the most ideal bed covering is the dacron comforter. 3. Remove all upholstered furniture from the bedroom. 4. Remove carpets, carpet padding, rugs, and non-washable window drapes from the bedroom.  Wash drapes weekly or use plastic window coverings. 5. Remove all non-washable stuffed toys from the bedroom.  Wash stuffed toys weekly. 6. Have the room cleaned frequently with a vacuum cleaner and a damp dust-mop.  The patient should not be in a room which is being cleaned and should wait 1 hour after cleaning before going into the room. 7. Close and seal all heating outlets in the bedroom.  Otherwise, the room will become filled with dust-laden air.  An electric heater can be used to heat the room. Reduce indoor humidity to less than 50%.  Do not use a humidifier.  Control of Dog or Cat Allergen  Avoidance is the best way to manage a dog or cat allergy. If you have a dog or cat and are allergic to dog or cats, consider removing the dog or cat from the home. If you have a dog or cat but don't want to find it a new home, or if your family wants a pet even though someone in the household is allergic, here are some strategies that may help keep symptoms at bay:  1. Keep the pet out of your bedroom and restrict it to only a few rooms. Be advised that keeping the dog or cat in only one room will not limit the allergens to  that room. 2. Don't pet, hug or kiss the dog or cat; if you do, wash your hands with soap and water. 3. High-efficiency particulate air (HEPA) cleaners run continuously in a bedroom or living room can reduce allergen levels over time. 4. Regular use of a high-efficiency vacuum cleaner or a central vacuum can reduce allergen levels. 5. Giving your dog or cat a bath at least once a week can reduce airborne allergen.  Control of Mold Allergen  Mold and fungi can grow on a variety of surfaces provided certain temperature and moisture conditions exist.  Outdoor molds grow on plants, decaying vegetation and soil.  The major outdoor mold, Alternaria and Cladosporium, are found in very high numbers during hot and dry conditions.  Generally, a late Summer - Fall peak is seen for common outdoor fungal spores.  Rain will temporarily lower outdoor mold spore count, but counts rise rapidly when the rainy period ends.  The most important indoor molds are Aspergillus and Penicillium.  Dark, humid and poorly ventilated basements are ideal sites for mold growth.  The next most common sites of mold growth are the bathroom and the kitchen.  Outdoor [employer] 2. Use air conditioning and keep windows closed 3. Avoid  exposure to decaying vegetation. 4. Avoid leaf raking. 5. Avoid grain handling. 6. Consider wearing a face mask if working in moldy areas.  Indoor Mold Control 1. Maintain humidity below 50%. 2. Clean washable surfaces with 5% bleach solution. 3. Remove sources e.g. Contaminated carpets.  Control of Cockroach Allergen  Cockroach allergen has been identified as an important cause of acute attacks of asthma, especially in urban settings.  There are fifty-five species of cockroach that exist in the [place], however only three, the [place], [place] species produce allergen that can affect patients with Asthma.  Allergens can be obtained from fecal particles, egg casings and  secretions from cockroaches.    1. Remove food sources. 2. Reduce access to water. 3. Seal access and entry points. 4. Spray runways with 0.5-1% Diazinon or Chlorpyrifos 5. Blow boric acid power under stoves and refrigerator. 6. Place bait stations (hydramethylnon) at feeding sites.

## 2016-11-17 DIAGNOSIS — Z7189 Other specified counseling: Secondary | ICD-10-CM | POA: Diagnosis not present

## 2016-11-17 DIAGNOSIS — E119 Type 2 diabetes mellitus without complications: Secondary | ICD-10-CM | POA: Diagnosis not present

## 2016-11-17 DIAGNOSIS — Z01818 Encounter for other preprocedural examination: Secondary | ICD-10-CM | POA: Diagnosis not present

## 2016-11-17 DIAGNOSIS — G4733 Obstructive sleep apnea (adult) (pediatric): Secondary | ICD-10-CM | POA: Diagnosis not present

## 2016-11-17 DIAGNOSIS — E785 Hyperlipidemia, unspecified: Secondary | ICD-10-CM | POA: Diagnosis not present

## 2016-11-17 DIAGNOSIS — I10 Essential (primary) hypertension: Secondary | ICD-10-CM | POA: Diagnosis not present

## 2016-11-17 DIAGNOSIS — J45909 Unspecified asthma, uncomplicated: Secondary | ICD-10-CM | POA: Diagnosis not present

## 2016-11-17 DIAGNOSIS — K219 Gastro-esophageal reflux disease without esophagitis: Secondary | ICD-10-CM | POA: Diagnosis not present

## 2016-11-17 DIAGNOSIS — Z9884 Bariatric surgery status: Secondary | ICD-10-CM | POA: Diagnosis not present

## 2016-11-18 DIAGNOSIS — G4733 Obstructive sleep apnea (adult) (pediatric): Secondary | ICD-10-CM | POA: Diagnosis not present

## 2016-11-20 DIAGNOSIS — G4733 Obstructive sleep apnea (adult) (pediatric): Secondary | ICD-10-CM | POA: Diagnosis not present

## 2016-11-24 DIAGNOSIS — I2102 ST elevation (STEMI) myocardial infarction involving left anterior descending coronary artery: Secondary | ICD-10-CM | POA: Diagnosis not present

## 2016-11-26 DIAGNOSIS — M5441 Lumbago with sciatica, right side: Secondary | ICD-10-CM | POA: Diagnosis not present

## 2016-11-26 DIAGNOSIS — Z79899 Other long term (current) drug therapy: Secondary | ICD-10-CM | POA: Diagnosis not present

## 2016-11-26 DIAGNOSIS — M5442 Lumbago with sciatica, left side: Secondary | ICD-10-CM | POA: Diagnosis not present

## 2016-11-30 DIAGNOSIS — I2102 ST elevation (STEMI) myocardial infarction involving left anterior descending coronary artery: Secondary | ICD-10-CM | POA: Diagnosis not present

## 2016-12-02 DIAGNOSIS — I2102 ST elevation (STEMI) myocardial infarction involving left anterior descending coronary artery: Secondary | ICD-10-CM | POA: Diagnosis not present

## 2016-12-03 DIAGNOSIS — I2102 ST elevation (STEMI) myocardial infarction involving left anterior descending coronary artery: Secondary | ICD-10-CM | POA: Diagnosis not present

## 2016-12-07 DIAGNOSIS — J45909 Unspecified asthma, uncomplicated: Secondary | ICD-10-CM | POA: Diagnosis not present

## 2016-12-07 DIAGNOSIS — E1165 Type 2 diabetes mellitus with hyperglycemia: Secondary | ICD-10-CM | POA: Diagnosis not present

## 2016-12-07 DIAGNOSIS — I2102 ST elevation (STEMI) myocardial infarction involving left anterior descending coronary artery: Secondary | ICD-10-CM | POA: Diagnosis not present

## 2016-12-07 DIAGNOSIS — M1710 Unilateral primary osteoarthritis, unspecified knee: Secondary | ICD-10-CM | POA: Diagnosis not present

## 2016-12-07 DIAGNOSIS — I10 Essential (primary) hypertension: Secondary | ICD-10-CM | POA: Diagnosis not present

## 2016-12-09 DIAGNOSIS — I2102 ST elevation (STEMI) myocardial infarction involving left anterior descending coronary artery: Secondary | ICD-10-CM | POA: Diagnosis not present

## 2016-12-10 DIAGNOSIS — I2102 ST elevation (STEMI) myocardial infarction involving left anterior descending coronary artery: Secondary | ICD-10-CM | POA: Diagnosis not present

## 2016-12-11 DIAGNOSIS — K219 Gastro-esophageal reflux disease without esophagitis: Secondary | ICD-10-CM | POA: Diagnosis not present

## 2016-12-11 DIAGNOSIS — R112 Nausea with vomiting, unspecified: Secondary | ICD-10-CM | POA: Diagnosis not present

## 2016-12-14 DIAGNOSIS — I2102 ST elevation (STEMI) myocardial infarction involving left anterior descending coronary artery: Secondary | ICD-10-CM | POA: Diagnosis not present

## 2016-12-17 DIAGNOSIS — I2102 ST elevation (STEMI) myocardial infarction involving left anterior descending coronary artery: Secondary | ICD-10-CM | POA: Diagnosis not present

## 2016-12-19 DIAGNOSIS — G4733 Obstructive sleep apnea (adult) (pediatric): Secondary | ICD-10-CM | POA: Diagnosis not present

## 2016-12-21 DIAGNOSIS — I2102 ST elevation (STEMI) myocardial infarction involving left anterior descending coronary artery: Secondary | ICD-10-CM | POA: Diagnosis not present

## 2016-12-23 DIAGNOSIS — I2102 ST elevation (STEMI) myocardial infarction involving left anterior descending coronary artery: Secondary | ICD-10-CM | POA: Diagnosis not present

## 2016-12-23 DIAGNOSIS — G4733 Obstructive sleep apnea (adult) (pediatric): Secondary | ICD-10-CM | POA: Diagnosis not present

## 2016-12-24 DIAGNOSIS — I2102 ST elevation (STEMI) myocardial infarction involving left anterior descending coronary artery: Secondary | ICD-10-CM | POA: Diagnosis not present

## 2016-12-24 DIAGNOSIS — Z5189 Encounter for other specified aftercare: Secondary | ICD-10-CM | POA: Diagnosis not present

## 2016-12-25 DIAGNOSIS — I2510 Atherosclerotic heart disease of native coronary artery without angina pectoris: Secondary | ICD-10-CM | POA: Diagnosis not present

## 2016-12-25 DIAGNOSIS — I252 Old myocardial infarction: Secondary | ICD-10-CM | POA: Diagnosis not present

## 2016-12-25 DIAGNOSIS — R079 Chest pain, unspecified: Secondary | ICD-10-CM | POA: Diagnosis not present

## 2016-12-25 DIAGNOSIS — I10 Essential (primary) hypertension: Secondary | ICD-10-CM | POA: Diagnosis not present

## 2016-12-25 DIAGNOSIS — E785 Hyperlipidemia, unspecified: Secondary | ICD-10-CM | POA: Diagnosis not present

## 2016-12-29 DIAGNOSIS — I10 Essential (primary) hypertension: Secondary | ICD-10-CM | POA: Diagnosis not present

## 2016-12-29 DIAGNOSIS — M1710 Unilateral primary osteoarthritis, unspecified knee: Secondary | ICD-10-CM | POA: Diagnosis not present

## 2016-12-29 DIAGNOSIS — E1165 Type 2 diabetes mellitus with hyperglycemia: Secondary | ICD-10-CM | POA: Diagnosis not present

## 2016-12-29 DIAGNOSIS — Z0000 Encounter for general adult medical examination without abnormal findings: Secondary | ICD-10-CM | POA: Diagnosis not present

## 2016-12-29 DIAGNOSIS — J45909 Unspecified asthma, uncomplicated: Secondary | ICD-10-CM | POA: Diagnosis not present

## 2016-12-30 DIAGNOSIS — I2102 ST elevation (STEMI) myocardial infarction involving left anterior descending coronary artery: Secondary | ICD-10-CM | POA: Diagnosis not present

## 2016-12-31 DIAGNOSIS — I2102 ST elevation (STEMI) myocardial infarction involving left anterior descending coronary artery: Secondary | ICD-10-CM | POA: Diagnosis not present

## 2017-01-04 DIAGNOSIS — I2102 ST elevation (STEMI) myocardial infarction involving left anterior descending coronary artery: Secondary | ICD-10-CM | POA: Diagnosis not present

## 2017-01-06 DIAGNOSIS — I2102 ST elevation (STEMI) myocardial infarction involving left anterior descending coronary artery: Secondary | ICD-10-CM | POA: Diagnosis not present

## 2017-01-07 DIAGNOSIS — I10 Essential (primary) hypertension: Secondary | ICD-10-CM | POA: Diagnosis not present

## 2017-01-07 DIAGNOSIS — E119 Type 2 diabetes mellitus without complications: Secondary | ICD-10-CM | POA: Diagnosis not present

## 2017-01-07 DIAGNOSIS — I2102 ST elevation (STEMI) myocardial infarction involving left anterior descending coronary artery: Secondary | ICD-10-CM | POA: Diagnosis not present

## 2017-01-07 DIAGNOSIS — D649 Anemia, unspecified: Secondary | ICD-10-CM | POA: Diagnosis not present

## 2017-01-08 DIAGNOSIS — Z79899 Other long term (current) drug therapy: Secondary | ICD-10-CM | POA: Diagnosis not present

## 2017-01-08 DIAGNOSIS — M5416 Radiculopathy, lumbar region: Secondary | ICD-10-CM | POA: Diagnosis not present

## 2017-01-11 DIAGNOSIS — I2102 ST elevation (STEMI) myocardial infarction involving left anterior descending coronary artery: Secondary | ICD-10-CM | POA: Diagnosis not present

## 2017-01-13 DIAGNOSIS — I2102 ST elevation (STEMI) myocardial infarction involving left anterior descending coronary artery: Secondary | ICD-10-CM | POA: Diagnosis not present

## 2017-01-14 DIAGNOSIS — I2102 ST elevation (STEMI) myocardial infarction involving left anterior descending coronary artery: Secondary | ICD-10-CM | POA: Diagnosis not present

## 2017-01-15 ENCOUNTER — Other Ambulatory Visit: Payer: Self-pay

## 2017-01-15 DIAGNOSIS — M5136 Other intervertebral disc degeneration, lumbar region: Secondary | ICD-10-CM | POA: Diagnosis not present

## 2017-01-15 DIAGNOSIS — I2510 Atherosclerotic heart disease of native coronary artery without angina pectoris: Secondary | ICD-10-CM

## 2017-01-15 DIAGNOSIS — M4807 Spinal stenosis, lumbosacral region: Secondary | ICD-10-CM | POA: Diagnosis not present

## 2017-01-18 ENCOUNTER — Other Ambulatory Visit (HOSPITAL_BASED_OUTPATIENT_CLINIC_OR_DEPARTMENT_OTHER): Payer: Medicare Other

## 2017-01-18 ENCOUNTER — Ambulatory Visit (HOSPITAL_BASED_OUTPATIENT_CLINIC_OR_DEPARTMENT_OTHER): Payer: Medicare Other | Admitting: Family

## 2017-01-18 ENCOUNTER — Other Ambulatory Visit: Payer: Self-pay | Admitting: Family

## 2017-01-18 VITALS — BP 108/67 | HR 82 | Temp 98.9°F | Resp 18 | Wt 306.0 lb

## 2017-01-18 DIAGNOSIS — D500 Iron deficiency anemia secondary to blood loss (chronic): Secondary | ICD-10-CM

## 2017-01-18 DIAGNOSIS — I252 Old myocardial infarction: Secondary | ICD-10-CM

## 2017-01-18 DIAGNOSIS — Z87891 Personal history of nicotine dependence: Secondary | ICD-10-CM

## 2017-01-18 DIAGNOSIS — D510 Vitamin B12 deficiency anemia due to intrinsic factor deficiency: Secondary | ICD-10-CM

## 2017-01-18 DIAGNOSIS — D509 Iron deficiency anemia, unspecified: Secondary | ICD-10-CM

## 2017-01-18 DIAGNOSIS — Z8673 Personal history of transient ischemic attack (TIA), and cerebral infarction without residual deficits: Secondary | ICD-10-CM

## 2017-01-18 DIAGNOSIS — I2510 Atherosclerotic heart disease of native coronary artery without angina pectoris: Secondary | ICD-10-CM | POA: Diagnosis not present

## 2017-01-18 DIAGNOSIS — G8929 Other chronic pain: Secondary | ICD-10-CM | POA: Diagnosis not present

## 2017-01-18 DIAGNOSIS — D649 Anemia, unspecified: Secondary | ICD-10-CM | POA: Diagnosis not present

## 2017-01-18 DIAGNOSIS — M545 Low back pain: Secondary | ICD-10-CM | POA: Diagnosis not present

## 2017-01-18 DIAGNOSIS — I639 Cerebral infarction, unspecified: Secondary | ICD-10-CM

## 2017-01-18 DIAGNOSIS — Z832 Family history of diseases of the blood and blood-forming organs and certain disorders involving the immune mechanism: Secondary | ICD-10-CM | POA: Diagnosis not present

## 2017-01-18 LAB — CBC WITH DIFFERENTIAL (CANCER CENTER ONLY)
BASO#: 0 10*3/uL (ref 0.0–0.2)
BASO%: 0.2 % (ref 0.0–2.0)
EOS ABS: 0.2 10*3/uL (ref 0.0–0.5)
EOS%: 4.4 % (ref 0.0–7.0)
HEMATOCRIT: 28.7 % — AB (ref 34.8–46.6)
HGB: 9.1 g/dL — ABNORMAL LOW (ref 11.6–15.9)
LYMPH#: 1.5 10*3/uL (ref 0.9–3.3)
LYMPH%: 30.8 % (ref 14.0–48.0)
MCH: 28.6 pg (ref 26.0–34.0)
MCHC: 31.7 g/dL — AB (ref 32.0–36.0)
MCV: 90 fL (ref 81–101)
MONO#: 0.5 10*3/uL (ref 0.1–0.9)
MONO%: 9.9 % (ref 0.0–13.0)
NEUT#: 2.7 10*3/uL (ref 1.5–6.5)
NEUT%: 54.7 % (ref 39.6–80.0)
PLATELETS: 291 10*3/uL (ref 145–400)
RBC: 3.18 10*6/uL — ABNORMAL LOW (ref 3.70–5.32)
RDW: 14.3 % (ref 11.1–15.7)
WBC: 5 10*3/uL (ref 3.9–10.0)

## 2017-01-18 LAB — CMP (CANCER CENTER ONLY)
ALK PHOS: 100 U/L — AB (ref 26–84)
ALT(SGPT): 19 U/L (ref 10–47)
AST: 22 U/L (ref 11–38)
Albumin: 3.1 g/dL — ABNORMAL LOW (ref 3.3–5.5)
BUN: 21 mg/dL (ref 7–22)
CALCIUM: 9.2 mg/dL (ref 8.0–10.3)
CHLORIDE: 107 meq/L (ref 98–108)
CO2: 27 meq/L (ref 18–33)
Creat: 1.4 mg/dl — ABNORMAL HIGH (ref 0.6–1.2)
GLUCOSE: 118 mg/dL (ref 73–118)
POTASSIUM: 4.8 meq/L — AB (ref 3.3–4.7)
Sodium: 146 mEq/L — ABNORMAL HIGH (ref 128–145)
Total Bilirubin: 0.6 mg/dl (ref 0.20–1.60)
Total Protein: 7.3 g/dL (ref 6.4–8.1)

## 2017-01-18 LAB — RETICULOCYTES: Reticulocyte Count: 1 % (ref 0.6–2.6)

## 2017-01-18 LAB — CHCC SATELLITE - SMEAR

## 2017-01-18 LAB — MAGNESIUM: MAGNESIUM: 2.2 mg/dL (ref 1.5–2.5)

## 2017-01-18 NOTE — Progress Notes (Signed)
Hematology/Oncology Consultation   Name: Kimberly Ramos      MRN: [id number]    Location: Room/bed info not found  Date: 01/18/2017 Time:1:48 PM   REFERRING PHYSICIAN: Jackie Plum, MD  REASON FOR CONSULT: Anemia   DIAGNOSIS: Anemia   HISTORY OF PRESENT ILLNESS: Kimberly Ramos is a very pleasant 46 yo African American female with anemia. She is symptomatic with fatigue, weakness, SOB with over exertion, chills, chewing ice and occasional palpitations without chest pain.  She has had no episodes of bleeding, bruising or petechiae. She has an IUD and does not have a cycle.  She states that she recently saw a nephrologist and her kidneys are "ok".  She states that her brother mother also have history of anemia.  No sickle cell trait or disease that she knows of. No personal or familial cancer history.  She has quite an impressive cardiac history to be so young. She states that she had an ischemic stroke in 2005. She then had MI's in 2014, 2017 and 2018. She states that she has multiple stents and that she is on Aspirin and Brilinta. She state that her MI earlier this year occurred when she developed a clot on her stent. She has no history of bleeding with surgery.  She has history of one miscarriage and 1 living child.  No fever, chills, n/v, cough, rash, dizziness, abdominal pain or changes in bowel or bladder habits.  She has chronic puffiness in her feet and ankles and takes lasix daily as prescribed which helps alleviate this. She has chronic back issues that have caused sciatica with numbness and tingling in the right leg and foot. She is disabled due to her back problems. She previously worked as an Scientific laboratory technician.  She is a former smoker but quit several years ago. She does not drink alcohol.   ROS: All other 10 point review of systems is negative.   PAST MEDICAL HISTORY:   Past Medical History:  Diagnosis Date  . Arthritis   . Asthma   . CAD S/P percutaneous coronary angioplasty 06/2012    2 vessel disease old sub totaled occ. of RCA and subtotal occ. of LAD  . Diabetes mellitus without complication (HCC)   . GERD (gastroesophageal reflux disease)   . H/O hiatal hernia   . Hypertension   . Morbid obesity with BMI of 45.0-49.9, adult (HCC)   . NSTEMI (non-ST elevated myocardial infarction) (HCC) 06/2012; Jan 2017  . OSA on CPAP   . Presence of drug coated stent in LAD coronary artery 06/2102; Jan 2017   a. for chronic subtotal LAD occlusion placed 06/2012; b. NSTEMI - PCI LAD-D1 bifurcation [facility])  . Stroke (HCC)   . Tobacco use disorder 07/07/2012    ALLERGIES: Allergies  Allergen Reactions  . Ciprofloxacin Hives and Swelling  . Lisinopril Hives and Itching    Face swelling  Face swelling  Face swelling       MEDICATIONS:  Current Outpatient Prescriptions on File Prior to Visit  Medication Sig Dispense Refill  . albuterol (PROVENTIL HFA;VENTOLIN HFA) 108 (90 Base) MCG/ACT inhaler Inhale into the lungs.    Marland Kitchen aspirin EC 81 MG tablet Take 81 mg by mouth daily.    Marland Kitchen atorvastatin (LIPITOR) 80 MG tablet Take 80 mg by mouth at bedtime.  1  . Azelastine HCl 0.15 % SOLN Place 1 spray into the nose 2 (two) times daily as needed. 30 mL 3  . BD INSULIN SYRINGE ULTRAFINE 31G  X 5/16" 0.3 ML MISC     . Budesonide 90 MCG/ACT inhaler Inhale into the lungs.    . carvedilol (COREG) 25 MG tablet Take 25 mg by mouth.    . carvedilol (COREG) 25 MG tablet Take 25 mg by mouth 2 (two) times daily with a meal.    . cetirizine (ZYRTEC) 10 MG tablet Take by mouth.    . Cholecalciferol (VITAMIN D3) 1000 units CAPS Take by mouth.    . cyclobenzaprine (FLEXERIL) 5 MG tablet TAKE 1-2 TAB(S) 1-3 TIMES A DAY AS NEEDED  0  . diclofenac sodium (VOLTAREN) 1 % GEL Apply topically.    Marland Kitchen EPINEPHrine 0.3 mg/0.3 mL IJ SOAJ injection Use as directed for severe allergic reaction. 2 Device 1  . esomeprazole (NEXIUM) 40 MG capsule TAKE 1 CAPSULE TWICE DAILY 30 MINUTES PRIOR TO BREAKFAST AND DINNER.     Marland Kitchen ezetimibe (ZETIA) 10 MG tablet 1 TAB(S) ONCE A DAY ORALLY 90 DAYS  1  . fluticasone (FLOVENT HFA) 44 MCG/ACT inhaler INHALE 2 PUFFS EVERY DAY    . furosemide (LASIX) 40 MG tablet TAKE 1 TABLET BY MOUTH EVERY DAY MAY TAKE EXTRA TAB AS NEEDED FOR WEIGHT GAIN/SWELLING    . HYDROcodone-acetaminophen (NORCO) 7.5-325 MG tablet Take by mouth.    Marland Kitchen ibuprofen (ADVIL,MOTRIN) 800 MG tablet     . insulin aspart protamine - aspart (NOVOLOG 70/30 MIX) (70-30) 100 UNIT/ML FlexPen Inject into the skin.    Marland Kitchen isosorbide mononitrate (IMDUR) 60 MG 24 hr tablet Take by mouth.    . losartan (COZAAR) 100 MG tablet     . nitroGLYCERIN (NITROSTAT) 0.4 MG SL tablet Place under the tongue.    Marland Kitchen Olopatadine HCl (PATADAY) 0.2 % SOLN Place 1 drop into both eyes 1 day or 1 dose. 1 Bottle 5  . sitaGLIPtin-metformin (JANUMET) 50-500 MG tablet Take by mouth.    . ticagrelor (BRILINTA) 90 MG TABS tablet TAKE 1 TABLET (90 MG TOTAL) BY MOUTH 2 (TWO) TIMES DAILY.    Marland Kitchen tiZANidine (ZANAFLEX) 2 MG tablet Take by mouth.    . trimethoprim (TRIMPEX) 100 MG tablet Take 100 mg by mouth.     Current Facility-Administered Medications on File Prior to Visit  Medication Dose Route Frequency Provider Last Rate Last Dose  . predniSONE (DELTASONE) tablet 10 mg  10 mg Oral Q breakfast Bobbitt, Heywood Iles, MD         PAST SURGICAL HISTORY Past Surgical History:  Procedure Laterality Date  . CARDIAC CATHETERIZATION  07/05/2012   07/05/12  + CAD  . CARDIAC CATHETERIZATION  2010   RCA stenosis with collaterals  . CARDIAC CATHETERIZATION  Apr 30, 2015   [facility] - Severe LAD stenosis @ prox edge of stent --> PCI  . carpel tunnel    . LEFT HEART CATHETERIZATION WITH CORONARY ANGIOGRAM N/A 07/05/2012   Procedure: LEFT HEART CATHETERIZATION WITH CORONARY ANGIOGRAM;  Surgeon: Lennette Bihari, MD;  Location: [facility] [place] CATH LAB;  Service: Cardiovascular;  LAD Subtototalled after D1 (not noticed in 2010, but present), RCA CTO.  . MYOCARDIAL PERFUSION  IMAGING  07/15/2012   EF 57% lge area ant and septal ischemia  . PERCUTANEOUS CORONARY STENT INTERVENTION (PCI-S) N/A 07/20/2012   Procedure: PERCUTANEOUS CORONARY STENT INTERVENTION (PCI-S);  Surgeon: Marykay Lex, MD;  Location: [facility] CATH LAB;  PCI of LAD Promus Premier 2.25 mm x 20 mm ( 2.65 mm prox)  . PERCUTANEOUS CORONARY STENT INTERVENTION (PCI-S)  May 01, 2015   [facility] -  PCI of LAD (prox of prior stent), & ? D1. (DES)     FAMILY HISTORY: Family History  Problem Relation Age of Onset  . Diabetes Mother   . Diabetes Father   . Heart attack Maternal Grandmother   . Diabetes Maternal Grandmother     SOCIAL HISTORY:  reports that she quit smoking about 4 years ago. She has never used smokeless tobacco. She reports that she drinks alcohol. She reports that she does not use drugs.  PERFORMANCE STATUS: The patient's performance status is 1 - Symptomatic but completely ambulatory  PHYSICAL EXAM: Most Recent Vital Signs: Blood pressure 108/67, pulse 82, temperature 98.9 F (37.2 C), temperature source Oral, resp. rate 18, weight (!) 306 lb (138.8 kg), SpO2 100 %. BP 108/67 (BP Location: Left Arm, Patient Position: Sitting)   Pulse 82   Temp 98.9 F (37.2 C) (Oral)   Resp 18   Wt (!) 306 lb (138.8 kg)   SpO2 100%   BMI 50.92 kg/m   General Appearance:    Alert, cooperative, no distress, appears stated age  Head:    Normocephalic, without obvious abnormality, atraumatic  Eyes:    PERRL, conjunctiva/corneas clear, EOM's intact, fundi    benign, both eyes        Throat:   Lips, mucosa, and tongue normal; teeth and gums normal  Neck:   Supple, symmetrical, trachea midline, no adenopathy;    thyroid:  no enlargement/tenderness/nodules; no carotid   bruit or JVD  Back:     Symmetric, no curvature, ROM normal, no CVA tenderness  Lungs:     Clear to auscultation bilaterally, respirations unlabored  Chest Wall:    No tenderness or deformity   Heart:    Regular rate and rhythm, S1  and S2 normal, no murmur, rub   or gallop     Abdomen:     Soft, non-tender, bowel sounds active all four quadrants,    no masses, no organomegaly         Extremities:   Extremities normal, atraumatic, no cyanosis or edema  Pulses:   2+ and symmetric all extremities  Skin:   Skin color, texture, turgor normal, no rashes or lesions  Lymph nodes:   Cervical, supraclavicular, and axillary nodes normal  Neurologic:   CNII-XII intact, normal strength, sensation and reflexes    throughout    LABORATORY DATA:  Results for orders placed or performed in visit on 01/18/17 (from the past 48 hour(s))  CBC w/Diff     Status: Abnormal   Collection Time: 01/18/17  1:10 PM  Result Value Ref Range   WBC 5.0 3.9 - 10.0 10e3/uL   RBC 3.18 (L) 3.70 - 5.32 10e6/uL   HGB 9.1 (L) 11.6 - 15.9 g/dL   HCT 16.1 (L) 09.6 - 04.5 %   MCV 90 81 - 101 fL   MCH 28.6 26.0 - 34.0 pg   MCHC 31.7 (L) 32.0 - 36.0 g/dL   RDW 40.9 81.1 - 91.4 %   Platelets 291 145 - 400 10e3/uL   NEUT# 2.7 1.5 - 6.5 10e3/uL   LYMPH# 1.5 0.9 - 3.3 10e3/uL   MONO# 0.5 0.1 - 0.9 10e3/uL   Eosinophils Absolute 0.2 0.0 - 0.5 10e3/uL   BASO# 0.0 0.0 - 0.2 10e3/uL   NEUT% 54.7 39.6 - 80.0 %   LYMPH% 30.8 14.0 - 48.0 %   MONO% 9.9 0.0 - 13.0 %   EOS% 4.4 0.0 - 7.0 %   BASO% 0.2 0.0 -  2.0 %  Smear     Status: None   Collection Time: 01/18/17  1:10 PM  Result Value Ref Range   Smear Result Smear Available       RADIOGRAPHY: No results found.     PATHOLOGY: None  ASSESSMENT/PLAN: Ms. Mcgrory is a very pleasant 46 yo African American female with anemia. She is on Brilinta and aspirin for history of multiple MI's and she has also states she has had an ischemic stroke.  Now that we know about her extensive cardiac history we will do a hypercoag work up at her next visit. She has to leave now to pick up her niece and nephew from school.  Hgb is 9.1 with an MCV of 90. Iron saturation was low at 11% with a ferritin of 30.  We will give  her a dose of IV iron later this week or next and see if this helps improved her symptoms (fatigue, weakness, SOB with exertion, chewing ice and occasional palpitations).  We will draw labs prior to her infusion (homocystine, hyper coag panel and lupus anticoagulant).  We will plan to see her back again in another 6 weeks for repeat labs and follow-up.   All questions were answered and she is in agreement with the plan. She will contact our office with any questions or concerns. We can certainly see her sooner if needed.  She was discussed with and also seen by Dr. Myna Hidalgo and he is in agreement with the aforementioned.   [facility]    Addendum:  I saw and examined the patient with Sabina Beavers. I agree with the above assessment.  She clearly has iron deficiency anemia. Her iron studies show a ferritin of 30 with an iron saturation of only 11%.  By her peripheral blood smear, she has some hypochromic and microcytic red blood cells.  I suspect that she probably has a combination of knowledge or vision and bleeding. She is on Brilinta and aspirin.  I find it interesting that a woman so young has had cerebrovascular and cardiovascular issues. I am surprised that a hypercoagulable evaluation has not been undertaken. We will definitely do this. I think it is important for [place] to make sure that there is no thrombophilic state that she has.  She is very grateful for [place] to do this. She has wanted this done before but was afraid to speak to her doctors about this.  We will go ahead with IV iron. We will then plan to see her back in another month or so.  We spent about 40 minutes with her. We answered her questions. We reviewed the lab work with her. Again, she was thankful for [place] to spend so much time with her.  Christin Bach, MD

## 2017-01-19 DIAGNOSIS — G4733 Obstructive sleep apnea (adult) (pediatric): Secondary | ICD-10-CM | POA: Diagnosis not present

## 2017-01-19 DIAGNOSIS — D509 Iron deficiency anemia, unspecified: Secondary | ICD-10-CM | POA: Insufficient documentation

## 2017-01-19 LAB — HEMOGLOBINOPATHY EVALUATION
HGB A: 97.9 % (ref 96.4–98.8)
HGB C: 0 %
HGB S: 0 %
HGB VARIANT: 0 %
Hemoglobin A2 Quantitation: 2.1 % (ref 1.8–3.2)
Hemoglobin F Quantitation: 0 % (ref 0.0–2.0)

## 2017-01-19 LAB — IRON AND TIBC
%SAT: 11 % — AB (ref 21–57)
IRON: 32 ug/dL — AB (ref 41–142)
TIBC: 299 ug/dL (ref 236–444)
UIBC: 267 ug/dL (ref 120–384)

## 2017-01-19 LAB — ERYTHROPOIETIN: Erythropoietin: 31 m[IU]/mL — ABNORMAL HIGH (ref 2.6–18.5)

## 2017-01-19 LAB — FERRITIN: Ferritin: 30 ng/ml (ref 9–269)

## 2017-01-19 LAB — VITAMIN B12: VITAMIN B 12: 618 pg/mL (ref 232–1245)

## 2017-01-25 DIAGNOSIS — I2102 ST elevation (STEMI) myocardial infarction involving left anterior descending coronary artery: Secondary | ICD-10-CM | POA: Diagnosis not present

## 2017-01-27 DIAGNOSIS — I2102 ST elevation (STEMI) myocardial infarction involving left anterior descending coronary artery: Secondary | ICD-10-CM | POA: Diagnosis not present

## 2017-02-02 DIAGNOSIS — I10 Essential (primary) hypertension: Secondary | ICD-10-CM | POA: Diagnosis not present

## 2017-02-02 DIAGNOSIS — E119 Type 2 diabetes mellitus without complications: Secondary | ICD-10-CM | POA: Diagnosis not present

## 2017-02-05 DIAGNOSIS — I340 Nonrheumatic mitral (valve) insufficiency: Secondary | ICD-10-CM | POA: Diagnosis not present

## 2017-02-11 ENCOUNTER — Other Ambulatory Visit: Payer: Self-pay | Admitting: Family

## 2017-02-17 ENCOUNTER — Ambulatory Visit: Payer: Medicare Other

## 2017-02-17 ENCOUNTER — Other Ambulatory Visit: Payer: Medicare Other

## 2017-02-18 DIAGNOSIS — G4733 Obstructive sleep apnea (adult) (pediatric): Secondary | ICD-10-CM | POA: Diagnosis not present

## 2017-02-18 DIAGNOSIS — Z01818 Encounter for other preprocedural examination: Secondary | ICD-10-CM | POA: Diagnosis not present

## 2017-02-18 DIAGNOSIS — J45909 Unspecified asthma, uncomplicated: Secondary | ICD-10-CM | POA: Diagnosis not present

## 2017-02-18 DIAGNOSIS — K219 Gastro-esophageal reflux disease without esophagitis: Secondary | ICD-10-CM | POA: Diagnosis not present

## 2017-02-19 ENCOUNTER — Other Ambulatory Visit: Payer: Self-pay | Admitting: Family

## 2017-02-19 ENCOUNTER — Other Ambulatory Visit: Payer: Medicare Other

## 2017-02-19 ENCOUNTER — Ambulatory Visit (HOSPITAL_BASED_OUTPATIENT_CLINIC_OR_DEPARTMENT_OTHER): Payer: Medicare Other

## 2017-02-19 VITALS — BP 116/65 | HR 79 | Temp 98.1°F | Resp 17

## 2017-02-19 DIAGNOSIS — I639 Cerebral infarction, unspecified: Secondary | ICD-10-CM | POA: Diagnosis not present

## 2017-02-19 DIAGNOSIS — D509 Iron deficiency anemia, unspecified: Secondary | ICD-10-CM | POA: Diagnosis not present

## 2017-02-19 DIAGNOSIS — D500 Iron deficiency anemia secondary to blood loss (chronic): Secondary | ICD-10-CM

## 2017-02-19 DIAGNOSIS — I252 Old myocardial infarction: Secondary | ICD-10-CM | POA: Diagnosis not present

## 2017-02-19 MED ORDER — SODIUM CHLORIDE 0.9 % IV SOLN
Freq: Once | INTRAVENOUS | Status: AC
Start: 2017-02-19 — End: 2017-02-19
  Administered 2017-02-19: 10:00:00 via INTRAVENOUS

## 2017-02-19 MED ORDER — SODIUM CHLORIDE 0.9 % IV SOLN
510.0000 mg | Freq: Once | INTRAVENOUS | Status: AC
  Administered 2017-02-19: 510 mg via INTRAVENOUS
  Filled 2017-02-19: qty 17

## 2017-02-19 NOTE — Patient Instructions (Signed)

## 2017-02-21 LAB — PROTEIN S, TOTAL: PROTEIN S AG TOTAL: 80 % (ref 60–150)

## 2017-02-21 LAB — PROTEIN C ACTIVITY: Protein C-Functional: 94 % (ref 73–180)

## 2017-02-21 LAB — ANTITHROMBIN III: ANTITHROMBIN ACTIVITY: 119 % (ref 75–135)

## 2017-02-21 LAB — PROTEIN S ACTIVITY: PROTEIN S ACTIVITY: 67 % (ref 63–140)

## 2017-02-22 DIAGNOSIS — M6281 Muscle weakness (generalized): Secondary | ICD-10-CM | POA: Diagnosis not present

## 2017-02-22 DIAGNOSIS — M5416 Radiculopathy, lumbar region: Secondary | ICD-10-CM | POA: Diagnosis not present

## 2017-02-22 LAB — LUPUS ANTICOAGULANT PANEL
DRVVT: 29.2 s (ref 0.0–47.0)
PTT-LA: 25.6 s (ref 0.0–51.9)

## 2017-02-22 LAB — HOMOCYSTEINE: Homocysteine: 15.3 umol/L — ABNORMAL HIGH (ref 0.0–15.0)

## 2017-02-23 LAB — BETA-2-GLYCOPROTEIN I ABS, IGG/M/A
Beta-2 Glyco 1 IgA: <9 GPI IgA units (ref 0–25)
Beta-2 Glycoprotein I Ab, IgG: <9 GPI IgG units (ref 0–20)

## 2017-02-23 LAB — PROTEIN C, TOTAL: Protein C Antigen: 84 % (ref 60–150)

## 2017-02-23 LAB — PROTHROMBIN GENE MUTATION

## 2017-02-24 LAB — CARDIOLIPIN ANTIBODIES, IGG, IGM, IGA: Anticardiolipin Ab,IgM,Qn: <9 MPL U/mL (ref 0–12)

## 2017-02-25 LAB — FACTOR 5 LEIDEN

## 2017-02-26 ENCOUNTER — Other Ambulatory Visit: Payer: Self-pay | Admitting: Family

## 2017-02-26 DIAGNOSIS — D500 Iron deficiency anemia secondary to blood loss (chronic): Secondary | ICD-10-CM

## 2017-02-26 DIAGNOSIS — G4733 Obstructive sleep apnea (adult) (pediatric): Secondary | ICD-10-CM | POA: Diagnosis not present

## 2017-02-26 DIAGNOSIS — I214 Non-ST elevation (NSTEMI) myocardial infarction: Secondary | ICD-10-CM

## 2017-02-26 DIAGNOSIS — J4521 Mild intermittent asthma with (acute) exacerbation: Secondary | ICD-10-CM | POA: Diagnosis not present

## 2017-02-26 MED ORDER — FOLIC ACID 1 MG PO TABS: 1.0000 mg | ORAL_TABLET | Freq: Every day | ORAL | 11 refills | Status: DC

## 2017-03-01 ENCOUNTER — Telehealth: Payer: Self-pay

## 2017-03-01 NOTE — Telephone Encounter (Signed)
-----   Message from Verdie Mosher, NP sent at 02/26/2017  2:54 PM EDT ----- Regarding: labs Lab work ok, homocystine level mildly elevated, with cardiac history and iron deficiency anemia we will have her start folic acid, per Dr. Myna Hidalgo. Thank you!!  Maralyn Sago  ----- Message ----- From: Interface, Lab In Three Zero One Sent: 02/21/2017   2:36 PM To: Verdie Mosher, NP

## 2017-03-01 NOTE — Telephone Encounter (Signed)
Sarah's message given to pt via phone. Pt verbalizes understanding. dph

## 2017-03-03 DIAGNOSIS — E119 Type 2 diabetes mellitus without complications: Secondary | ICD-10-CM | POA: Diagnosis not present

## 2017-03-03 DIAGNOSIS — J4521 Mild intermittent asthma with (acute) exacerbation: Secondary | ICD-10-CM | POA: Diagnosis not present

## 2017-03-03 DIAGNOSIS — I10 Essential (primary) hypertension: Secondary | ICD-10-CM | POA: Diagnosis not present

## 2017-03-03 DIAGNOSIS — Z7189 Other specified counseling: Secondary | ICD-10-CM | POA: Diagnosis not present

## 2017-03-03 DIAGNOSIS — G4733 Obstructive sleep apnea (adult) (pediatric): Secondary | ICD-10-CM | POA: Diagnosis not present

## 2017-03-05 DIAGNOSIS — M6281 Muscle weakness (generalized): Secondary | ICD-10-CM | POA: Diagnosis not present

## 2017-03-05 DIAGNOSIS — I482 Chronic atrial fibrillation: Secondary | ICD-10-CM | POA: Diagnosis not present

## 2017-03-05 DIAGNOSIS — I2510 Atherosclerotic heart disease of native coronary artery without angina pectoris: Secondary | ICD-10-CM | POA: Diagnosis not present

## 2017-03-05 DIAGNOSIS — I10 Essential (primary) hypertension: Secondary | ICD-10-CM | POA: Diagnosis not present

## 2017-03-05 DIAGNOSIS — M5416 Radiculopathy, lumbar region: Secondary | ICD-10-CM | POA: Diagnosis not present

## 2017-03-05 DIAGNOSIS — E7800 Pure hypercholesterolemia, unspecified: Secondary | ICD-10-CM | POA: Diagnosis not present

## 2017-03-05 DIAGNOSIS — I252 Old myocardial infarction: Secondary | ICD-10-CM | POA: Diagnosis not present

## 2017-03-09 DIAGNOSIS — J45909 Unspecified asthma, uncomplicated: Secondary | ICD-10-CM | POA: Diagnosis not present

## 2017-03-09 DIAGNOSIS — M1710 Unilateral primary osteoarthritis, unspecified knee: Secondary | ICD-10-CM | POA: Diagnosis not present

## 2017-03-09 DIAGNOSIS — Z79899 Other long term (current) drug therapy: Secondary | ICD-10-CM | POA: Diagnosis not present

## 2017-03-09 DIAGNOSIS — M5441 Lumbago with sciatica, right side: Secondary | ICD-10-CM | POA: Diagnosis not present

## 2017-03-09 DIAGNOSIS — E785 Hyperlipidemia, unspecified: Secondary | ICD-10-CM | POA: Diagnosis not present

## 2017-03-09 DIAGNOSIS — M5442 Lumbago with sciatica, left side: Secondary | ICD-10-CM | POA: Diagnosis not present

## 2017-03-09 DIAGNOSIS — E1165 Type 2 diabetes mellitus with hyperglycemia: Secondary | ICD-10-CM | POA: Diagnosis not present

## 2017-03-09 DIAGNOSIS — G8929 Other chronic pain: Secondary | ICD-10-CM | POA: Diagnosis not present

## 2017-03-09 DIAGNOSIS — Z23 Encounter for immunization: Secondary | ICD-10-CM | POA: Diagnosis not present

## 2017-03-09 DIAGNOSIS — I10 Essential (primary) hypertension: Secondary | ICD-10-CM | POA: Diagnosis not present

## 2017-03-16 ENCOUNTER — Other Ambulatory Visit: Payer: Self-pay | Admitting: *Deleted

## 2017-03-16 DIAGNOSIS — M5416 Radiculopathy, lumbar region: Secondary | ICD-10-CM | POA: Diagnosis not present

## 2017-03-16 DIAGNOSIS — M6281 Muscle weakness (generalized): Secondary | ICD-10-CM | POA: Diagnosis not present

## 2017-03-17 ENCOUNTER — Other Ambulatory Visit: Payer: Self-pay | Admitting: Family

## 2017-03-17 ENCOUNTER — Other Ambulatory Visit: Payer: Medicare Other

## 2017-03-17 ENCOUNTER — Ambulatory Visit: Payer: Medicare Other | Admitting: Family

## 2017-03-17 DIAGNOSIS — R7989 Other specified abnormal findings of blood chemistry: Secondary | ICD-10-CM

## 2017-03-17 DIAGNOSIS — D500 Iron deficiency anemia secondary to blood loss (chronic): Secondary | ICD-10-CM

## 2017-03-17 DIAGNOSIS — E7211 Homocystinuria: Secondary | ICD-10-CM

## 2017-03-21 DIAGNOSIS — G4733 Obstructive sleep apnea (adult) (pediatric): Secondary | ICD-10-CM | POA: Diagnosis not present

## 2017-03-23 DIAGNOSIS — R7989 Other specified abnormal findings of blood chemistry: Secondary | ICD-10-CM | POA: Diagnosis not present

## 2017-03-23 DIAGNOSIS — M5416 Radiculopathy, lumbar region: Secondary | ICD-10-CM | POA: Diagnosis not present

## 2017-03-23 DIAGNOSIS — M6281 Muscle weakness (generalized): Secondary | ICD-10-CM | POA: Diagnosis not present

## 2017-03-26 ENCOUNTER — Other Ambulatory Visit: Payer: Self-pay

## 2017-03-26 ENCOUNTER — Other Ambulatory Visit (HOSPITAL_BASED_OUTPATIENT_CLINIC_OR_DEPARTMENT_OTHER): Payer: Medicare Other

## 2017-03-26 ENCOUNTER — Ambulatory Visit (HOSPITAL_BASED_OUTPATIENT_CLINIC_OR_DEPARTMENT_OTHER): Payer: Medicare Other | Admitting: Family

## 2017-03-26 ENCOUNTER — Encounter: Payer: Self-pay | Admitting: Family

## 2017-03-26 VITALS — BP 110/72 | HR 83 | Temp 98.3°F | Resp 18 | Wt 304.0 lb

## 2017-03-26 DIAGNOSIS — M7989 Other specified soft tissue disorders: Secondary | ICD-10-CM

## 2017-03-26 DIAGNOSIS — M5416 Radiculopathy, lumbar region: Secondary | ICD-10-CM | POA: Diagnosis not present

## 2017-03-26 DIAGNOSIS — D500 Iron deficiency anemia secondary to blood loss (chronic): Secondary | ICD-10-CM

## 2017-03-26 DIAGNOSIS — M6281 Muscle weakness (generalized): Secondary | ICD-10-CM | POA: Diagnosis not present

## 2017-03-26 DIAGNOSIS — D509 Iron deficiency anemia, unspecified: Secondary | ICD-10-CM

## 2017-03-26 DIAGNOSIS — E7211 Homocystinuria: Secondary | ICD-10-CM | POA: Diagnosis not present

## 2017-03-26 DIAGNOSIS — R7989 Other specified abnormal findings of blood chemistry: Secondary | ICD-10-CM

## 2017-03-26 LAB — CBC WITH DIFFERENTIAL (CANCER CENTER ONLY)
BASO#: 0 10*3/uL (ref 0.0–0.2)
BASO%: 0.4 % (ref 0.0–2.0)
EOS%: 2.6 % (ref 0.0–7.0)
Eosinophils Absolute: 0.1 10*3/uL (ref 0.0–0.5)
HCT: 30.7 % — ABNORMAL LOW (ref 34.8–46.6)
HGB: 9.9 g/dL — ABNORMAL LOW (ref 11.6–15.9)
LYMPH#: 1.2 10*3/uL (ref 0.9–3.3)
LYMPH%: 23 % (ref 14.0–48.0)
MCH: 29.7 pg (ref 26.0–34.0)
MCHC: 32.2 g/dL (ref 32.0–36.0)
MCV: 92 fL (ref 81–101)
MONO#: 0.6 10*3/uL (ref 0.1–0.9)
MONO%: 11.8 % (ref 0.0–13.0)
NEUT#: 3.2 10*3/uL (ref 1.5–6.5)
NEUT%: 62.2 % (ref 39.6–80.0)
PLATELETS: 306 10*3/uL (ref 145–400)
RBC: 3.33 10*6/uL — AB (ref 3.70–5.32)
RDW: 18 % — ABNORMAL HIGH (ref 11.1–15.7)
WBC: 5.1 10*3/uL (ref 3.9–10.0)

## 2017-03-26 NOTE — Progress Notes (Signed)
Hematology and Oncology Follow Up Visit  Kimberly Ramos [id number] Oct 05, 1970 46 y.o. 03/26/2017   Principle Diagnosis:  Iron deficiency anemia   Current Therapy:   IV iron as indicated - last received in October 2018   Interim History:  Kimberly Ramos is here today for follow-up. She received IV iron in October but did not feel much of a difference afterwards. She is still symptomatic with fatigue, SOB with exertion, chills and chewing ice.  She has had no bleeding. She has an IUD and does not have a cycle.  She takes 1 baby aspirin daily and Brilinta and bruises easily.  No lymphadenopathy found on exam.  She has had no fever, n/v, cough, rash, dizziness, chest pain, palpitations, abdominal pain or changes in bowel or bladder habits. She has chronic puffiness in her ankles. This waxes and wanes and she takes lasix daily to help reduce.  Then numbness and tingling in the right lower extremity due to sciatica is unchanged.  Her appetite comes and goes. She is staying well hydrated. Her weight is stable.   ECOG Performance Status: 1 - Symptomatic but completely ambulatory  Medications:  Allergies as of 03/26/2017      Reactions   Ciprofloxacin Hives, Swelling   Lisinopril Hives, Itching   Face swelling  Face swelling  Face swelling       Medication List        Accurate as of 03/26/17  2:39 PM. Always use your most recent med list.          albuterol 108 (90 Base) MCG/ACT inhaler Commonly known as:  PROVENTIL HFA;VENTOLIN HFA Inhale into the lungs.   aspirin EC 81 MG tablet Take 81 mg by mouth daily.   atorvastatin 80 MG tablet Commonly known as:  LIPITOR Take 80 mg by mouth at bedtime.   Azelastine HCl 0.15 % Soln Place 1 spray into the nose 2 (two) times daily as needed.   BD INSULIN SYRINGE U/F 31G X 5/16" 0.3 ML Misc Generic drug:  Insulin Syringe-Needle U-100   Budesonide 90 MCG/ACT inhaler Inhale into the lungs.   carvedilol 25 MG tablet Commonly known as:   COREG Take 25 mg by mouth 2 (two) times daily with a meal.   carvedilol 25 MG tablet Commonly known as:  COREG Take 25 mg by mouth.   cetirizine 10 MG tablet Commonly known as:  ZYRTEC Take by mouth.   cyclobenzaprine 5 MG tablet Commonly known as:  FLEXERIL TAKE 1-2 TAB(S) 1-3 TIMES A DAY AS NEEDED   diclofenac sodium 1 % Gel Commonly known as:  VOLTAREN Apply topically.   EPINEPHrine 0.3 mg/0.3 mL Soaj injection Commonly known as:  EPI-PEN Use as directed for severe allergic reaction.   esomeprazole 40 MG capsule Commonly known as:  NEXIUM TAKE 1 CAPSULE TWICE DAILY 30 MINUTES PRIOR TO BREAKFAST AND DINNER.   ezetimibe 10 MG tablet Commonly known as:  ZETIA 1 TAB(S) ONCE A DAY ORALLY 90 DAYS   fluticasone 44 MCG/ACT inhaler Commonly known as:  FLOVENT HFA INHALE 2 PUFFS EVERY DAY   folic acid 1 MG tablet Commonly known as:  FOLVITE Take 1 tablet (1 mg total) by mouth daily.   furosemide 40 MG tablet Commonly known as:  LASIX TAKE 1 TABLET BY MOUTH EVERY DAY MAY TAKE EXTRA TAB AS NEEDED FOR WEIGHT GAIN/SWELLING   HYDROcodone-acetaminophen 7.5-325 MG tablet Commonly known as:  NORCO Take by mouth.   ibuprofen 800 MG tablet Commonly known as:  ADVIL,MOTRIN   insulin aspart protamine - aspart (70-30) 100 UNIT/ML FlexPen Commonly known as:  NOVOLOG 70/30 MIX Inject into the skin.   isosorbide mononitrate 60 MG 24 hr tablet Commonly known as:  IMDUR Take by mouth.   losartan 100 MG tablet Commonly known as:  COZAAR   nitroGLYCERIN 0.4 MG SL tablet Commonly known as:  NITROSTAT Place under the tongue.   Olopatadine HCl 0.2 % Soln Commonly known as:  PATADAY Place 1 drop into both eyes 1 day or 1 dose.   sitaGLIPtin-metformin 50-500 MG tablet Commonly known as:  JANUMET Take by mouth.   ticagrelor 90 MG Tabs tablet Commonly known as:  BRILINTA TAKE 1 TABLET (90 MG TOTAL) BY MOUTH 2 (TWO) TIMES DAILY.   tiZANidine 2 MG tablet Commonly known as:   ZANAFLEX Take by mouth.   trimethoprim 100 MG tablet Commonly known as:  TRIMPEX Take 100 mg by mouth.   Vitamin D3 1000 units Caps Take by mouth.       Allergies:  Allergies  Allergen Reactions  . Ciprofloxacin Hives and Swelling  . Lisinopril Hives and Itching    Face swelling  Face swelling  Face swelling     Past Medical History, Surgical history, Social history, and Family History were reviewed and updated.  Review of Systems: All other 10 point review of systems is negative.   Physical Exam:  vitals were not taken for this visit.   Wt Readings from Last 3 Encounters:  01/18/17 (!) 306 lb (138.8 kg)  07/02/16 (!) 304 lb (137.9 kg)  04/12/16 (!) 302 lb (137 kg)    Ocular: Sclerae unicteric, pupils equal, round and reactive to light Ear-nose-throat: Oropharynx clear, dentition fair Lymphatic: No cervical, supraclavicular or axillary adenopathy Lungs no rales or rhonchi, good excursion bilaterally Heart regular rate and rhythm, no murmur appreciated Abd soft, nontender, positive bowel sounds, no liver or spleen tip palpated on exam, no fluid wave  MSK no focal spinal tenderness, no joint edema Neuro: non-focal, well-oriented, appropriate affect Breasts: Deferred   Lab Results  Component Value Date   WBC 5.1 03/26/2017   HGB 9.9 (L) 03/26/2017   HCT 30.7 (L) 03/26/2017   MCV 92 03/26/2017   PLT 306 03/26/2017   Lab Results  Component Value Date   FERRITIN 30 01/18/2017   IRON 32 (L) 01/18/2017   TIBC 299 01/18/2017   UIBC 267 01/18/2017   IRONPCTSAT 11 (L) 01/18/2017   Lab Results  Component Value Date   RBC 3.33 (L) 03/26/2017   No results found for: KPAFRELGTCHN, LAMBDASER, KAPLAMBRATIO No results found for: IGGSERUM, IGA, IGMSERUM No results found for: TOTALPROTELP, ALBUMINELP, A1GS, A2GS, BETS, BETA2SER, GAMS, MSPIKE, SPEI   Chemistry      Component Value Date/Time   NA 146 (H) 01/18/2017 1310   K 4.8 (H) 01/18/2017 1310   CL 107  01/18/2017 1310   CO2 27 01/18/2017 1310   BUN 21 01/18/2017 1310   CREATININE 1.4 (H) 01/18/2017 1310      Component Value Date/Time   CALCIUM 9.2 01/18/2017 1310   ALKPHOS 100 (H) 01/18/2017 1310   AST 22 01/18/2017 1310   ALT 19 01/18/2017 1310   BILITOT 0.60 01/18/2017 1310      Impression and Plan: Kimberly Ramos is a very pleasant 46 yo caucasian female with iron deficiency anemia on aspirin and Brilinta. She is symptomatic at this time with fatigue, SOB with exertion, chills and chewing ice.  We will see what her  iron studies show and bring her back in next week for an infusion if needed.  We will plan to see her back again in another 2 months for follow-up. She will contact our office with any questions or concerns. We can certainly see her sooner if need be.   Verdie Mosher, NP 11/30/20182:39 PM

## 2017-03-27 LAB — RETICULOCYTES: Reticulocyte Count: 2.8 % — ABNORMAL HIGH (ref 0.6–2.6)

## 2017-03-29 LAB — IRON AND TIBC
%SAT: 20 % — ABNORMAL LOW (ref 21–57)
Iron: 52 ug/dL (ref 41–142)
TIBC: 258 ug/dL (ref 236–444)
UIBC: 206 ug/dL (ref 120–384)

## 2017-03-29 LAB — FERRITIN: Ferritin: 70 ng/ml (ref 9–269)

## 2017-03-29 LAB — HOMOCYSTEINE: HOMOCYSTEINE: 13.9 umol/L (ref 0.0–15.0)

## 2017-03-30 DIAGNOSIS — M1990 Unspecified osteoarthritis, unspecified site: Secondary | ICD-10-CM | POA: Diagnosis not present

## 2017-03-30 DIAGNOSIS — M5416 Radiculopathy, lumbar region: Secondary | ICD-10-CM | POA: Diagnosis not present

## 2017-03-30 DIAGNOSIS — I10 Essential (primary) hypertension: Secondary | ICD-10-CM | POA: Diagnosis not present

## 2017-03-30 DIAGNOSIS — R7989 Other specified abnormal findings of blood chemistry: Secondary | ICD-10-CM | POA: Diagnosis not present

## 2017-03-30 DIAGNOSIS — M6281 Muscle weakness (generalized): Secondary | ICD-10-CM | POA: Diagnosis not present

## 2017-03-31 DIAGNOSIS — I2109 ST elevation (STEMI) myocardial infarction involving other coronary artery of anterior wall: Secondary | ICD-10-CM | POA: Diagnosis not present

## 2017-03-31 DIAGNOSIS — Z87891 Personal history of nicotine dependence: Secondary | ICD-10-CM | POA: Diagnosis not present

## 2017-04-07 ENCOUNTER — Ambulatory Visit: Payer: Medicare Other

## 2017-04-12 ENCOUNTER — Ambulatory Visit: Payer: Medicare Other

## 2017-04-12 DIAGNOSIS — K219 Gastro-esophageal reflux disease without esophagitis: Secondary | ICD-10-CM | POA: Diagnosis not present

## 2017-04-12 DIAGNOSIS — Z87891 Personal history of nicotine dependence: Secondary | ICD-10-CM | POA: Diagnosis not present

## 2017-04-12 DIAGNOSIS — I10 Essential (primary) hypertension: Secondary | ICD-10-CM | POA: Diagnosis not present

## 2017-04-12 DIAGNOSIS — Z7902 Long term (current) use of antithrombotics/antiplatelets: Secondary | ICD-10-CM | POA: Diagnosis not present

## 2017-04-12 DIAGNOSIS — J4530 Mild persistent asthma, uncomplicated: Secondary | ICD-10-CM | POA: Diagnosis not present

## 2017-04-12 DIAGNOSIS — K2950 Unspecified chronic gastritis without bleeding: Secondary | ICD-10-CM | POA: Diagnosis not present

## 2017-04-12 DIAGNOSIS — G4733 Obstructive sleep apnea (adult) (pediatric): Secondary | ICD-10-CM | POA: Diagnosis not present

## 2017-04-12 DIAGNOSIS — Z7984 Long term (current) use of oral hypoglycemic drugs: Secondary | ICD-10-CM | POA: Diagnosis not present

## 2017-04-12 DIAGNOSIS — I2510 Atherosclerotic heart disease of native coronary artery without angina pectoris: Secondary | ICD-10-CM | POA: Diagnosis not present

## 2017-04-12 DIAGNOSIS — Z955 Presence of coronary angioplasty implant and graft: Secondary | ICD-10-CM | POA: Diagnosis not present

## 2017-04-12 DIAGNOSIS — E1142 Type 2 diabetes mellitus with diabetic polyneuropathy: Secondary | ICD-10-CM | POA: Diagnosis not present

## 2017-04-12 DIAGNOSIS — Z7982 Long term (current) use of aspirin: Secondary | ICD-10-CM | POA: Diagnosis not present

## 2017-04-12 DIAGNOSIS — I252 Old myocardial infarction: Secondary | ICD-10-CM | POA: Diagnosis not present

## 2017-04-12 DIAGNOSIS — Z8673 Personal history of transient ischemic attack (TIA), and cerebral infarction without residual deficits: Secondary | ICD-10-CM | POA: Diagnosis not present

## 2017-04-20 DIAGNOSIS — G4733 Obstructive sleep apnea (adult) (pediatric): Secondary | ICD-10-CM | POA: Diagnosis not present

## 2017-05-06 ENCOUNTER — Other Ambulatory Visit: Payer: Self-pay

## 2017-05-06 ENCOUNTER — Inpatient Hospital Stay: Payer: Medicare Other | Attending: Hematology & Oncology

## 2017-05-06 VITALS — BP 124/68 | HR 78 | Temp 98.1°F | Resp 18

## 2017-05-06 DIAGNOSIS — Z7982 Long term (current) use of aspirin: Secondary | ICD-10-CM | POA: Diagnosis not present

## 2017-05-06 DIAGNOSIS — D509 Iron deficiency anemia, unspecified: Secondary | ICD-10-CM | POA: Insufficient documentation

## 2017-05-06 DIAGNOSIS — D500 Iron deficiency anemia secondary to blood loss (chronic): Secondary | ICD-10-CM

## 2017-05-06 MED ORDER — SODIUM CHLORIDE 0.9 % IV SOLN
510.0000 mg | Freq: Once | INTRAVENOUS | Status: AC
  Administered 2017-05-06: 510 mg via INTRAVENOUS
  Filled 2017-05-06: qty 17

## 2017-05-06 MED ORDER — SODIUM CHLORIDE 0.9 % IV SOLN
Freq: Once | INTRAVENOUS | Status: AC
  Administered 2017-05-06: 12:00:00 via INTRAVENOUS

## 2017-05-06 NOTE — Patient Instructions (Signed)

## 2017-05-07 DIAGNOSIS — E785 Hyperlipidemia, unspecified: Secondary | ICD-10-CM | POA: Diagnosis not present

## 2017-05-07 DIAGNOSIS — J45909 Unspecified asthma, uncomplicated: Secondary | ICD-10-CM | POA: Diagnosis not present

## 2017-05-07 DIAGNOSIS — E1165 Type 2 diabetes mellitus with hyperglycemia: Secondary | ICD-10-CM | POA: Diagnosis not present

## 2017-05-07 DIAGNOSIS — I10 Essential (primary) hypertension: Secondary | ICD-10-CM | POA: Diagnosis not present

## 2017-05-07 DIAGNOSIS — M1710 Unilateral primary osteoarthritis, unspecified knee: Secondary | ICD-10-CM | POA: Diagnosis not present

## 2017-05-13 DIAGNOSIS — M5416 Radiculopathy, lumbar region: Secondary | ICD-10-CM | POA: Diagnosis not present

## 2017-05-13 DIAGNOSIS — Z1231 Encounter for screening mammogram for malignant neoplasm of breast: Secondary | ICD-10-CM | POA: Diagnosis not present

## 2017-05-13 DIAGNOSIS — Z79899 Other long term (current) drug therapy: Secondary | ICD-10-CM | POA: Diagnosis not present

## 2017-05-18 DIAGNOSIS — G4733 Obstructive sleep apnea (adult) (pediatric): Secondary | ICD-10-CM | POA: Diagnosis not present

## 2017-05-20 DIAGNOSIS — N760 Acute vaginitis: Secondary | ICD-10-CM | POA: Diagnosis not present

## 2017-05-21 DIAGNOSIS — G4733 Obstructive sleep apnea (adult) (pediatric): Secondary | ICD-10-CM | POA: Diagnosis not present

## 2017-05-26 ENCOUNTER — Encounter: Payer: Self-pay | Admitting: Family

## 2017-05-26 ENCOUNTER — Inpatient Hospital Stay (HOSPITAL_BASED_OUTPATIENT_CLINIC_OR_DEPARTMENT_OTHER): Payer: Medicare Other | Admitting: Family

## 2017-05-26 ENCOUNTER — Other Ambulatory Visit: Payer: Self-pay

## 2017-05-26 ENCOUNTER — Inpatient Hospital Stay: Payer: Medicare Other

## 2017-05-26 VITALS — BP 149/87 | HR 110 | Temp 98.4°F | Resp 20 | Wt 270.0 lb

## 2017-05-26 DIAGNOSIS — Z7982 Long term (current) use of aspirin: Secondary | ICD-10-CM

## 2017-05-26 DIAGNOSIS — D509 Iron deficiency anemia, unspecified: Secondary | ICD-10-CM | POA: Diagnosis not present

## 2017-05-26 DIAGNOSIS — D500 Iron deficiency anemia secondary to blood loss (chronic): Secondary | ICD-10-CM

## 2017-05-26 LAB — CBC WITH DIFFERENTIAL (CANCER CENTER ONLY)
BASOS ABS: 0 10*3/uL (ref 0.0–0.1)
BASOS PCT: 0 %
EOS PCT: 4 %
Eosinophils Absolute: 0.2 10*3/uL (ref 0.0–0.5)
HEMATOCRIT: 39.3 % (ref 34.8–46.6)
Hemoglobin: 13 g/dL (ref 11.6–15.9)
Lymphocytes Relative: 30 %
Lymphs Abs: 1.4 10*3/uL (ref 0.9–3.3)
MCH: 29.1 pg (ref 26.0–34.0)
MCHC: 33.1 g/dL (ref 32.0–36.0)
MCV: 88.1 fL (ref 81.0–101.0)
MONO ABS: 0.6 10*3/uL (ref 0.1–0.9)
MONOS PCT: 13 %
NEUTROS ABS: 2.4 10*3/uL (ref 1.5–6.5)
Neutrophils Relative %: 53 %
PLATELETS: 229 10*3/uL (ref 145–400)
RBC: 4.46 MIL/uL (ref 3.70–5.32)
RDW: 14.7 % (ref 11.1–15.7)
WBC Count: 4.5 10*3/uL (ref 3.9–10.3)

## 2017-05-26 LAB — FERRITIN: Ferritin: 288 ng/mL — ABNORMAL HIGH (ref 9–269)

## 2017-05-26 LAB — RETICULOCYTES
RBC.: 4.43 MIL/uL (ref 3.70–5.45)
Retic Count, Absolute: 35.4 K/uL (ref 33.7–90.7)
Retic Ct Pct: 0.8 % (ref 0.7–2.1)

## 2017-05-26 LAB — IRON AND TIBC
Iron: 67 ug/dL (ref 41–142)
Saturation Ratios: 32 % (ref 21–57)
TIBC: 205 ug/dL — ABNORMAL LOW (ref 236–444)
UIBC: 139 ug/dL

## 2017-05-26 NOTE — Progress Notes (Signed)
Hematology and Oncology Follow Up Visit  Kimberly Ramos [id number] Dec 01, 1970 46 y.o. 05/26/2017   Principle Diagnosis:  Iron deficiency anemia   Current Therapy:   IV iron as indicated - last received in January 2019   Interim History:  Kimberly Ramos is here today for follow-up. She is feeling much better since receiving iron earlier this month. Her Hgb is now 13.0 with an MCV of 88.1.  She had the gastric sleeve surgery in December and is down 34 lbs since her last visit.  She still has her IUD in place and has no cycle.  No bleeding, no bruising or petechiae. No lymphadenopathy found on exam.  No fever, chills, n/v, cough, rash, dizziness, SOB, chest pain, palpitations, abdominal pain or changes in bowel or bladder habits.  No swelling, tenderness, numbness or tingling in her extremities. No c/o pain.  She has a good appetite and is staying well hydrated.  ECOG Performance Status: 0 - Asymptomatic  Medications:  Allergies as of 05/26/2017      Reactions   Ciprofloxacin Hives, Swelling   Lisinopril Hives, Itching   Face swelling  Face swelling  Face swelling  Face swelling  Face swelling  Face swelling  Face swelling  Face swelling       Medication List        Accurate as of 05/26/17 10:35 AM. Always use your most recent med list.          albuterol 108 (90 Base) MCG/ACT inhaler Commonly known as:  PROVENTIL HFA;VENTOLIN HFA Inhale into the lungs.   aspirin EC 81 MG tablet Take 81 mg by mouth daily.   atorvastatin 80 MG tablet Commonly known as:  LIPITOR Take 80 mg by mouth at bedtime.   Azelastine HCl 0.15 % Soln Place 1 spray into the nose 2 (two) times daily as needed.   BD INSULIN SYRINGE U/F 31G X 5/16" 0.3 ML Misc Generic drug:  Insulin Syringe-Needle U-100   Budesonide 90 MCG/ACT inhaler Inhale into the lungs.   carvedilol 25 MG tablet Commonly known as:  COREG Take 25 mg by mouth 2 (two) times daily with a meal.   cetirizine 10 MG  tablet Commonly known as:  ZYRTEC Take by mouth.   diclofenac sodium 1 % Gel Commonly known as:  VOLTAREN Apply topically.   EPINEPHrine 0.3 mg/0.3 mL Soaj injection Commonly known as:  EPI-PEN Use as directed for severe allergic reaction.   esomeprazole 40 MG capsule Commonly known as:  NEXIUM TAKE 1 CAPSULE TWICE DAILY 30 MINUTES PRIOR TO BREAKFAST AND DINNER.   ezetimibe 10 MG tablet Commonly known as:  ZETIA 1 TAB(S) ONCE A DAY ORALLY 90 DAYS   fluticasone 44 MCG/ACT inhaler Commonly known as:  FLOVENT HFA INHALE 2 PUFFS EVERY DAY   folic acid 1 MG tablet Commonly known as:  FOLVITE Take 1 tablet (1 mg total) by mouth daily.   furosemide 40 MG tablet Commonly known as:  LASIX TAKE 1 TABLET BY MOUTH EVERY DAY MAY TAKE EXTRA TAB AS NEEDED FOR WEIGHT GAIN/SWELLING   HYDROcodone-acetaminophen 7.5-325 MG tablet Commonly known as:  NORCO Take by mouth.   ibuprofen 800 MG tablet Commonly known as:  ADVIL,MOTRIN   isosorbide mononitrate 60 MG 24 hr tablet Commonly known as:  IMDUR Take by mouth.   losartan 100 MG tablet Commonly known as:  COZAAR   nitroGLYCERIN 0.4 MG SL tablet Commonly known as:  NITROSTAT Place under the tongue.   Olopatadine HCl 0.2 %  Soln Commonly known as:  PATADAY Place 1 drop into both eyes 1 day or 1 dose.   sitaGLIPtin-metformin 50-500 MG tablet Commonly known as:  JANUMET Take by mouth.   ticagrelor 90 MG Tabs tablet Commonly known as:  BRILINTA TAKE 1 TABLET (90 MG TOTAL) BY MOUTH 2 (TWO) TIMES DAILY.   tiZANidine 2 MG tablet Commonly known as:  ZANAFLEX Take by mouth.   trimethoprim 100 MG tablet Commonly known as:  TRIMPEX Take 100 mg by mouth.       Allergies:  Allergies  Allergen Reactions  . Ciprofloxacin Hives and Swelling  . Lisinopril Hives and Itching    Face swelling  Face swelling  Face swelling  Face swelling  Face swelling  Face swelling  Face swelling  Face swelling     Past Medical History,  Surgical history, Social history, and Family History were reviewed and updated.  Review of Systems: All other 10 point review of systems is negative.   Physical Exam:  vitals were not taken for this visit.   Wt Readings from Last 3 Encounters:  03/26/17 (!) 304 lb (137.9 kg)  01/18/17 (!) 306 lb (138.8 kg)  07/02/16 (!) 304 lb (137.9 kg)    Ocular: Sclerae unicteric, pupils equal, round and reactive to light Ear-nose-throat: Oropharynx clear, dentition fair Lymphatic: No cervical, supraclavicular or axillary adenopathy Lungs no rales or rhonchi, good excursion bilaterally Heart regular rate and rhythm, no murmur appreciated Abd soft, nontender, positive bowel sounds, no liver or spleen tip palpated on exam, no fluid wave  MSK no focal spinal tenderness, no joint edema Neuro: non-focal, well-oriented, appropriate affect Breasts: Deferred   Lab Results  Component Value Date   WBC 5.1 03/26/2017   HGB 9.9 (L) 03/26/2017   HCT 30.7 (L) 03/26/2017   MCV 92 03/26/2017   PLT 306 03/26/2017   Lab Results  Component Value Date   FERRITIN 70 03/26/2017   IRON 52 03/26/2017   TIBC 258 03/26/2017   UIBC 206 03/26/2017   IRONPCTSAT 20 (L) 03/26/2017   Lab Results  Component Value Date   RBC 3.33 (L) 03/26/2017   No results found for: KPAFRELGTCHN, LAMBDASER, KAPLAMBRATIO No results found for: IGGSERUM, IGA, IGMSERUM No results found for: TOTALPROTELP, ALBUMINELP, A1GS, A2GS, BETS, BETA2SER, GAMS, MSPIKE, SPEI   Chemistry      Component Value Date/Time   NA 146 (H) 01/18/2017 1310   K 4.8 (H) 01/18/2017 1310   CL 107 01/18/2017 1310   CO2 27 01/18/2017 1310   BUN 21 01/18/2017 1310   CREATININE 1.4 (H) 01/18/2017 1310      Component Value Date/Time   CALCIUM 9.2 01/18/2017 1310   ALKPHOS 100 (H) 01/18/2017 1310   AST 22 01/18/2017 1310   ALT 19 01/18/2017 1310   BILITOT 0.60 01/18/2017 1310      Impression and Plan: Kimberly Ramos is a very pleasant 47 yo caucasian  female with iron deficiency anemia on aspirin and Brilinta. She has responded nicely to the IV iron she received 2 weeks ago. She has no complaints at this time and feels that her energy is much improved.  We will see what her iron studies show and bring her back in for an infusion if needed.  She will contact our office with any questions or concerns. We can certainly see her sooner if need be.   Emeline Gins, NP 1/30/201910:35 AM

## 2017-05-27 DIAGNOSIS — M791 Myalgia, unspecified site: Secondary | ICD-10-CM | POA: Diagnosis not present

## 2017-05-31 DIAGNOSIS — G4733 Obstructive sleep apnea (adult) (pediatric): Secondary | ICD-10-CM | POA: Diagnosis not present

## 2017-06-08 DIAGNOSIS — I10 Essential (primary) hypertension: Secondary | ICD-10-CM | POA: Diagnosis not present

## 2017-06-08 DIAGNOSIS — E785 Hyperlipidemia, unspecified: Secondary | ICD-10-CM | POA: Diagnosis not present

## 2017-06-08 DIAGNOSIS — E1165 Type 2 diabetes mellitus with hyperglycemia: Secondary | ICD-10-CM | POA: Diagnosis not present

## 2017-06-08 DIAGNOSIS — M1710 Unilateral primary osteoarthritis, unspecified knee: Secondary | ICD-10-CM | POA: Diagnosis not present

## 2017-06-08 DIAGNOSIS — J45909 Unspecified asthma, uncomplicated: Secondary | ICD-10-CM | POA: Diagnosis not present

## 2017-06-08 DIAGNOSIS — I2510 Atherosclerotic heart disease of native coronary artery without angina pectoris: Secondary | ICD-10-CM | POA: Diagnosis not present

## 2017-06-21 DIAGNOSIS — G4733 Obstructive sleep apnea (adult) (pediatric): Secondary | ICD-10-CM | POA: Diagnosis not present

## 2017-06-24 DIAGNOSIS — M791 Myalgia, unspecified site: Secondary | ICD-10-CM | POA: Diagnosis not present

## 2017-07-19 DIAGNOSIS — G4733 Obstructive sleep apnea (adult) (pediatric): Secondary | ICD-10-CM | POA: Diagnosis not present

## 2017-07-22 DIAGNOSIS — M791 Myalgia, unspecified site: Secondary | ICD-10-CM | POA: Diagnosis not present

## 2017-08-11 DIAGNOSIS — G8929 Other chronic pain: Secondary | ICD-10-CM | POA: Diagnosis not present

## 2017-08-11 DIAGNOSIS — Z79899 Other long term (current) drug therapy: Secondary | ICD-10-CM | POA: Diagnosis not present

## 2017-08-11 DIAGNOSIS — M5441 Lumbago with sciatica, right side: Secondary | ICD-10-CM | POA: Diagnosis not present

## 2017-08-19 DIAGNOSIS — G4733 Obstructive sleep apnea (adult) (pediatric): Secondary | ICD-10-CM | POA: Diagnosis not present

## 2017-08-22 DIAGNOSIS — M791 Myalgia, unspecified site: Secondary | ICD-10-CM | POA: Diagnosis not present

## 2017-08-25 ENCOUNTER — Inpatient Hospital Stay: Payer: Medicare Other | Attending: Family | Admitting: Family

## 2017-08-25 ENCOUNTER — Encounter: Payer: Self-pay | Admitting: Family

## 2017-08-25 ENCOUNTER — Inpatient Hospital Stay: Payer: Medicare Other

## 2017-08-25 ENCOUNTER — Telehealth: Payer: Self-pay | Admitting: *Deleted

## 2017-08-25 ENCOUNTER — Other Ambulatory Visit: Payer: Self-pay

## 2017-08-25 VITALS — BP 123/90 | HR 74 | Temp 98.2°F | Resp 20 | Wt 242.1 lb

## 2017-08-25 DIAGNOSIS — D508 Other iron deficiency anemias: Secondary | ICD-10-CM

## 2017-08-25 DIAGNOSIS — Z8673 Personal history of transient ischemic attack (TIA), and cerebral infarction without residual deficits: Secondary | ICD-10-CM | POA: Diagnosis not present

## 2017-08-25 DIAGNOSIS — K909 Intestinal malabsorption, unspecified: Secondary | ICD-10-CM

## 2017-08-25 DIAGNOSIS — Z9884 Bariatric surgery status: Secondary | ICD-10-CM | POA: Diagnosis not present

## 2017-08-25 DIAGNOSIS — D500 Iron deficiency anemia secondary to blood loss (chronic): Secondary | ICD-10-CM

## 2017-08-25 DIAGNOSIS — Z955 Presence of coronary angioplasty implant and graft: Secondary | ICD-10-CM | POA: Diagnosis not present

## 2017-08-25 DIAGNOSIS — D509 Iron deficiency anemia, unspecified: Secondary | ICD-10-CM

## 2017-08-25 DIAGNOSIS — I214 Non-ST elevation (NSTEMI) myocardial infarction: Secondary | ICD-10-CM

## 2017-08-25 DIAGNOSIS — Z7982 Long term (current) use of aspirin: Secondary | ICD-10-CM | POA: Diagnosis not present

## 2017-08-25 LAB — IRON AND TIBC
IRON: 64 ug/dL (ref 41–142)
SATURATION RATIOS: 30 % (ref 21–57)
TIBC: 218 ug/dL — ABNORMAL LOW (ref 236–444)
UIBC: 153 ug/dL

## 2017-08-25 LAB — CBC WITH DIFFERENTIAL (CANCER CENTER ONLY)
BASOS ABS: 0 10*3/uL (ref 0.0–0.1)
BASOS PCT: 1 %
EOS PCT: 6 %
Eosinophils Absolute: 0.3 10*3/uL (ref 0.0–0.5)
HCT: 30.9 % — ABNORMAL LOW (ref 34.8–46.6)
Hemoglobin: 10.2 g/dL — ABNORMAL LOW (ref 11.6–15.9)
Lymphocytes Relative: 32 %
Lymphs Abs: 1.4 10*3/uL (ref 0.9–3.3)
MCH: 31 pg (ref 26.0–34.0)
MCHC: 33 g/dL (ref 32.0–36.0)
MCV: 93.9 fL (ref 81.0–101.0)
MONO ABS: 0.4 10*3/uL (ref 0.1–0.9)
Monocytes Relative: 9 %
NEUTROS ABS: 2.3 10*3/uL (ref 1.5–6.5)
Neutrophils Relative %: 52 %
PLATELETS: 261 10*3/uL (ref 145–400)
RBC: 3.29 MIL/uL — AB (ref 3.70–5.32)
RDW: 14.6 % (ref 11.1–15.7)
WBC: 4.3 10*3/uL (ref 3.9–10.0)

## 2017-08-25 LAB — RETICULOCYTES
RBC.: 3.31 MIL/uL — ABNORMAL LOW (ref 3.70–5.45)
Retic Count, Absolute: 56.3 10*3/uL (ref 33.7–90.7)
Retic Ct Pct: 1.7 % (ref 0.7–2.1)

## 2017-08-25 LAB — FERRITIN: FERRITIN: 186 ng/mL (ref 9–269)

## 2017-08-25 MED ORDER — FOLIC ACID 1 MG PO TABS: 1.0000 mg | ORAL_TABLET | Freq: Every day | ORAL | 6 refills | Status: DC

## 2017-08-25 NOTE — Progress Notes (Signed)
Hematology and Oncology Follow Up Visit  Kimberly Ramos [id number] 15-Mar-1971 47 y.o. 08/25/2017   Principle Diagnosis:  Iron deficiency anemiasecondary to malabsorption (gastric sleeve 2018)  Current Therapy:   IV iron as indicated - last received in January 2019   Interim History:  Ms. Abelson is here today for follow-up. She is symptomatic with fatigues  and chills.  Hgb is 10.2, MCV 93.9 and platelet count 261.  She denies any episodes of bleeding, no blood noted in stool. No bruising or petechiae.  She is taking her daily aspirin and Brilinta BID as prescribed.  She is out of folic acid and has not been taking. Will refill this today.  No fever, n/v, cough, rash, dizziness, SOB, chest pain, palpitations, abdominal pain or changes in bowel or bladder habits.  She takes lasix as needed to prevent any swelling.  No numbness or tingling in her extremities. She has sciatica in the right lower extremity since her stroke in 2005 and is trying to stay active. She is using her home elliptical, treadmill and walking outside some now that it is warm.  No lymphadenopathy noted on exam.  Her appetite comes and goes. She drinks a lot of water and juice which fills her up. She tries her best to graze having 6 small meals a day. Her weight is down another 28 lbs since her last visit which is quite exciting for her.  She states that she is now off of insulin and her blood sugars are well controlled on Janumet.   ECOG Performance Status: 1 - Symptomatic but completely ambulatory   Medications:  Allergies as of 08/25/2017      Reactions   Ciprofloxacin Hives, Swelling   Lisinopril Hives, Itching   Face swelling  Face swelling  Face swelling  Face swelling  Face swelling  Face swelling  Face swelling  Face swelling       Medication List        Accurate as of 08/25/17  9:58 AM. Always use your most recent med list.          albuterol 108 (90 Base) MCG/ACT inhaler Commonly known as:   PROVENTIL HFA;VENTOLIN HFA Inhale into the lungs.   aspirin EC 81 MG tablet Take 81 mg by mouth daily.   atorvastatin 80 MG tablet Commonly known as:  LIPITOR Take 80 mg by mouth at bedtime.   Azelastine HCl 0.15 % Soln Place into the nose.   Azelastine HCl 0.15 % Soln Place 1 spray into the nose 2 (two) times daily as needed.   BD INSULIN SYRINGE U/F 31G X 5/16" 0.3 ML Misc Generic drug:  Insulin Syringe-Needle U-100   Budesonide 90 MCG/ACT inhaler Inhale into the lungs.   carvedilol 25 MG tablet Commonly known as:  COREG Take 25 mg by mouth 2 (two) times daily with a meal.   cetirizine 10 MG tablet Commonly known as:  ZYRTEC Take by mouth.   diclofenac sodium 1 % Gel Commonly known as:  VOLTAREN Apply topically.   EPINEPHrine 0.3 mg/0.3 mL Soaj injection Commonly known as:  EPI-PEN Use as directed for severe allergic reaction.   esomeprazole 40 MG capsule Commonly known as:  NEXIUM TAKE 1 CAPSULE TWICE DAILY 30 MINUTES PRIOR TO BREAKFAST AND DINNER.   ezetimibe 10 MG tablet Commonly known as:  ZETIA 1 TAB(S) ONCE A DAY ORALLY 90 DAYS   fluticasone 44 MCG/ACT inhaler Commonly known as:  FLOVENT HFA INHALE 2 PUFFS EVERY DAY   folic  acid 1 MG tablet Commonly known as:  FOLVITE Take 1 tablet (1 mg total) by mouth daily.   furosemide 40 MG tablet Commonly known as:  LASIX TAKE 1 TABLET BY MOUTH EVERY DAY MAY TAKE EXTRA TAB AS NEEDED FOR WEIGHT GAIN/SWELLING   gabapentin 300 MG capsule Commonly known as:  NEURONTIN Take 600 mg by mouth at bedtime.   HUMALOG MIX 75/25 KWIKPEN (75-25) 100 UNIT/ML Kwikpen Generic drug:  Insulin Lispro Prot & Lispro Over 200, 40Units   HYDROcodone-acetaminophen 7.5-325 MG tablet Commonly known as:  NORCO Take by mouth.   ibuprofen 800 MG tablet Commonly known as:  ADVIL,MOTRIN   isosorbide mononitrate 60 MG 24 hr tablet Commonly known as:  IMDUR Take by mouth.   losartan 100 MG tablet Commonly known as:   COZAAR 100 mg daily.   metroNIDAZOLE 0.75 % vaginal gel Commonly known as:  METROGEL USE EVERY NIGHT VAGINALLY   nitroGLYCERIN 0.4 MG SL tablet Commonly known as:  NITROSTAT Place under the tongue.   Olopatadine HCl 0.2 % Soln Commonly known as:  PATADAY Place 1 drop into both eyes 1 day or 1 dose.   omeprazole 20 MG capsule Commonly known as:  PRILOSEC 20 mg 2 (two) times daily.   ONETOUCH DELICA LANCETS 33G Misc 2 (two) times daily. for testing   [facility] VERIO test strip Generic drug:  glucose blood 2 (two) times daily. for testing   sitaGLIPtin-metformin 50-500 MG tablet Commonly known as:  JANUMET Take by mouth.   ticagrelor 90 MG Tabs tablet Commonly known as:  BRILINTA TAKE 1 TABLET (90 MG TOTAL) BY MOUTH 2 (TWO) TIMES DAILY.   tiZANidine 2 MG tablet Commonly known as:  ZANAFLEX Take by mouth.   trimethoprim 100 MG tablet Commonly known as:  TRIMPEX Take 100 mg by mouth.   Vitamin D3 50000 units Caps 50,000 Units once a week.       Allergies:  Allergies  Allergen Reactions  . Ciprofloxacin Hives and Swelling  . Lisinopril Hives and Itching    Face swelling  Face swelling  Face swelling  Face swelling  Face swelling  Face swelling  Face swelling  Face swelling     Past Medical History, Surgical history, Social history, and Family History were reviewed and updated.  Review of Systems: All other 10 point review of systems is negative.   Physical Exam:  vitals were not taken for this visit.   Wt Readings from Last 3 Encounters:  05/26/17 270 lb (122.5 kg)  03/26/17 (!) 304 lb (137.9 kg)  01/18/17 (!) 306 lb (138.8 kg)    Ocular: Sclerae unicteric, pupils equal, round and reactive to light Ear-nose-throat: Oropharynx clear, dentition fair Lymphatic: No cervical, supraclavicular or axillary adenopathy Lungs no rales or rhonchi, good excursion bilaterally Heart regular rate and rhythm, no murmur appreciated Abd soft, nontender,  positive bowel sounds, no liver or spleen tip palpated on exam, no fluid wave MSK no focal spinal tenderness, no joint edema Neuro: non-focal, well-oriented, appropriate affect Breasts: Deferred   Lab Results  Component Value Date   WBC 4.3 08/25/2017   HGB 10.2 (L) 08/25/2017   HCT 30.9 (L) 08/25/2017   MCV 93.9 08/25/2017   PLT 261 08/25/2017   Lab Results  Component Value Date   FERRITIN 288 (H) 05/26/2017   IRON 67 05/26/2017   TIBC 205 (L) 05/26/2017   UIBC 139 05/26/2017   IRONPCTSAT 32 05/26/2017   Lab Results  Component Value Date   RETICCTPCT 0.8  05/26/2017   RBC 3.29 (L) 08/25/2017   No results found for: KPAFRELGTCHN, LAMBDASER, KAPLAMBRATIO No results found for: IGGSERUM, IGA, IGMSERUM No results found for: TOTALPROTELP, ALBUMINELP, A1GS, A2GS, BETS, BETA2SER, GAMS, MSPIKE, SPEI   Chemistry      Component Value Date/Time   NA 146 (H) 01/18/2017 1310   K 4.8 (H) 01/18/2017 1310   CL 107 01/18/2017 1310   CO2 27 01/18/2017 1310   BUN 21 01/18/2017 1310   CREATININE 1.4 (H) 01/18/2017 1310      Component Value Date/Time   CALCIUM 9.2 01/18/2017 1310   ALKPHOS 100 (H) 01/18/2017 1310   AST 22 01/18/2017 1310   ALT 19 01/18/2017 1310   BILITOT 0.60 01/18/2017 1310      Impression and Plan: Ms. Friis is a very pleasant 47 yo African American female with iron deficiency anemia secondary to malabsorption (gastric sleeve 2018). She is on baby aspirin and Brilinta as well for history of stroke and stent placement. She will restart her folic acid today.  Hgb is down at 10.2 and she is symptomatic with fatigue, dizziness and chills. We will see what her iron saturation shows and bring her back in for infusion if needed.  We will see her back in another 8 weeks for follow-up.  She will contact our office with any questions or concerns. We can certainly see her sooner if need be.   Emeline Gins, NP 5/1/20199:58 AM

## 2017-08-25 NOTE — Telephone Encounter (Addendum)
Patient is aware of results  ----- Message from Verdie Mosher, NP sent at 08/25/2017  3:45 PM EDT ----- Regarding: Iron  Her iron studies look good. No infusion needed this time (we though she might). Thank you!  Sarah  ----- Message ----- From: Leory Plowman, [facility] [place] Sent: 08/25/2017   9:48 AM To: Verdie Mosher, NP

## 2017-09-07 DIAGNOSIS — I10 Essential (primary) hypertension: Secondary | ICD-10-CM | POA: Diagnosis not present

## 2017-09-07 DIAGNOSIS — E1165 Type 2 diabetes mellitus with hyperglycemia: Secondary | ICD-10-CM | POA: Diagnosis not present

## 2017-09-07 DIAGNOSIS — E785 Hyperlipidemia, unspecified: Secondary | ICD-10-CM | POA: Diagnosis not present

## 2017-09-07 DIAGNOSIS — M1710 Unilateral primary osteoarthritis, unspecified knee: Secondary | ICD-10-CM | POA: Diagnosis not present

## 2017-09-07 DIAGNOSIS — J45909 Unspecified asthma, uncomplicated: Secondary | ICD-10-CM | POA: Diagnosis not present

## 2017-09-18 DIAGNOSIS — G4733 Obstructive sleep apnea (adult) (pediatric): Secondary | ICD-10-CM | POA: Diagnosis not present

## 2017-09-21 DIAGNOSIS — E1165 Type 2 diabetes mellitus with hyperglycemia: Secondary | ICD-10-CM | POA: Diagnosis not present

## 2017-09-21 DIAGNOSIS — Z0110 Encounter for examination of ears and hearing without abnormal findings: Secondary | ICD-10-CM | POA: Diagnosis not present

## 2017-09-21 DIAGNOSIS — E785 Hyperlipidemia, unspecified: Secondary | ICD-10-CM | POA: Diagnosis not present

## 2017-09-21 DIAGNOSIS — Z0000 Encounter for general adult medical examination without abnormal findings: Secondary | ICD-10-CM | POA: Diagnosis not present

## 2017-09-21 DIAGNOSIS — M791 Myalgia, unspecified site: Secondary | ICD-10-CM | POA: Diagnosis not present

## 2017-10-11 DIAGNOSIS — G4733 Obstructive sleep apnea (adult) (pediatric): Secondary | ICD-10-CM | POA: Diagnosis not present

## 2017-10-11 DIAGNOSIS — E785 Hyperlipidemia, unspecified: Secondary | ICD-10-CM | POA: Diagnosis not present

## 2017-10-11 DIAGNOSIS — K219 Gastro-esophageal reflux disease without esophagitis: Secondary | ICD-10-CM | POA: Diagnosis not present

## 2017-10-11 DIAGNOSIS — I10 Essential (primary) hypertension: Secondary | ICD-10-CM | POA: Diagnosis not present

## 2017-10-22 DIAGNOSIS — M791 Myalgia, unspecified site: Secondary | ICD-10-CM | POA: Diagnosis not present

## 2017-10-25 ENCOUNTER — Inpatient Hospital Stay: Payer: Medicare Other

## 2017-10-25 ENCOUNTER — Inpatient Hospital Stay: Payer: Medicare Other | Attending: Family | Admitting: Family

## 2017-10-25 ENCOUNTER — Encounter: Payer: Self-pay | Admitting: Family

## 2017-10-25 ENCOUNTER — Other Ambulatory Visit: Payer: Self-pay

## 2017-10-25 VITALS — BP 108/84 | HR 66 | Temp 98.6°F | Resp 18 | Wt 218.0 lb

## 2017-10-25 DIAGNOSIS — D509 Iron deficiency anemia, unspecified: Secondary | ICD-10-CM | POA: Insufficient documentation

## 2017-10-25 DIAGNOSIS — K909 Intestinal malabsorption, unspecified: Secondary | ICD-10-CM | POA: Diagnosis not present

## 2017-10-25 DIAGNOSIS — K219 Gastro-esophageal reflux disease without esophagitis: Secondary | ICD-10-CM | POA: Insufficient documentation

## 2017-10-25 DIAGNOSIS — Z9884 Bariatric surgery status: Secondary | ICD-10-CM | POA: Diagnosis not present

## 2017-10-25 DIAGNOSIS — D508 Other iron deficiency anemias: Secondary | ICD-10-CM

## 2017-10-25 DIAGNOSIS — D500 Iron deficiency anemia secondary to blood loss (chronic): Secondary | ICD-10-CM

## 2017-10-25 LAB — CBC WITH DIFFERENTIAL (CANCER CENTER ONLY)
Basophils Absolute: 0 10*3/uL (ref 0.0–0.1)
Basophils Relative: 0 %
EOS PCT: 3 %
Eosinophils Absolute: 0.2 10*3/uL (ref 0.0–0.5)
HCT: 36.5 % (ref 34.8–46.6)
Hemoglobin: 12.1 g/dL (ref 11.6–15.9)
LYMPHS ABS: 2 10*3/uL (ref 0.9–3.3)
LYMPHS PCT: 39 %
MCH: 30.9 pg (ref 26.0–34.0)
MCHC: 33.2 g/dL (ref 32.0–36.0)
MCV: 93.1 fL (ref 81.0–101.0)
MONO ABS: 0.5 10*3/uL (ref 0.1–0.9)
Monocytes Relative: 10 %
Neutro Abs: 2.4 10*3/uL (ref 1.5–6.5)
Neutrophils Relative %: 48 %
PLATELETS: 255 10*3/uL (ref 145–400)
RBC: 3.92 MIL/uL (ref 3.70–5.32)
RDW: 12.9 % (ref 11.1–15.7)
WBC Count: 5.1 10*3/uL (ref 3.9–10.0)

## 2017-10-25 LAB — RETICULOCYTES
RBC.: 3.91 MIL/uL (ref 3.70–5.45)
RETIC COUNT ABSOLUTE: 27.4 10*3/uL — AB (ref 33.7–90.7)
Retic Ct Pct: 0.7 % (ref 0.7–2.1)

## 2017-10-25 LAB — FERRITIN: Ferritin: 175 ng/mL (ref 11–307)

## 2017-10-25 NOTE — Progress Notes (Signed)
Hematology and Oncology Follow Up Visit  Cymone D Ertel [id number] December 19, 1970 47 y.o. 10/25/2017   Principle Diagnosis:  Iron deficiency anemiasecondary to malabsorption (gastric sleeve 2018)  Current Therapy:   IV iron as indicated - last received inJanuary2019   Interim History:  Ms. Solivan is here today for follow-up. She is doing well but hs noticed symptoms similar to when she had H. Pylori, nausea in he morning and afternoon. Her GERD has also been more frequent and her omeprazole does not seen to last as long.  I advised she follow-up with GI for further evaluation.  She has had a few episodes of diarrhea.  No fever, chills, vomiting, cough, rash, dizziness, SOB, chest pain, palpitations, abdominal pain or changes in bladder habits. No episodes of bleeding, no bruising or petechiae. No c/o pain at this time.  No lymphadenopathy noted on exam.  She has maintained a good appetite and is staying well hydrated. Her weight is stable.   ECOG Performance Status: 1 - Symptomatic but completely ambulatory  Medications:  Allergies as of 10/25/2017      Reactions   Ciprofloxacin Hives, Swelling   Lisinopril Hives, Itching   Face swelling  Face swelling  Face swelling  Face swelling  Face swelling  Face swelling  Face swelling  Face swelling  Face swelling  Face swelling       Medication List        Accurate as of 10/25/17 10:27 AM. Always use your most recent med list.          albuterol 108 (90 Base) MCG/ACT inhaler Commonly known as:  PROVENTIL HFA;VENTOLIN HFA Inhale into the lungs.   aspirin EC 81 MG tablet Take 81 mg by mouth daily.   atorvastatin 80 MG tablet Commonly known as:  LIPITOR Take 80 mg by mouth at bedtime.   Azelastine HCl 0.15 % Soln Place into the nose.   Azelastine HCl 0.15 % Soln Place 1 spray into the nose 2 (two) times daily as needed.   BD INSULIN SYRINGE U/F 31G X 5/16" 0.3 ML Misc Generic drug:  Insulin Syringe-Needle U-100     Budesonide 90 MCG/ACT inhaler Inhale into the lungs.   carvedilol 25 MG tablet Commonly known as:  COREG Take 25 mg by mouth 2 (two) times daily with a meal.   cetirizine 10 MG tablet Commonly known as:  ZYRTEC Take by mouth daily.   diclofenac sodium 1 % Gel Commonly known as:  VOLTAREN Apply topically.   EPINEPHrine 0.3 mg/0.3 mL Soaj injection Commonly known as:  EPI-PEN Use as directed for severe allergic reaction.   esomeprazole 40 MG capsule Commonly known as:  NEXIUM TAKE 1 CAPSULE TWICE DAILY 30 MINUTES PRIOR TO BREAKFAST AND DINNER.   ezetimibe 10 MG tablet Commonly known as:  ZETIA 1 TAB(S) ONCE A DAY ORALLY 90 DAYS   fluticasone 44 MCG/ACT inhaler Commonly known as:  FLOVENT HFA INHALE 2 PUFFS EVERY DAY   folic acid 1 MG tablet Commonly known as:  FOLVITE Take 1 tablet (1 mg total) by mouth daily.   furosemide 40 MG tablet Commonly known as:  LASIX TAKE 1 TABLET BY MOUTH EVERY DAY MAY TAKE EXTRA TAB AS NEEDED FOR WEIGHT GAIN/SWELLING   gabapentin 300 MG capsule Commonly known as:  NEURONTIN Take 600 mg by mouth at bedtime.   HUMALOG MIX 75/25 KWIKPEN (75-25) 100 UNIT/ML Kwikpen Generic drug:  Insulin Lispro Prot & Lispro Over 200, 40Units   HYDROcodone-acetaminophen 7.5-325 MG tablet Commonly  known as:  NORCO Take by mouth.   ibuprofen 800 MG tablet Commonly known as:  ADVIL,MOTRIN   isosorbide mononitrate 60 MG 24 hr tablet Commonly known as:  IMDUR Take by mouth.   losartan 100 MG tablet Commonly known as:  COZAAR 100 mg daily.   metroNIDAZOLE 0.75 % vaginal gel Commonly known as:  METROGEL USE EVERY NIGHT VAGINALLY   nitroGLYCERIN 0.4 MG SL tablet Commonly known as:  NITROSTAT Place under the tongue.   Olopatadine HCl 0.2 % Soln Commonly known as:  PATADAY Place 1 drop into both eyes 1 day or 1 dose.   omeprazole 20 MG capsule Commonly known as:  PRILOSEC 20 mg 2 (two) times daily.   ONETOUCH DELICA LANCETS 33G Misc 2  (two) times daily. for testing   [facility] VERIO test strip Generic drug:  glucose blood 2 (two) times daily. for testing   sitaGLIPtin-metformin 50-500 MG tablet Commonly known as:  JANUMET Take by mouth 2 (two) times daily with a meal.   ticagrelor 90 MG Tabs tablet Commonly known as:  BRILINTA TAKE 1 TABLET (90 MG TOTAL) BY MOUTH 2 (TWO) TIMES DAILY.   tiZANidine 2 MG tablet Commonly known as:  ZANAFLEX Take by mouth once.   trimethoprim 100 MG tablet Commonly known as:  TRIMPEX Take 100 mg by mouth.   Vitamin D3 50000 units Caps 50,000 Units once a week.       Allergies:  Allergies  Allergen Reactions  . Ciprofloxacin Hives and Swelling  . Lisinopril Hives and Itching    Face swelling  Face swelling  Face swelling  Face swelling  Face swelling  Face swelling  Face swelling  Face swelling  Face swelling  Face swelling      Past Medical History, Surgical history, Social history, and Family History were reviewed and updated.  Review of Systems: All other 10 point review of systems is negative.   Physical Exam:  weight is 218 lb (98.9 kg). Her oral temperature is 98.6 F (37 C). Her blood pressure is 108/84 and her pulse is 66. Her respiration is 18 and oxygen saturation is 100%.   Wt Readings from Last 3 Encounters:  10/25/17 218 lb (98.9 kg)  08/25/17 242 lb 1.9 oz (109.8 kg)  05/26/17 270 lb (122.5 kg)    Ocular: Sclerae unicteric, pupils equal, round and reactive to light Ear-nose-throat: Oropharynx clear, dentition fair Lymphatic: No cervical, supraclavicular or axillary adenopathy Lungs no rales or rhonchi, good excursion bilaterally Heart regular rate and rhythm, no murmur appreciated Abd soft, nontender, positive bowel sounds, no liver or spleen tip palpated on exam, no fluid wave  MSK no focal spinal tenderness, no joint edema Neuro: non-focal, well-oriented, appropriate affect Breasts: Deferred   Lab Results  Component Value Date   WBC  5.1 10/25/2017   HGB 12.1 10/25/2017   HCT 36.5 10/25/2017   MCV 93.1 10/25/2017   PLT 255 10/25/2017   Lab Results  Component Value Date   FERRITIN 186 08/25/2017   IRON 64 08/25/2017   TIBC 218 (L) 08/25/2017   UIBC 153 08/25/2017   IRONPCTSAT 30 08/25/2017   Lab Results  Component Value Date   RETICCTPCT 1.7 08/25/2017   RBC 3.92 10/25/2017   No results found for: KPAFRELGTCHN, LAMBDASER, KAPLAMBRATIO No results found for: IGGSERUM, IGA, IGMSERUM No results found for: TOTALPROTELP, ALBUMINELP, A1GS, A2GS, BETS, BETA2SER, GAMS, MSPIKE, SPEI   Chemistry      Component Value Date/Time   NA 146 (H) 01/18/2017  1310   K 4.8 (H) 01/18/2017 1310   CL 107 01/18/2017 1310   CO2 27 01/18/2017 1310   BUN 21 01/18/2017 1310   CREATININE 1.4 (H) 01/18/2017 1310      Component Value Date/Time   CALCIUM 9.2 01/18/2017 1310   ALKPHOS 100 (H) 01/18/2017 1310   AST 22 01/18/2017 1310   ALT 19 01/18/2017 1310   BILITOT 0.60 01/18/2017 1310      Impression and Plan: Ms. Laprise is a very pleasant 47 yo African [place] female with iron deficiency anemia secondary to malabsorption after gastric bypass in 2018.  She is doing well on folic acid daily. We will see what her iron studies show and bring her back in for infusion if needed.  We will go ahead and plan to see her back in another 3 months for follow-up.  She will contact our office with any questions or concerns. We can certainly see her sooner if need be.   Emeline Gins, NP 7/1/201910:27 AM

## 2017-10-26 DIAGNOSIS — I2510 Atherosclerotic heart disease of native coronary artery without angina pectoris: Secondary | ICD-10-CM | POA: Diagnosis not present

## 2017-10-26 DIAGNOSIS — I10 Essential (primary) hypertension: Secondary | ICD-10-CM | POA: Diagnosis not present

## 2017-10-26 DIAGNOSIS — E7800 Pure hypercholesterolemia, unspecified: Secondary | ICD-10-CM | POA: Diagnosis not present

## 2017-10-26 DIAGNOSIS — I252 Old myocardial infarction: Secondary | ICD-10-CM | POA: Diagnosis not present

## 2017-10-26 DIAGNOSIS — I482 Chronic atrial fibrillation: Secondary | ICD-10-CM | POA: Diagnosis not present

## 2017-10-26 LAB — IRON AND TIBC
Iron: 65 ug/dL (ref 41–142)
Saturation Ratios: 27 % (ref 21–57)
TIBC: 240 ug/dL (ref 236–444)
UIBC: 178 ug/dL

## 2017-11-21 DIAGNOSIS — M791 Myalgia, unspecified site: Secondary | ICD-10-CM | POA: Diagnosis not present

## 2017-11-30 DIAGNOSIS — M533 Sacrococcygeal disorders, not elsewhere classified: Secondary | ICD-10-CM | POA: Diagnosis not present

## 2017-11-30 DIAGNOSIS — G8929 Other chronic pain: Secondary | ICD-10-CM | POA: Diagnosis not present

## 2017-11-30 DIAGNOSIS — Z79899 Other long term (current) drug therapy: Secondary | ICD-10-CM | POA: Diagnosis not present

## 2017-11-30 DIAGNOSIS — M5441 Lumbago with sciatica, right side: Secondary | ICD-10-CM | POA: Diagnosis not present

## 2017-12-03 DIAGNOSIS — M1710 Unilateral primary osteoarthritis, unspecified knee: Secondary | ICD-10-CM | POA: Diagnosis not present

## 2017-12-03 DIAGNOSIS — J45909 Unspecified asthma, uncomplicated: Secondary | ICD-10-CM | POA: Diagnosis not present

## 2017-12-03 DIAGNOSIS — E1165 Type 2 diabetes mellitus with hyperglycemia: Secondary | ICD-10-CM | POA: Diagnosis not present

## 2017-12-03 DIAGNOSIS — I10 Essential (primary) hypertension: Secondary | ICD-10-CM | POA: Diagnosis not present

## 2017-12-22 DIAGNOSIS — M791 Myalgia, unspecified site: Secondary | ICD-10-CM | POA: Diagnosis not present

## 2017-12-30 DIAGNOSIS — I10 Essential (primary) hypertension: Secondary | ICD-10-CM | POA: Diagnosis not present

## 2017-12-30 DIAGNOSIS — E1165 Type 2 diabetes mellitus with hyperglycemia: Secondary | ICD-10-CM | POA: Diagnosis not present

## 2017-12-30 DIAGNOSIS — L2089 Other atopic dermatitis: Secondary | ICD-10-CM | POA: Diagnosis not present

## 2017-12-30 DIAGNOSIS — Z0000 Encounter for general adult medical examination without abnormal findings: Secondary | ICD-10-CM | POA: Diagnosis not present

## 2018-01-18 DIAGNOSIS — H0100B Unspecified blepharitis left eye, upper and lower eyelids: Secondary | ICD-10-CM | POA: Diagnosis not present

## 2018-01-18 DIAGNOSIS — H0100A Unspecified blepharitis right eye, upper and lower eyelids: Secondary | ICD-10-CM | POA: Diagnosis not present

## 2018-01-18 DIAGNOSIS — E119 Type 2 diabetes mellitus without complications: Secondary | ICD-10-CM | POA: Diagnosis not present

## 2018-01-18 DIAGNOSIS — H43393 Other vitreous opacities, bilateral: Secondary | ICD-10-CM | POA: Diagnosis not present

## 2018-01-18 DIAGNOSIS — Z7984 Long term (current) use of oral hypoglycemic drugs: Secondary | ICD-10-CM | POA: Diagnosis not present

## 2018-01-22 DIAGNOSIS — M791 Myalgia, unspecified site: Secondary | ICD-10-CM | POA: Diagnosis not present

## 2018-01-22 DIAGNOSIS — G4733 Obstructive sleep apnea (adult) (pediatric): Secondary | ICD-10-CM | POA: Diagnosis not present

## 2018-01-30 DIAGNOSIS — Z7984 Long term (current) use of oral hypoglycemic drugs: Secondary | ICD-10-CM | POA: Diagnosis not present

## 2018-01-30 DIAGNOSIS — R001 Bradycardia, unspecified: Secondary | ICD-10-CM | POA: Diagnosis not present

## 2018-01-30 DIAGNOSIS — I97190 Other postprocedural cardiac functional disturbances following cardiac surgery: Secondary | ICD-10-CM | POA: Diagnosis not present

## 2018-01-30 DIAGNOSIS — R9431 Abnormal electrocardiogram [ECG] [EKG]: Secondary | ICD-10-CM | POA: Diagnosis not present

## 2018-01-30 DIAGNOSIS — I249 Acute ischemic heart disease, unspecified: Secondary | ICD-10-CM | POA: Diagnosis not present

## 2018-01-30 DIAGNOSIS — I209 Angina pectoris, unspecified: Secondary | ICD-10-CM | POA: Diagnosis not present

## 2018-01-30 DIAGNOSIS — I272 Pulmonary hypertension, unspecified: Secondary | ICD-10-CM | POA: Diagnosis not present

## 2018-01-30 DIAGNOSIS — I501 Left ventricular failure: Secondary | ICD-10-CM | POA: Diagnosis not present

## 2018-01-30 DIAGNOSIS — Z881 Allergy status to other antibiotic agents status: Secondary | ICD-10-CM | POA: Diagnosis not present

## 2018-01-30 DIAGNOSIS — E7849 Other hyperlipidemia: Secondary | ICD-10-CM | POA: Diagnosis not present

## 2018-01-30 DIAGNOSIS — I2582 Chronic total occlusion of coronary artery: Secondary | ICD-10-CM | POA: Diagnosis not present

## 2018-01-30 DIAGNOSIS — I10 Essential (primary) hypertension: Secondary | ICD-10-CM | POA: Diagnosis not present

## 2018-01-30 DIAGNOSIS — E611 Iron deficiency: Secondary | ICD-10-CM | POA: Diagnosis not present

## 2018-01-30 DIAGNOSIS — I420 Dilated cardiomyopathy: Secondary | ICD-10-CM | POA: Diagnosis not present

## 2018-01-30 DIAGNOSIS — G4733 Obstructive sleep apnea (adult) (pediatric): Secondary | ICD-10-CM | POA: Diagnosis not present

## 2018-01-30 DIAGNOSIS — K219 Gastro-esophageal reflux disease without esophagitis: Secondary | ICD-10-CM | POA: Diagnosis not present

## 2018-01-30 DIAGNOSIS — I252 Old myocardial infarction: Secondary | ICD-10-CM | POA: Diagnosis not present

## 2018-01-30 DIAGNOSIS — Z7902 Long term (current) use of antithrombotics/antiplatelets: Secondary | ICD-10-CM | POA: Diagnosis not present

## 2018-01-30 DIAGNOSIS — I255 Ischemic cardiomyopathy: Secondary | ICD-10-CM | POA: Diagnosis not present

## 2018-01-30 DIAGNOSIS — I25110 Atherosclerotic heart disease of native coronary artery with unstable angina pectoris: Secondary | ICD-10-CM | POA: Diagnosis not present

## 2018-01-30 DIAGNOSIS — Z888 Allergy status to other drugs, medicaments and biological substances status: Secondary | ICD-10-CM | POA: Diagnosis not present

## 2018-01-30 DIAGNOSIS — I69351 Hemiplegia and hemiparesis following cerebral infarction affecting right dominant side: Secondary | ICD-10-CM | POA: Diagnosis not present

## 2018-01-30 DIAGNOSIS — R791 Abnormal coagulation profile: Secondary | ICD-10-CM | POA: Diagnosis not present

## 2018-01-30 DIAGNOSIS — I110 Hypertensive heart disease with heart failure: Secondary | ICD-10-CM | POA: Diagnosis not present

## 2018-01-30 DIAGNOSIS — R079 Chest pain, unspecified: Secondary | ICD-10-CM | POA: Diagnosis not present

## 2018-01-30 DIAGNOSIS — Z7982 Long term (current) use of aspirin: Secondary | ICD-10-CM | POA: Diagnosis not present

## 2018-01-30 DIAGNOSIS — R748 Abnormal levels of other serum enzymes: Secondary | ICD-10-CM | POA: Diagnosis not present

## 2018-01-30 DIAGNOSIS — I21A9 Other myocardial infarction type: Secondary | ICD-10-CM | POA: Diagnosis not present

## 2018-01-30 DIAGNOSIS — I2510 Atherosclerotic heart disease of native coronary artery without angina pectoris: Secondary | ICD-10-CM | POA: Diagnosis not present

## 2018-01-30 DIAGNOSIS — K047 Periapical abscess without sinus: Secondary | ICD-10-CM | POA: Diagnosis not present

## 2018-01-30 DIAGNOSIS — Z794 Long term (current) use of insulin: Secondary | ICD-10-CM | POA: Diagnosis not present

## 2018-01-30 DIAGNOSIS — E1165 Type 2 diabetes mellitus with hyperglycemia: Secondary | ICD-10-CM | POA: Diagnosis not present

## 2018-01-30 DIAGNOSIS — E559 Vitamin D deficiency, unspecified: Secondary | ICD-10-CM | POA: Diagnosis not present

## 2018-01-30 DIAGNOSIS — I5022 Chronic systolic (congestive) heart failure: Secondary | ICD-10-CM | POA: Diagnosis not present

## 2018-01-30 DIAGNOSIS — I214 Non-ST elevation (NSTEMI) myocardial infarction: Secondary | ICD-10-CM | POA: Diagnosis not present

## 2018-01-30 DIAGNOSIS — T82867A Thrombosis of cardiac prosthetic devices, implants and grafts, initial encounter: Secondary | ICD-10-CM | POA: Diagnosis not present

## 2018-01-30 DIAGNOSIS — E119 Type 2 diabetes mellitus without complications: Secondary | ICD-10-CM | POA: Diagnosis not present

## 2018-01-30 DIAGNOSIS — I493 Ventricular premature depolarization: Secondary | ICD-10-CM | POA: Diagnosis not present

## 2018-01-30 DIAGNOSIS — E785 Hyperlipidemia, unspecified: Secondary | ICD-10-CM | POA: Diagnosis not present

## 2018-01-30 DIAGNOSIS — J4530 Mild persistent asthma, uncomplicated: Secondary | ICD-10-CM | POA: Diagnosis not present

## 2018-01-30 DIAGNOSIS — D509 Iron deficiency anemia, unspecified: Secondary | ICD-10-CM | POA: Diagnosis not present

## 2018-01-31 ENCOUNTER — Other Ambulatory Visit: Payer: Medicare Other

## 2018-01-31 ENCOUNTER — Ambulatory Visit: Payer: Medicare Other | Admitting: Family

## 2018-02-05 DIAGNOSIS — Z8673 Personal history of transient ischemic attack (TIA), and cerebral infarction without residual deficits: Secondary | ICD-10-CM | POA: Diagnosis not present

## 2018-02-05 DIAGNOSIS — I6523 Occlusion and stenosis of bilateral carotid arteries: Secondary | ICD-10-CM | POA: Diagnosis not present

## 2018-02-10 DIAGNOSIS — I5022 Chronic systolic (congestive) heart failure: Secondary | ICD-10-CM | POA: Diagnosis not present

## 2018-02-10 DIAGNOSIS — I110 Hypertensive heart disease with heart failure: Secondary | ICD-10-CM | POA: Diagnosis not present

## 2018-02-10 DIAGNOSIS — I2510 Atherosclerotic heart disease of native coronary artery without angina pectoris: Secondary | ICD-10-CM | POA: Diagnosis not present

## 2018-02-10 DIAGNOSIS — E559 Vitamin D deficiency, unspecified: Secondary | ICD-10-CM | POA: Diagnosis not present

## 2018-02-10 DIAGNOSIS — E7800 Pure hypercholesterolemia, unspecified: Secondary | ICD-10-CM | POA: Diagnosis not present

## 2018-02-14 ENCOUNTER — Other Ambulatory Visit: Payer: Self-pay

## 2018-02-14 ENCOUNTER — Inpatient Hospital Stay (HOSPITAL_BASED_OUTPATIENT_CLINIC_OR_DEPARTMENT_OTHER): Payer: Medicare Other | Admitting: Family

## 2018-02-14 ENCOUNTER — Inpatient Hospital Stay: Payer: Medicare Other | Attending: Family

## 2018-02-14 ENCOUNTER — Encounter: Payer: Self-pay | Admitting: Family

## 2018-02-14 VITALS — BP 130/76 | HR 65 | Temp 98.8°F | Resp 18 | Wt 209.0 lb

## 2018-02-14 DIAGNOSIS — I2510 Atherosclerotic heart disease of native coronary artery without angina pectoris: Secondary | ICD-10-CM | POA: Diagnosis not present

## 2018-02-14 DIAGNOSIS — K909 Intestinal malabsorption, unspecified: Secondary | ICD-10-CM | POA: Insufficient documentation

## 2018-02-14 DIAGNOSIS — D500 Iron deficiency anemia secondary to blood loss (chronic): Secondary | ICD-10-CM | POA: Insufficient documentation

## 2018-02-14 DIAGNOSIS — I252 Old myocardial infarction: Secondary | ICD-10-CM | POA: Insufficient documentation

## 2018-02-14 DIAGNOSIS — E1165 Type 2 diabetes mellitus with hyperglycemia: Secondary | ICD-10-CM | POA: Diagnosis not present

## 2018-02-14 DIAGNOSIS — Z9884 Bariatric surgery status: Secondary | ICD-10-CM | POA: Insufficient documentation

## 2018-02-14 DIAGNOSIS — I10 Essential (primary) hypertension: Secondary | ICD-10-CM | POA: Diagnosis not present

## 2018-02-14 DIAGNOSIS — D508 Other iron deficiency anemias: Secondary | ICD-10-CM

## 2018-02-14 DIAGNOSIS — J45909 Unspecified asthma, uncomplicated: Secondary | ICD-10-CM | POA: Diagnosis not present

## 2018-02-14 DIAGNOSIS — E785 Hyperlipidemia, unspecified: Secondary | ICD-10-CM | POA: Diagnosis not present

## 2018-02-14 LAB — CBC WITH DIFFERENTIAL (CANCER CENTER ONLY)
Abs Immature Granulocytes: 0.03 10*3/uL (ref 0.00–0.07)
BASOS ABS: 0 10*3/uL (ref 0.0–0.1)
Basophils Relative: 0 %
EOS ABS: 0.2 10*3/uL (ref 0.0–0.5)
EOS PCT: 2 %
HCT: 29.3 % — ABNORMAL LOW (ref 36.0–46.0)
Hemoglobin: 9.4 g/dL — ABNORMAL LOW (ref 12.0–15.0)
Immature Granulocytes: 0 %
Lymphocytes Relative: 17 %
Lymphs Abs: 1.4 10*3/uL (ref 0.7–4.0)
MCH: 30.7 pg (ref 26.0–34.0)
MCHC: 32.1 g/dL (ref 30.0–36.0)
MCV: 95.8 fL (ref 80.0–100.0)
MONO ABS: 0.7 10*3/uL (ref 0.1–1.0)
Monocytes Relative: 8 %
NRBC: 0 % (ref 0.0–0.2)
Neutro Abs: 6.3 10*3/uL (ref 1.7–7.7)
Neutrophils Relative %: 73 %
Platelet Count: 273 10*3/uL (ref 150–400)
RBC: 3.06 MIL/uL — ABNORMAL LOW (ref 3.87–5.11)
RDW: 12.9 % (ref 11.5–15.5)
WBC: 8.7 10*3/uL (ref 4.0–10.5)

## 2018-02-14 LAB — RETICULOCYTES
Immature Retic Fract: 8.7 % (ref 2.3–15.9)
RBC.: 3.06 MIL/uL — AB (ref 3.87–5.11)
Retic Count, Absolute: 41.6 10*3/uL (ref 19.0–186.0)
Retic Ct Pct: 1.4 % (ref 0.4–3.1)

## 2018-02-14 NOTE — Progress Notes (Signed)
Hematology and Oncology Follow Up Visit  Kimberly Ramos [id number] 1970/08/25 47 y.o. 02/14/2018   Principle Diagnosis:  Iron deficiencyanemiasecondary to malabsorption (gastric sleeve 2018)  Current Therapy:   IV iron as indicated - last received inJanuary2019   Interim History: Kimberly Ramos is here today for follow-up. She had a NSTEMI earlier this month with heart cath and new stent placed in the LAD. She is wearing a LifeVest and follows up with her cardiologist next week. She states that her heart is not strong enough for surgery.  She will stop her aspirin later this week and is doing well on Brilinta and Eliquis. No episodes of bleeding, no bruising or petechiae.  Her Hgb is down to 9.4. Iron studies are pending.  She is symptomatic with fatigue, weakness, chills and chewing ice.  She has not noted any episodes of bleeding, no bruising or petechiae.  She wears her CPAP at night.  No fever, chills, n/v, cough, rash, dizziness, SOB, chest pain, palpitations, abdominal pain or changes in bowel or bladder habits.  No swelling, tenderness, numbness or tingling in her extremities.  No lymphadenopathy noted on exam.  She has a good appetite and eats small meals through the day. She is staying well hydrated. Her weight is down 9 lbs in 3 months.   ECOG Performance Status: 1 - Symptomatic but completely ambulatory  Medications:  Allergies as of 02/14/2018      Reactions   Ciprofloxacin Hives, Swelling   Lisinopril Hives, Itching   Face swelling  Face swelling  Face swelling  Face swelling  Face swelling  Face swelling  Face swelling  Face swelling  Face swelling  Face swelling       Medication List        Accurate as of 02/14/18  3:43 PM. Always use your most recent med list.          albuterol 108 (90 Base) MCG/ACT inhaler Commonly known as:  PROVENTIL HFA;VENTOLIN HFA Inhale into the lungs.   apixaban 5 MG Tabs tablet Commonly known as:  ELIQUIS Take 5 mg by  mouth 2 (two) times daily.   aspirin EC 81 MG tablet Take 81 mg by mouth daily.   atorvastatin 80 MG tablet Commonly known as:  LIPITOR Take 80 mg by mouth at bedtime.   Azelastine HCl 0.15 % Soln Place 1 spray into the nose 2 (two) times daily as needed.   Budesonide 90 MCG/ACT inhaler Inhale into the lungs.   carvedilol 3.125 MG tablet Commonly known as:  COREG Take 3.125 mg by mouth 2 (two) times daily.   cetirizine 10 MG tablet Commonly known as:  ZYRTEC Take by mouth daily.   chlorhexidine 0.12 % solution Commonly known as:  PERIDEX RINSE WITH (1 CAPFUL) FOR 30 SECONDS AM AND PM AFTER BRUSHING SPIT OUT DO NOT SWALLOW   CLEVER CHOICE TENS UNIT Devi TENS Unit (Flex IT or Flex MT Plus) and Supplies (Dx: Lumbar radiculopathy [M54.16])  .   diclofenac sodium 1 % Gel Commonly known as:  VOLTAREN Apply topically.   EPINEPHrine 0.3 mg/0.3 mL Soaj injection Commonly known as:  EPI-PEN Use as directed for severe allergic reaction.   esomeprazole 40 MG capsule Commonly known as:  NEXIUM TAKE 1 CAPSULE TWICE DAILY 30 MINUTES PRIOR TO BREAKFAST AND DINNER.   ferrous sulfate 325 (65 FE) MG EC tablet Take 325 mg by mouth daily.   fluticasone 44 MCG/ACT inhaler Commonly known as:  FLOVENT HFA INHALE 2 PUFFS  EVERY DAY   folic acid 1 MG tablet Commonly known as:  FOLVITE Take 1 tablet (1 mg total) by mouth daily.   furosemide 40 MG tablet Commonly known as:  LASIX TAKE 1 TABLET BY MOUTH EVERY DAY MAY TAKE EXTRA TAB AS NEEDED FOR WEIGHT GAIN/SWELLING   furosemide 40 MG tablet Commonly known as:  LASIX Take 40 mg by mouth as needed.   gabapentin 300 MG capsule Commonly known as:  NEURONTIN Take 600 mg by mouth at bedtime.   HYDROcodone-acetaminophen 7.5-325 MG tablet Commonly known as:  NORCO Take by mouth.   ibuprofen 800 MG tablet Commonly known as:  ADVIL,MOTRIN   losartan 25 MG tablet Commonly known as:  COZAAR Take 50 mg by mouth daily.     metroNIDAZOLE 0.75 % vaginal gel Commonly known as:  METROGEL USE EVERY NIGHT VAGINALLY   nitroGLYCERIN 0.4 MG SL tablet Commonly known as:  NITROSTAT Place under the tongue.   Olopatadine HCl 0.2 % Soln Place 1 drop into both eyes 1 day or 1 dose.   ONETOUCH VERIO test strip Generic drug:  glucose blood 2 (two) times daily. for testing   sitaGLIPtin-metformin 50-500 MG tablet Commonly known as:  JANUMET Take by mouth 2 (two) times daily with a meal.   ticagrelor 90 MG Tabs tablet Commonly known as:  BRILINTA TAKE 1 TABLET (90 MG TOTAL) BY MOUTH 2 (TWO) TIMES DAILY.   tiZANidine 2 MG tablet Commonly known as:  ZANAFLEX Take by mouth once.   Vitamin D3 50000 units Caps 50,000 Units once a week.       Allergies:  Allergies  Allergen Reactions  . Ciprofloxacin Hives and Swelling  . Lisinopril Hives and Itching    Face swelling  Face swelling  Face swelling  Face swelling  Face swelling  Face swelling  Face swelling  Face swelling  Face swelling  Face swelling      Past Medical History, Surgical history, Social history, and Family History were reviewed and updated.  Review of Systems: All other 10 point review of systems is negative.   Physical Exam:  weight is 209 lb (94.8 kg). Her oral temperature is 98.8 F (37.1 C). Her blood pressure is 130/76 and her pulse is 65. Her respiration is 18 and oxygen saturation is 100%.   Wt Readings from Last 3 Encounters:  02/14/18 209 lb (94.8 kg)  10/25/17 218 lb (98.9 kg)  08/25/17 242 lb 1.9 oz (109.8 kg)    Ocular: Sclerae unicteric, pupils equal, round and reactive to light Ear-nose-throat: Oropharynx clear, dentition fair Lymphatic: No cervical, supraclavicular or axillary adenopathy Lungs no rales or rhonchi, good excursion bilaterally Heart regular rate and rhythm, no murmur appreciated Abd soft, nontender, positive bowel sounds, no liver or spleen tip palpated on exam, no fluid wave  MSK no focal  spinal tenderness, no joint edema Neuro: non-focal, well-oriented, appropriate affect Breasts: Deferred   Lab Results  Component Value Date   WBC 8.7 02/14/2018   HGB 9.4 (L) 02/14/2018   HCT 29.3 (L) 02/14/2018   MCV 95.8 02/14/2018   PLT 273 02/14/2018   Lab Results  Component Value Date   FERRITIN 175 10/25/2017   IRON 65 10/25/2017   TIBC 240 10/25/2017   UIBC 178 10/25/2017   IRONPCTSAT 27 10/25/2017   Lab Results  Component Value Date   RETICCTPCT 1.4 02/14/2018   RBC 3.06 (L) 02/14/2018   RBC 3.06 (L) 02/14/2018   No results found for: KPAFRELGTCHN, LAMBDASER, KAPLAMBRATIO  No results found for: IGGSERUM, IGA, IGMSERUM No results found for: TOTALPROTELP, ALBUMINELP, A1GS, A2GS, BETS, BETA2SER, GAMS, MSPIKE, SPEI   Chemistry      Component Value Date/Time   NA 146 (H) 01/18/2017 1310   K 4.8 (H) 01/18/2017 1310   CL 107 01/18/2017 1310   CO2 27 01/18/2017 1310   BUN 21 01/18/2017 1310   CREATININE 1.4 (H) 01/18/2017 1310      Component Value Date/Time   CALCIUM 9.2 01/18/2017 1310   ALKPHOS 100 (H) 01/18/2017 1310   AST 22 01/18/2017 1310   ALT 19 01/18/2017 1310   BILITOT 0.60 01/18/2017 1310      Impression and Plan: Kimberly Ramos is a very pleasant 47 yo African American female with iron deficiency anemia secondary to malabsorption after gastric bypass in 2018.  Unfortunately, she had another NSTEMI earlier this month. She was stented and is currently wearing a LifeVest. She states that she is not a candidate for surgery at this time.  We will see what her iron studies show and bring her back in for an infusion if needed.  We will go ahead and plan to see her back in 6 weeks for follow-up.  She will contact our office with any questions or concerns. We can certainly see her sooner if need be.   Emeline Gins, NP 10/21/20193:43 PM

## 2018-02-15 LAB — FERRITIN: FERRITIN: 115 ng/mL (ref 11–307)

## 2018-02-15 LAB — IRON AND TIBC
IRON: 39 ug/dL — AB (ref 41–142)
Saturation Ratios: 19 % — ABNORMAL LOW (ref 21–57)
TIBC: 205 ug/dL — ABNORMAL LOW (ref 236–444)
UIBC: 166 ug/dL

## 2018-02-17 ENCOUNTER — Inpatient Hospital Stay: Payer: Medicare Other

## 2018-02-17 VITALS — BP 126/79 | HR 77 | Temp 98.9°F | Resp 20

## 2018-02-17 DIAGNOSIS — D500 Iron deficiency anemia secondary to blood loss (chronic): Secondary | ICD-10-CM | POA: Diagnosis not present

## 2018-02-17 DIAGNOSIS — I252 Old myocardial infarction: Secondary | ICD-10-CM | POA: Diagnosis not present

## 2018-02-17 DIAGNOSIS — K909 Intestinal malabsorption, unspecified: Secondary | ICD-10-CM | POA: Diagnosis not present

## 2018-02-17 DIAGNOSIS — Z9884 Bariatric surgery status: Secondary | ICD-10-CM | POA: Diagnosis not present

## 2018-02-17 MED ORDER — SODIUM CHLORIDE 0.9 % IV SOLN
INTRAVENOUS | Status: DC
  Administered 2018-02-17: 15:00:00 via INTRAVENOUS
  Filled 2018-02-17: qty 250

## 2018-02-17 MED ORDER — SODIUM CHLORIDE 0.9 % IV SOLN
510.0000 mg | Freq: Once | INTRAVENOUS | Status: AC
  Administered 2018-02-17: 510 mg via INTRAVENOUS
  Filled 2018-02-17: qty 17

## 2018-02-17 NOTE — Patient Instructions (Signed)

## 2018-02-21 DIAGNOSIS — M791 Myalgia, unspecified site: Secondary | ICD-10-CM | POA: Diagnosis not present

## 2018-02-24 DIAGNOSIS — E785 Hyperlipidemia, unspecified: Secondary | ICD-10-CM | POA: Diagnosis not present

## 2018-02-24 DIAGNOSIS — I5022 Chronic systolic (congestive) heart failure: Secondary | ICD-10-CM | POA: Diagnosis not present

## 2018-02-24 DIAGNOSIS — I110 Hypertensive heart disease with heart failure: Secondary | ICD-10-CM | POA: Diagnosis not present

## 2018-02-24 DIAGNOSIS — Z09 Encounter for follow-up examination after completed treatment for conditions other than malignant neoplasm: Secondary | ICD-10-CM | POA: Diagnosis not present

## 2018-02-24 DIAGNOSIS — Z9989 Dependence on other enabling machines and devices: Secondary | ICD-10-CM | POA: Diagnosis not present

## 2018-02-24 DIAGNOSIS — E559 Vitamin D deficiency, unspecified: Secondary | ICD-10-CM | POA: Diagnosis not present

## 2018-02-24 DIAGNOSIS — Z9884 Bariatric surgery status: Secondary | ICD-10-CM | POA: Diagnosis not present

## 2018-02-24 DIAGNOSIS — G4733 Obstructive sleep apnea (adult) (pediatric): Secondary | ICD-10-CM | POA: Diagnosis not present

## 2018-02-24 DIAGNOSIS — Z8673 Personal history of transient ischemic attack (TIA), and cerebral infarction without residual deficits: Secondary | ICD-10-CM | POA: Diagnosis not present

## 2018-02-24 DIAGNOSIS — I6529 Occlusion and stenosis of unspecified carotid artery: Secondary | ICD-10-CM | POA: Diagnosis not present

## 2018-02-24 DIAGNOSIS — Z87891 Personal history of nicotine dependence: Secondary | ICD-10-CM | POA: Diagnosis not present

## 2018-02-24 DIAGNOSIS — D509 Iron deficiency anemia, unspecified: Secondary | ICD-10-CM | POA: Diagnosis not present

## 2018-02-24 DIAGNOSIS — E119 Type 2 diabetes mellitus without complications: Secondary | ICD-10-CM | POA: Diagnosis not present

## 2018-02-24 DIAGNOSIS — J4530 Mild persistent asthma, uncomplicated: Secondary | ICD-10-CM | POA: Diagnosis not present

## 2018-02-24 DIAGNOSIS — I2510 Atherosclerotic heart disease of native coronary artery without angina pectoris: Secondary | ICD-10-CM | POA: Diagnosis not present

## 2018-02-24 DIAGNOSIS — Z7984 Long term (current) use of oral hypoglycemic drugs: Secondary | ICD-10-CM | POA: Diagnosis not present

## 2018-03-01 DIAGNOSIS — E1165 Type 2 diabetes mellitus with hyperglycemia: Secondary | ICD-10-CM | POA: Diagnosis not present

## 2018-03-01 DIAGNOSIS — E785 Hyperlipidemia, unspecified: Secondary | ICD-10-CM | POA: Diagnosis not present

## 2018-03-01 DIAGNOSIS — I10 Essential (primary) hypertension: Secondary | ICD-10-CM | POA: Diagnosis not present

## 2018-03-01 DIAGNOSIS — J45909 Unspecified asthma, uncomplicated: Secondary | ICD-10-CM | POA: Diagnosis not present

## 2018-03-02 DIAGNOSIS — M533 Sacrococcygeal disorders, not elsewhere classified: Secondary | ICD-10-CM | POA: Diagnosis not present

## 2018-03-02 DIAGNOSIS — M545 Low back pain: Secondary | ICD-10-CM | POA: Diagnosis not present

## 2018-03-02 DIAGNOSIS — Z79899 Other long term (current) drug therapy: Secondary | ICD-10-CM | POA: Diagnosis not present

## 2018-03-07 DIAGNOSIS — I420 Dilated cardiomyopathy: Secondary | ICD-10-CM | POA: Diagnosis not present

## 2018-03-07 DIAGNOSIS — N183 Chronic kidney disease, stage 3 (moderate): Secondary | ICD-10-CM | POA: Diagnosis not present

## 2018-03-07 DIAGNOSIS — J45909 Unspecified asthma, uncomplicated: Secondary | ICD-10-CM | POA: Diagnosis not present

## 2018-03-07 DIAGNOSIS — I10 Essential (primary) hypertension: Secondary | ICD-10-CM | POA: Diagnosis not present

## 2018-03-07 DIAGNOSIS — I214 Non-ST elevation (NSTEMI) myocardial infarction: Secondary | ICD-10-CM | POA: Diagnosis not present

## 2018-03-07 DIAGNOSIS — E1165 Type 2 diabetes mellitus with hyperglycemia: Secondary | ICD-10-CM | POA: Diagnosis not present

## 2018-03-07 DIAGNOSIS — Z23 Encounter for immunization: Secondary | ICD-10-CM | POA: Diagnosis not present

## 2018-03-07 DIAGNOSIS — I2510 Atherosclerotic heart disease of native coronary artery without angina pectoris: Secondary | ICD-10-CM | POA: Diagnosis not present

## 2018-03-07 DIAGNOSIS — I252 Old myocardial infarction: Secondary | ICD-10-CM | POA: Diagnosis not present

## 2018-03-14 ENCOUNTER — Other Ambulatory Visit: Payer: Self-pay | Admitting: Family

## 2018-03-14 DIAGNOSIS — I69351 Hemiplegia and hemiparesis following cerebral infarction affecting right dominant side: Secondary | ICD-10-CM | POA: Diagnosis not present

## 2018-03-14 DIAGNOSIS — E7211 Homocystinuria: Secondary | ICD-10-CM | POA: Diagnosis not present

## 2018-03-14 DIAGNOSIS — Z7984 Long term (current) use of oral hypoglycemic drugs: Secondary | ICD-10-CM | POA: Diagnosis not present

## 2018-03-14 DIAGNOSIS — I5022 Chronic systolic (congestive) heart failure: Secondary | ICD-10-CM | POA: Diagnosis not present

## 2018-03-14 DIAGNOSIS — T82867D Thrombosis of cardiac prosthetic devices, implants and grafts, subsequent encounter: Secondary | ICD-10-CM | POA: Diagnosis not present

## 2018-03-14 DIAGNOSIS — E785 Hyperlipidemia, unspecified: Secondary | ICD-10-CM | POA: Diagnosis not present

## 2018-03-14 DIAGNOSIS — Z9989 Dependence on other enabling machines and devices: Secondary | ICD-10-CM | POA: Diagnosis not present

## 2018-03-14 DIAGNOSIS — G4733 Obstructive sleep apnea (adult) (pediatric): Secondary | ICD-10-CM | POA: Diagnosis not present

## 2018-03-14 DIAGNOSIS — I6523 Occlusion and stenosis of bilateral carotid arteries: Secondary | ICD-10-CM | POA: Diagnosis not present

## 2018-03-14 DIAGNOSIS — E559 Vitamin D deficiency, unspecified: Secondary | ICD-10-CM | POA: Diagnosis not present

## 2018-03-14 DIAGNOSIS — D509 Iron deficiency anemia, unspecified: Secondary | ICD-10-CM | POA: Diagnosis not present

## 2018-03-14 DIAGNOSIS — I110 Hypertensive heart disease with heart failure: Secondary | ICD-10-CM | POA: Diagnosis not present

## 2018-03-14 DIAGNOSIS — E119 Type 2 diabetes mellitus without complications: Secondary | ICD-10-CM | POA: Diagnosis not present

## 2018-03-14 DIAGNOSIS — Z9884 Bariatric surgery status: Secondary | ICD-10-CM | POA: Diagnosis not present

## 2018-03-14 DIAGNOSIS — Z9861 Coronary angioplasty status: Secondary | ICD-10-CM | POA: Diagnosis not present

## 2018-03-14 DIAGNOSIS — R7889 Finding of other specified substances, not normally found in blood: Secondary | ICD-10-CM | POA: Diagnosis not present

## 2018-03-14 DIAGNOSIS — I2510 Atherosclerotic heart disease of native coronary artery without angina pectoris: Secondary | ICD-10-CM | POA: Diagnosis not present

## 2018-03-24 DIAGNOSIS — M791 Myalgia, unspecified site: Secondary | ICD-10-CM | POA: Diagnosis not present

## 2018-03-29 ENCOUNTER — Other Ambulatory Visit: Payer: Self-pay

## 2018-03-29 ENCOUNTER — Inpatient Hospital Stay: Payer: Medicare Other

## 2018-03-29 ENCOUNTER — Telehealth: Payer: Self-pay

## 2018-03-29 ENCOUNTER — Inpatient Hospital Stay: Payer: Medicare Other | Attending: Family | Admitting: Family

## 2018-03-29 VITALS — BP 120/74 | HR 72 | Temp 98.2°F | Resp 20 | Wt 194.2 lb

## 2018-03-29 DIAGNOSIS — D509 Iron deficiency anemia, unspecified: Secondary | ICD-10-CM | POA: Diagnosis not present

## 2018-03-29 DIAGNOSIS — Z9884 Bariatric surgery status: Secondary | ICD-10-CM | POA: Insufficient documentation

## 2018-03-29 DIAGNOSIS — Z79899 Other long term (current) drug therapy: Secondary | ICD-10-CM | POA: Insufficient documentation

## 2018-03-29 DIAGNOSIS — D508 Other iron deficiency anemias: Secondary | ICD-10-CM

## 2018-03-29 DIAGNOSIS — K909 Intestinal malabsorption, unspecified: Secondary | ICD-10-CM | POA: Insufficient documentation

## 2018-03-29 DIAGNOSIS — D500 Iron deficiency anemia secondary to blood loss (chronic): Secondary | ICD-10-CM

## 2018-03-29 DIAGNOSIS — R5383 Other fatigue: Secondary | ICD-10-CM

## 2018-03-29 DIAGNOSIS — I214 Non-ST elevation (NSTEMI) myocardial infarction: Secondary | ICD-10-CM | POA: Diagnosis not present

## 2018-03-29 LAB — CBC WITH DIFFERENTIAL (CANCER CENTER ONLY)
Abs Immature Granulocytes: 0.02 10*3/uL (ref 0.00–0.07)
BASOS ABS: 0 10*3/uL (ref 0.0–0.1)
Basophils Relative: 0 %
Eosinophils Absolute: 0.2 10*3/uL (ref 0.0–0.5)
Eosinophils Relative: 3 %
HCT: 34.6 % — ABNORMAL LOW (ref 36.0–46.0)
Hemoglobin: 10.9 g/dL — ABNORMAL LOW (ref 12.0–15.0)
Immature Granulocytes: 0 %
Lymphocytes Relative: 26 %
Lymphs Abs: 1.6 10*3/uL (ref 0.7–4.0)
MCH: 31.1 pg (ref 26.0–34.0)
MCHC: 31.5 g/dL (ref 30.0–36.0)
MCV: 98.6 fL (ref 80.0–100.0)
Monocytes Absolute: 0.5 10*3/uL (ref 0.1–1.0)
Monocytes Relative: 8 %
NRBC: 0 % (ref 0.0–0.2)
Neutro Abs: 3.8 10*3/uL (ref 1.7–7.7)
Neutrophils Relative %: 63 %
Platelet Count: 226 10*3/uL (ref 150–400)
RBC: 3.51 MIL/uL — ABNORMAL LOW (ref 3.87–5.11)
RDW: 12.6 % (ref 11.5–15.5)
WBC Count: 6 10*3/uL (ref 4.0–10.5)

## 2018-03-29 LAB — RETICULOCYTES
Immature Retic Fract: 6 % (ref 2.3–15.9)
RBC.: 3.51 MIL/uL — ABNORMAL LOW (ref 3.87–5.11)
Retic Count, Absolute: 42.8 10*3/uL (ref 19.0–186.0)
Retic Ct Pct: 1.2 % (ref 0.4–3.1)

## 2018-03-29 NOTE — Telephone Encounter (Signed)
Appts scheduled avs/calendar printed per 12/3 los

## 2018-03-29 NOTE — Progress Notes (Addendum)
Hematology and Oncology Follow Up Visit  Kimberly Ramos [id number] January 15, 1971 47 y.o. 03/29/2018   Principle Diagnosis:  Iron deficiencyanemiasecondary to malabsorption (gastric sleeve 2018) Hyper homocystinemia   Current Therapy:   IV iron as indicated - last received inJanuary2019 Folic acid 1 mg PO daily   Interim History:  Kimberly Ramos is here today for follow-up. She is a little fatigued today after cardia rehab this morning. She is going 3 days a week.  She has not noted any episodes of bleeding, no bruising or petechiae. She is still taking Brilinta and Eliquis as prescribed.  She was recently seen by cardiology and had her medications adjusted. She is still wearing a LifeVest.  She has occasional chills.  Homocystine level in late October was 17.3. She verbalized that she is taking her folic acid daily as prescribed.  No fever, n/v, cough, rash, dizziness, SOB, chest pain, palpitations, abdominal pain or changes in bowel or bladder habits.  No swelling, tenderness, numbness or tingling in her extremities.  No lymphadenopathy noted on exam.  She has maintained a good appetite and is staying well hydrated. Her weight is stable.   ECOG Performance Status: 1 - Symptomatic but completely ambulatory  Medications:  Allergies as of 03/29/2018      Reactions   Ciprofloxacin Hives, Swelling   Lisinopril Hives, Itching   Face swelling  Face swelling  Face swelling  Face swelling  Face swelling  Face swelling  Face swelling  Face swelling  Face swelling  Face swelling       Medication List        Accurate as of 03/29/18  1:50 PM. Always use your most recent med list.          albuterol 108 (90 Base) MCG/ACT inhaler Commonly known as:  PROVENTIL HFA;VENTOLIN HFA Inhale into the lungs.   albuterol 1.25 MG/3ML nebulizer solution Commonly known as:  ACCUNEB PLEASE SEE ATTACHED FOR DETAILED DIRECTIONS   apixaban 5 MG Tabs tablet Commonly known as:  ELIQUIS Take 5 mg  by mouth 2 (two) times daily.   atorvastatin 80 MG tablet Commonly known as:  LIPITOR Take 80 mg by mouth at bedtime.   Azelastine HCl 0.15 % Soln Place 1 spray into the nose 2 (two) times daily as needed.   Budesonide 90 MCG/ACT inhaler Inhale into the lungs.   carvedilol 3.125 MG tablet Commonly known as:  COREG Take 3.125 mg by mouth 2 (two) times daily.   carvedilol 6.25 MG tablet Commonly known as:  COREG Take 6.25 mg by mouth 2 (two) times daily.   cetirizine 10 MG tablet Commonly known as:  ZYRTEC Take by mouth daily.   CLEVER CHOICE TENS UNIT Devi TENS Unit (Flex IT or Flex MT Plus) and Supplies (Dx: Lumbar radiculopathy [M54.16])  .   diclofenac sodium 1 % Gel Commonly known as:  VOLTAREN Apply topically.   EPINEPHrine 0.3 mg/0.3 mL Soaj injection Commonly known as:  EPI-PEN Use as directed for severe allergic reaction.   esomeprazole 40 MG capsule Commonly known as:  NEXIUM TAKE 1 CAPSULE TWICE DAILY 30 MINUTES PRIOR TO BREAKFAST AND DINNER.   ferrous sulfate 325 (65 FE) MG EC tablet Take 325 mg by mouth daily.   fluticasone 44 MCG/ACT inhaler Commonly known as:  FLOVENT HFA INHALE 2 PUFFS EVERY DAY   folic acid 1 MG tablet Commonly known as:  FOLVITE Take 1 tablet (1 mg total) by mouth daily.   furosemide 40 MG tablet Commonly known  as:  LASIX Take 40 mg by mouth as needed.   gabapentin 300 MG capsule Commonly known as:  NEURONTIN Take 600 mg by mouth at bedtime.   HYDROcodone-acetaminophen 7.5-325 MG tablet Commonly known as:  NORCO Take by mouth.   losartan 25 MG tablet Commonly known as:  COZAAR Take 50 mg by mouth daily.   losartan 50 MG tablet Commonly known as:  COZAAR   magnesium oxide 400 MG tablet Commonly known as:  MAG-OX Take by mouth.   metroNIDAZOLE 0.75 % vaginal gel Commonly known as:  METROGEL USE EVERY NIGHT VAGINALLY   nitroGLYCERIN 0.4 MG SL tablet Commonly known as:  NITROSTAT Place under the tongue.     Olopatadine HCl 0.2 % Soln Place 1 drop into both eyes 1 day or 1 dose.   ONETOUCH VERIO test strip Generic drug:  glucose blood 2 (two) times daily. for testing   potassium chloride 10 MEQ CR capsule Commonly known as:  MICRO-K Take by mouth.   sacubitril-valsartan 49-51 MG Commonly known as:  ENTRESTO Take by mouth.   sitaGLIPtin-metformin 50-500 MG tablet Commonly known as:  JANUMET Take by mouth 2 (two) times daily with a meal.   ticagrelor 90 MG Tabs tablet Commonly known as:  BRILINTA TAKE 1 TABLET (90 MG TOTAL) BY MOUTH 2 (TWO) TIMES DAILY.   tiZANidine 2 MG tablet Commonly known as:  ZANAFLEX Take by mouth once.   Vitamin D3 1.25 MG (50000 UT) Caps 50,000 Units once a week.       Allergies:  Allergies  Allergen Reactions  . Ciprofloxacin Hives and Swelling  . Lisinopril Hives and Itching    Face swelling  Face swelling  Face swelling  Face swelling  Face swelling  Face swelling  Face swelling  Face swelling  Face swelling  Face swelling      Past Medical History, Surgical history, Social history, and Family History were reviewed and updated.  Review of Systems: All other 10 point review of systems is negative.   Physical Exam:  weight is 194 lb 4 oz (88.1 kg). Her oral temperature is 98.2 F (36.8 C). Her blood pressure is 120/74 and her pulse is 72. Her respiration is 20 and oxygen saturation is 100%.   Wt Readings from Last 3 Encounters:  03/29/18 194 lb 4 oz (88.1 kg)  02/14/18 209 lb (94.8 kg)  10/25/17 218 lb (98.9 kg)    Ocular: Sclerae unicteric, pupils equal, round and reactive to light Ear-nose-throat: Oropharynx clear, dentition fair Lymphatic: No cervical, supraclavicular or axillary adenopathy Lungs no rales or rhonchi, good excursion bilaterally Heart regular rate and rhythm, no murmur appreciated Abd soft, nontender, positive bowel sounds, no liver or spleen tip palpated on exam, no fluid wave  MSK no focal spinal  tenderness, no joint edema Neuro: non-focal, well-oriented, appropriate affect Breasts: Deferred   Lab Results  Component Value Date   WBC 6.0 03/29/2018   HGB 10.9 (L) 03/29/2018   HCT 34.6 (L) 03/29/2018   MCV 98.6 03/29/2018   PLT 226 03/29/2018   Lab Results  Component Value Date   FERRITIN 115 02/14/2018   IRON 39 (L) 02/14/2018   TIBC 205 (L) 02/14/2018   UIBC 166 02/14/2018   IRONPCTSAT 19 (L) 02/14/2018   Lab Results  Component Value Date   RETICCTPCT 1.2 03/29/2018   RBC 3.51 (L) 03/29/2018   RBC 3.51 (L) 03/29/2018   No results found for: KPAFRELGTCHN, LAMBDASER, KAPLAMBRATIO No results found for: IGGSERUM, IGA, IGMSERUM  No results found for: TOTALPROTELP, ALBUMINELP, A1GS, A2GS, BETS, BETA2SER, GAMS, MSPIKE, SPEI   Chemistry      Component Value Date/Time   NA 146 (H) 01/18/2017 1310   K 4.8 (H) 01/18/2017 1310   CL 107 01/18/2017 1310   CO2 27 01/18/2017 1310   BUN 21 01/18/2017 1310   CREATININE 1.4 (H) 01/18/2017 1310      Component Value Date/Time   CALCIUM 9.2 01/18/2017 1310   ALKPHOS 100 (H) 01/18/2017 1310   AST 22 01/18/2017 1310   ALT 19 01/18/2017 1310   BILITOT 0.60 01/18/2017 1310       Impression and Plan: Kimberly Ramos is a very pleasant 47 yo African American female with iron deficiency anemia secondary to malabsorption after gastric bypass in 2018.  She feeling better since her iron infusion in October but is still having some fatigue after cardiac rehab.  We will see what her iron studies show and bring her back in for infusion if needed.   She will continue her folic acid daily for hyper homocystinemia. We will plan to see her back in another 2 months.  She will contact our office with any questions or concerns. We can certainly see her sooner if need be.   Emeline Gins, NP 12/3/20191:50 PM

## 2018-03-30 DIAGNOSIS — I214 Non-ST elevation (NSTEMI) myocardial infarction: Secondary | ICD-10-CM | POA: Diagnosis not present

## 2018-03-30 LAB — IRON AND TIBC
Iron: 51 ug/dL (ref 41–142)
Saturation Ratios: 23 % (ref 21–57)
TIBC: 220 ug/dL — ABNORMAL LOW (ref 236–444)
UIBC: 169 ug/dL (ref 120–384)

## 2018-03-30 LAB — FERRITIN: Ferritin: 230 ng/mL (ref 11–307)

## 2018-04-04 DIAGNOSIS — E782 Mixed hyperlipidemia: Secondary | ICD-10-CM | POA: Diagnosis not present

## 2018-04-04 DIAGNOSIS — I214 Non-ST elevation (NSTEMI) myocardial infarction: Secondary | ICD-10-CM | POA: Diagnosis not present

## 2018-04-04 DIAGNOSIS — E1165 Type 2 diabetes mellitus with hyperglycemia: Secondary | ICD-10-CM | POA: Diagnosis not present

## 2018-04-04 DIAGNOSIS — I2510 Atherosclerotic heart disease of native coronary artery without angina pectoris: Secondary | ICD-10-CM | POA: Diagnosis not present

## 2018-04-04 DIAGNOSIS — I10 Essential (primary) hypertension: Secondary | ICD-10-CM | POA: Diagnosis not present

## 2018-04-04 DIAGNOSIS — R05 Cough: Secondary | ICD-10-CM | POA: Diagnosis not present

## 2018-04-06 DIAGNOSIS — I252 Old myocardial infarction: Secondary | ICD-10-CM | POA: Diagnosis not present

## 2018-04-06 DIAGNOSIS — I420 Dilated cardiomyopathy: Secondary | ICD-10-CM | POA: Diagnosis not present

## 2018-04-06 DIAGNOSIS — I214 Non-ST elevation (NSTEMI) myocardial infarction: Secondary | ICD-10-CM | POA: Diagnosis not present

## 2018-04-07 DIAGNOSIS — I214 Non-ST elevation (NSTEMI) myocardial infarction: Secondary | ICD-10-CM | POA: Diagnosis not present

## 2018-04-18 DIAGNOSIS — I214 Non-ST elevation (NSTEMI) myocardial infarction: Secondary | ICD-10-CM | POA: Diagnosis not present

## 2018-04-21 ENCOUNTER — Other Ambulatory Visit: Payer: Self-pay | Admitting: Family

## 2018-04-21 DIAGNOSIS — I5022 Chronic systolic (congestive) heart failure: Secondary | ICD-10-CM | POA: Diagnosis not present

## 2018-04-21 DIAGNOSIS — Z8673 Personal history of transient ischemic attack (TIA), and cerebral infarction without residual deficits: Secondary | ICD-10-CM | POA: Diagnosis not present

## 2018-04-21 DIAGNOSIS — I110 Hypertensive heart disease with heart failure: Secondary | ICD-10-CM | POA: Diagnosis not present

## 2018-04-21 DIAGNOSIS — I2510 Atherosclerotic heart disease of native coronary artery without angina pectoris: Secondary | ICD-10-CM | POA: Diagnosis not present

## 2018-04-21 DIAGNOSIS — D510 Vitamin B12 deficiency anemia due to intrinsic factor deficiency: Secondary | ICD-10-CM

## 2018-04-21 DIAGNOSIS — E7211 Homocystinuria: Principal | ICD-10-CM

## 2018-04-21 DIAGNOSIS — E119 Type 2 diabetes mellitus without complications: Secondary | ICD-10-CM | POA: Diagnosis not present

## 2018-04-21 DIAGNOSIS — R7989 Other specified abnormal findings of blood chemistry: Secondary | ICD-10-CM

## 2018-04-23 DIAGNOSIS — M791 Myalgia, unspecified site: Secondary | ICD-10-CM | POA: Diagnosis not present

## 2018-04-25 ENCOUNTER — Other Ambulatory Visit: Payer: Self-pay | Admitting: Family

## 2018-04-25 DIAGNOSIS — I214 Non-ST elevation (NSTEMI) myocardial infarction: Secondary | ICD-10-CM | POA: Diagnosis not present

## 2018-04-28 DIAGNOSIS — I214 Non-ST elevation (NSTEMI) myocardial infarction: Secondary | ICD-10-CM | POA: Diagnosis not present

## 2018-05-02 DIAGNOSIS — I214 Non-ST elevation (NSTEMI) myocardial infarction: Secondary | ICD-10-CM | POA: Diagnosis not present

## 2018-05-04 DIAGNOSIS — I214 Non-ST elevation (NSTEMI) myocardial infarction: Secondary | ICD-10-CM | POA: Diagnosis not present

## 2018-05-05 DIAGNOSIS — I214 Non-ST elevation (NSTEMI) myocardial infarction: Secondary | ICD-10-CM | POA: Diagnosis not present

## 2018-05-06 DIAGNOSIS — E1165 Type 2 diabetes mellitus with hyperglycemia: Secondary | ICD-10-CM | POA: Diagnosis not present

## 2018-05-06 DIAGNOSIS — E782 Mixed hyperlipidemia: Secondary | ICD-10-CM | POA: Diagnosis not present

## 2018-05-06 DIAGNOSIS — I10 Essential (primary) hypertension: Secondary | ICD-10-CM | POA: Diagnosis not present

## 2018-05-06 DIAGNOSIS — I2510 Atherosclerotic heart disease of native coronary artery without angina pectoris: Secondary | ICD-10-CM | POA: Diagnosis not present

## 2018-05-06 DIAGNOSIS — J45909 Unspecified asthma, uncomplicated: Secondary | ICD-10-CM | POA: Diagnosis not present

## 2018-05-07 DIAGNOSIS — I252 Old myocardial infarction: Secondary | ICD-10-CM | POA: Diagnosis not present

## 2018-05-07 DIAGNOSIS — I420 Dilated cardiomyopathy: Secondary | ICD-10-CM | POA: Diagnosis not present

## 2018-05-07 DIAGNOSIS — I214 Non-ST elevation (NSTEMI) myocardial infarction: Secondary | ICD-10-CM | POA: Diagnosis not present

## 2018-05-09 DIAGNOSIS — I214 Non-ST elevation (NSTEMI) myocardial infarction: Secondary | ICD-10-CM | POA: Diagnosis not present

## 2018-05-11 DIAGNOSIS — I214 Non-ST elevation (NSTEMI) myocardial infarction: Secondary | ICD-10-CM | POA: Diagnosis not present

## 2018-05-12 DIAGNOSIS — I214 Non-ST elevation (NSTEMI) myocardial infarction: Secondary | ICD-10-CM | POA: Diagnosis not present

## 2018-05-16 DIAGNOSIS — I214 Non-ST elevation (NSTEMI) myocardial infarction: Secondary | ICD-10-CM | POA: Diagnosis not present

## 2018-05-18 DIAGNOSIS — I214 Non-ST elevation (NSTEMI) myocardial infarction: Secondary | ICD-10-CM | POA: Diagnosis not present

## 2018-05-18 DIAGNOSIS — M170 Bilateral primary osteoarthritis of knee: Secondary | ICD-10-CM | POA: Diagnosis not present

## 2018-05-18 DIAGNOSIS — M2241 Chondromalacia patellae, right knee: Secondary | ICD-10-CM | POA: Diagnosis not present

## 2018-05-18 DIAGNOSIS — M2242 Chondromalacia patellae, left knee: Secondary | ICD-10-CM | POA: Diagnosis not present

## 2018-05-19 DIAGNOSIS — I214 Non-ST elevation (NSTEMI) myocardial infarction: Secondary | ICD-10-CM | POA: Diagnosis not present

## 2018-05-23 DIAGNOSIS — I214 Non-ST elevation (NSTEMI) myocardial infarction: Secondary | ICD-10-CM | POA: Diagnosis not present

## 2018-05-24 ENCOUNTER — Inpatient Hospital Stay: Payer: Medicare Other | Attending: Family

## 2018-05-24 ENCOUNTER — Ambulatory Visit: Payer: Medicare Other | Admitting: Family

## 2018-05-24 ENCOUNTER — Other Ambulatory Visit: Payer: Medicare Other

## 2018-05-24 ENCOUNTER — Inpatient Hospital Stay (HOSPITAL_BASED_OUTPATIENT_CLINIC_OR_DEPARTMENT_OTHER): Payer: Medicare Other | Admitting: Family

## 2018-05-24 VITALS — BP 102/51 | HR 57 | Temp 98.3°F | Resp 18 | Wt 182.8 lb

## 2018-05-24 DIAGNOSIS — Z9884 Bariatric surgery status: Secondary | ICD-10-CM | POA: Diagnosis not present

## 2018-05-24 DIAGNOSIS — I2510 Atherosclerotic heart disease of native coronary artery without angina pectoris: Secondary | ICD-10-CM | POA: Insufficient documentation

## 2018-05-24 DIAGNOSIS — R5383 Other fatigue: Secondary | ICD-10-CM | POA: Diagnosis not present

## 2018-05-24 DIAGNOSIS — D510 Vitamin B12 deficiency anemia due to intrinsic factor deficiency: Secondary | ICD-10-CM

## 2018-05-24 DIAGNOSIS — E559 Vitamin D deficiency, unspecified: Secondary | ICD-10-CM

## 2018-05-24 DIAGNOSIS — Z79899 Other long term (current) drug therapy: Secondary | ICD-10-CM | POA: Diagnosis not present

## 2018-05-24 DIAGNOSIS — K909 Intestinal malabsorption, unspecified: Secondary | ICD-10-CM

## 2018-05-24 DIAGNOSIS — D509 Iron deficiency anemia, unspecified: Secondary | ICD-10-CM

## 2018-05-24 DIAGNOSIS — E7211 Homocystinuria: Secondary | ICD-10-CM | POA: Insufficient documentation

## 2018-05-24 DIAGNOSIS — D508 Other iron deficiency anemias: Secondary | ICD-10-CM

## 2018-05-24 DIAGNOSIS — R7989 Other specified abnormal findings of blood chemistry: Secondary | ICD-10-CM

## 2018-05-24 DIAGNOSIS — M791 Myalgia, unspecified site: Secondary | ICD-10-CM | POA: Diagnosis not present

## 2018-05-24 LAB — RETICULOCYTES
IMMATURE RETIC FRACT: 9.5 % (ref 2.3–15.9)
RBC.: 3.68 MIL/uL — ABNORMAL LOW (ref 3.87–5.11)
Retic Count, Absolute: 48.9 10*3/uL (ref 19.0–186.0)
Retic Ct Pct: 1.3 % (ref 0.4–3.1)

## 2018-05-24 LAB — CBC WITH DIFFERENTIAL (CANCER CENTER ONLY)
Abs Immature Granulocytes: 0.01 10*3/uL (ref 0.00–0.07)
Basophils Absolute: 0 10*3/uL (ref 0.0–0.1)
Basophils Relative: 1 %
Eosinophils Absolute: 0.2 10*3/uL (ref 0.0–0.5)
Eosinophils Relative: 4 %
HCT: 36 % (ref 36.0–46.0)
Hemoglobin: 11.5 g/dL — ABNORMAL LOW (ref 12.0–15.0)
Immature Granulocytes: 0 %
Lymphocytes Relative: 39 %
Lymphs Abs: 2.3 10*3/uL (ref 0.7–4.0)
MCH: 31.3 pg (ref 26.0–34.0)
MCHC: 31.9 g/dL (ref 30.0–36.0)
MCV: 97.8 fL (ref 80.0–100.0)
MONO ABS: 0.5 10*3/uL (ref 0.1–1.0)
Monocytes Relative: 9 %
NEUTROS ABS: 2.7 10*3/uL (ref 1.7–7.7)
Neutrophils Relative %: 47 %
Platelet Count: 250 10*3/uL (ref 150–400)
RBC: 3.68 MIL/uL — ABNORMAL LOW (ref 3.87–5.11)
RDW: 13.6 % (ref 11.5–15.5)
WBC Count: 5.7 10*3/uL (ref 4.0–10.5)
nRBC: 0 % (ref 0.0–0.2)

## 2018-05-24 LAB — VITAMIN B12: Vitamin B-12: 803 pg/mL (ref 180–914)

## 2018-05-24 NOTE — Progress Notes (Signed)
Hematology and Oncology Follow Up Visit  Kimberly Ramos [id number] 1970-09-13 48 y.o. 05/24/2018   Principle Diagnosis:  Iron deficiencyanemiasecondary to malabsorption (gastric sleeve 2018) Hyper homocystinemia   Current Therapy:   IV iron as indicated - last received inJanuary2019 Folic acid 1 mg PO daily   Interim History:  Kimberly Ramos is here today for follow-up. She is doing well but has noted some mild fatigue and is craving/chewing ice.  She denies noting any episodes of bleeding. She does bruise easily on Brilinta and Eliquis but not in excess.  She is still wearing her life vest but states it has not gone off. She states that she sees her cardiologist in February and will have an EKG at that time. She is hoping to be able to remove the vest at that time.  She is currently going to cardiac rehab 3 days a week. She is fatigued that days in the afternoon after her workout.  She had a mild case of the flu earlier this month and was symptomatic with body aches, chills and nausea. No appetite. These symptoms have resolved with the exception of the fatigue.  No fever, chills, n/v, cough, rash, dizziness, SOB, chest pain, palpitations, abdominal pain or changes in bowel or bladder habits.  No swelling, tenderness, numbness or tingling in her extremities at this time.  She takes lasix which prevent fluid retention in her feet and ankles.  No lymphadenopathy noted on exam.  Her appetite has returned and she is staying hydrated. Her weight is down 12 lbs since her last visit 6 weeks ago.   ECOG Performance Status: 1 - Symptomatic but completely ambulatory  Medications:  Allergies as of 05/24/2018      Reactions   Ciprofloxacin Hives, Swelling   Lisinopril Hives, Itching   Face swelling  Face swelling  Face swelling  Face swelling  Face swelling  Face swelling  Face swelling  Face swelling  Face swelling  Face swelling       Medication List       Accurate as of May 24, 2018 11:04 AM. Always use your most recent med list.        albuterol 108 (90 Base) MCG/ACT inhaler Commonly known as:  PROVENTIL HFA;VENTOLIN HFA Inhale into the lungs.   albuterol 1.25 MG/3ML nebulizer solution Commonly known as:  ACCUNEB PLEASE SEE ATTACHED FOR DETAILED DIRECTIONS   apixaban 5 MG Tabs tablet Commonly known as:  ELIQUIS Take 5 mg by mouth 2 (two) times daily.   atorvastatin 80 MG tablet Commonly known as:  LIPITOR Take 80 mg by mouth at bedtime.   Azelastine HCl 0.15 % Soln Place 1 spray into the nose 2 (two) times daily as needed.   Budesonide 90 MCG/ACT inhaler Inhale into the lungs.   carvedilol 3.125 MG tablet Commonly known as:  COREG Take 3.125 mg by mouth 2 (two) times daily.   carvedilol 6.25 MG tablet Commonly known as:  COREG Take 6.25 mg by mouth 2 (two) times daily.   cetirizine 10 MG tablet Commonly known as:  ZYRTEC Take by mouth daily.   CLEVER CHOICE TENS UNIT Devi TENS Unit (Flex IT or Flex MT Plus) and Supplies (Dx: Lumbar radiculopathy [M54.16])  .   diclofenac sodium 1 % Gel Commonly known as:  VOLTAREN Apply topically.   EPINEPHrine 0.3 mg/0.3 mL Soaj injection Commonly known as:  EPI-PEN Use as directed for severe allergic reaction.   esomeprazole 40 MG capsule Commonly known as:  NEXIUM  TAKE 1 CAPSULE TWICE DAILY 30 MINUTES PRIOR TO BREAKFAST AND DINNER.   ferrous sulfate 325 (65 FE) MG EC tablet Take 325 mg by mouth daily.   fluticasone 44 MCG/ACT inhaler Commonly known as:  FLOVENT HFA INHALE 2 PUFFS EVERY DAY   folic acid 1 MG tablet Commonly known as:  FOLVITE Take 1 tablet (1 mg total) by mouth daily.   furosemide 40 MG tablet Commonly known as:  LASIX Take 40 mg by mouth as needed.   gabapentin 300 MG capsule Commonly known as:  NEURONTIN Take 600 mg by mouth at bedtime.   HYDROcodone-acetaminophen 7.5-325 MG tablet Commonly known as:  NORCO Take by mouth.   losartan 25 MG tablet Commonly  known as:  COZAAR Take 50 mg by mouth daily.   losartan 50 MG tablet Commonly known as:  COZAAR   magnesium oxide 400 MG tablet Commonly known as:  MAG-OX Take by mouth.   metroNIDAZOLE 0.75 % vaginal gel Commonly known as:  METROGEL USE EVERY NIGHT VAGINALLY   nitroGLYCERIN 0.4 MG SL tablet Commonly known as:  NITROSTAT Place under the tongue.   Olopatadine HCl 0.2 % Soln Commonly known as:  PATADAY Place 1 drop into both eyes 1 day or 1 dose.   ONETOUCH VERIO test strip Generic drug:  glucose blood 2 (two) times daily. for testing   potassium chloride 10 MEQ CR capsule Commonly known as:  MICRO-K Take by mouth.   sacubitril-valsartan 49-51 MG Commonly known as:  ENTRESTO Take by mouth.   sitaGLIPtin-metformin 50-500 MG tablet Commonly known as:  JANUMET Take by mouth 2 (two) times daily with a meal.   ticagrelor 90 MG Tabs tablet Commonly known as:  BRILINTA TAKE 1 TABLET (90 MG TOTAL) BY MOUTH 2 (TWO) TIMES DAILY.   tiZANidine 2 MG tablet Commonly known as:  ZANAFLEX Take by mouth once.   Vitamin D3 1.25 MG (50000 UT) Caps 50,000 Units once a week.       Allergies:  Allergies  Allergen Reactions  . Ciprofloxacin Hives and Swelling  . Lisinopril Hives and Itching    Face swelling  Face swelling  Face swelling  Face swelling  Face swelling  Face swelling  Face swelling  Face swelling  Face swelling  Face swelling      Past Medical History, Surgical history, Social history, and Family History were reviewed and updated.  Review of Systems: All other 10 point review of systems is negative.   Physical Exam:  vitals were not taken for this visit.   Wt Readings from Last 3 Encounters:  03/29/18 194 lb 4 oz (88.1 kg)  02/14/18 209 lb (94.8 kg)  10/25/17 218 lb (98.9 kg)    Ocular: Sclerae unicteric, pupils equal, round and reactive to light Ear-nose-throat: Oropharynx clear, dentition fair Lymphatic: No cervical, supraclavicular or  axillary adenopathy Lungs no rales or rhonchi, good excursion bilaterally Heart regular rate and rhythm, no murmur appreciated Abd soft, nontender, positive bowel sounds, no liver or spleen tip palpated on exam, no fluid wave  MSK no focal spinal tenderness, no joint edema Neuro: non-focal, well-oriented, appropriate affect Breasts: Deferred   Lab Results  Component Value Date   WBC 6.0 03/29/2018   HGB 10.9 (L) 03/29/2018   HCT 34.6 (L) 03/29/2018   MCV 98.6 03/29/2018   PLT 226 03/29/2018   Lab Results  Component Value Date   FERRITIN 230 03/29/2018   IRON 51 03/29/2018   TIBC 220 (L) 03/29/2018   UIBC  169 03/29/2018   IRONPCTSAT 23 03/29/2018   Lab Results  Component Value Date   RETICCTPCT 1.2 03/29/2018   RBC 3.51 (L) 03/29/2018   RBC 3.51 (L) 03/29/2018   No results found for: KPAFRELGTCHN, LAMBDASER, KAPLAMBRATIO No results found for: IGGSERUM, IGA, IGMSERUM No results found for: TOTALPROTELP, ALBUMINELP, A1GS, A2GS, BETS, BETA2SER, GAMS, MSPIKE, SPEI   Chemistry      Component Value Date/Time   NA 146 (H) 01/18/2017 1310   K 4.8 (H) 01/18/2017 1310   CL 107 01/18/2017 1310   CO2 27 01/18/2017 1310   BUN 21 01/18/2017 1310   CREATININE 1.4 (H) 01/18/2017 1310      Component Value Date/Time   CALCIUM 9.2 01/18/2017 1310   ALKPHOS 100 (H) 01/18/2017 1310   AST 22 01/18/2017 1310   ALT 19 01/18/2017 1310   BILITOT 0.60 01/18/2017 1310      Impression and Plan: Ms. Obenauer is a very pleasant 48 yo African American female with iron deficiency anemia secondary to malabsorption after gastric bypass in 2018.  She is symptomatic with mild fatigue and chewing ice.  We will see what her iron studies show and bring her back in for infusion if needed.  We will plan to see her back in another 2 months.  She will contact our office with any questions or concerns. We cancertainly see her sooner if need be.   Emeline Gins, NP 1/28/202011:04 AM

## 2018-05-25 DIAGNOSIS — Z9884 Bariatric surgery status: Secondary | ICD-10-CM | POA: Diagnosis not present

## 2018-05-25 DIAGNOSIS — I214 Non-ST elevation (NSTEMI) myocardial infarction: Secondary | ICD-10-CM | POA: Diagnosis not present

## 2018-05-25 DIAGNOSIS — G4733 Obstructive sleep apnea (adult) (pediatric): Secondary | ICD-10-CM | POA: Diagnosis not present

## 2018-05-25 DIAGNOSIS — I2102 ST elevation (STEMI) myocardial infarction involving left anterior descending coronary artery: Secondary | ICD-10-CM | POA: Diagnosis not present

## 2018-05-25 DIAGNOSIS — E119 Type 2 diabetes mellitus without complications: Secondary | ICD-10-CM | POA: Diagnosis not present

## 2018-05-25 LAB — IRON AND TIBC
Iron: 69 ug/dL (ref 41–142)
SATURATION RATIOS: 28 % (ref 21–57)
TIBC: 246 ug/dL (ref 236–444)
UIBC: 177 ug/dL (ref 120–384)

## 2018-05-25 LAB — VITAMIN D 25 HYDROXY (VIT D DEFICIENCY, FRACTURES): Vit D, 25-Hydroxy: 65.3 ng/mL (ref 30.0–100.0)

## 2018-05-25 LAB — FERRITIN: Ferritin: 161 ng/mL (ref 11–307)

## 2018-05-25 LAB — HOMOCYSTEINE: Homocysteine: 18.2 umol/L — ABNORMAL HIGH (ref 0.0–14.5)

## 2018-05-26 ENCOUNTER — Other Ambulatory Visit: Payer: Medicare Other

## 2018-05-26 ENCOUNTER — Ambulatory Visit: Payer: Medicare Other | Admitting: Family

## 2018-05-26 DIAGNOSIS — L2089 Other atopic dermatitis: Secondary | ICD-10-CM | POA: Diagnosis not present

## 2018-05-26 DIAGNOSIS — L218 Other seborrheic dermatitis: Secondary | ICD-10-CM | POA: Diagnosis not present

## 2018-05-26 DIAGNOSIS — I214 Non-ST elevation (NSTEMI) myocardial infarction: Secondary | ICD-10-CM | POA: Diagnosis not present

## 2018-05-26 DIAGNOSIS — L700 Acne vulgaris: Secondary | ICD-10-CM | POA: Diagnosis not present

## 2018-05-30 DIAGNOSIS — Z5189 Encounter for other specified aftercare: Secondary | ICD-10-CM | POA: Diagnosis not present

## 2018-05-30 DIAGNOSIS — I214 Non-ST elevation (NSTEMI) myocardial infarction: Secondary | ICD-10-CM | POA: Diagnosis not present

## 2018-06-01 DIAGNOSIS — M7752 Other enthesopathy of left foot: Secondary | ICD-10-CM | POA: Diagnosis not present

## 2018-06-01 DIAGNOSIS — E1165 Type 2 diabetes mellitus with hyperglycemia: Secondary | ICD-10-CM | POA: Diagnosis not present

## 2018-06-01 DIAGNOSIS — I214 Non-ST elevation (NSTEMI) myocardial infarction: Secondary | ICD-10-CM | POA: Diagnosis not present

## 2018-06-01 DIAGNOSIS — Z5189 Encounter for other specified aftercare: Secondary | ICD-10-CM | POA: Diagnosis not present

## 2018-06-02 DIAGNOSIS — I214 Non-ST elevation (NSTEMI) myocardial infarction: Secondary | ICD-10-CM | POA: Diagnosis not present

## 2018-06-02 DIAGNOSIS — Z5189 Encounter for other specified aftercare: Secondary | ICD-10-CM | POA: Diagnosis not present

## 2018-06-02 DIAGNOSIS — I340 Nonrheumatic mitral (valve) insufficiency: Secondary | ICD-10-CM | POA: Diagnosis not present

## 2018-06-02 DIAGNOSIS — I272 Pulmonary hypertension, unspecified: Secondary | ICD-10-CM | POA: Diagnosis not present

## 2018-06-06 DIAGNOSIS — I214 Non-ST elevation (NSTEMI) myocardial infarction: Secondary | ICD-10-CM | POA: Diagnosis not present

## 2018-06-06 DIAGNOSIS — Z5189 Encounter for other specified aftercare: Secondary | ICD-10-CM | POA: Diagnosis not present

## 2018-06-07 DIAGNOSIS — M2242 Chondromalacia patellae, left knee: Secondary | ICD-10-CM | POA: Diagnosis not present

## 2018-06-07 DIAGNOSIS — M2241 Chondromalacia patellae, right knee: Secondary | ICD-10-CM | POA: Diagnosis not present

## 2018-06-07 DIAGNOSIS — M545 Low back pain: Secondary | ICD-10-CM | POA: Diagnosis not present

## 2018-06-07 DIAGNOSIS — I214 Non-ST elevation (NSTEMI) myocardial infarction: Secondary | ICD-10-CM | POA: Diagnosis not present

## 2018-06-07 DIAGNOSIS — M25542 Pain in joints of left hand: Secondary | ICD-10-CM | POA: Diagnosis not present

## 2018-06-07 DIAGNOSIS — I252 Old myocardial infarction: Secondary | ICD-10-CM | POA: Diagnosis not present

## 2018-06-07 DIAGNOSIS — I420 Dilated cardiomyopathy: Secondary | ICD-10-CM | POA: Diagnosis not present

## 2018-06-08 DIAGNOSIS — Z5189 Encounter for other specified aftercare: Secondary | ICD-10-CM | POA: Diagnosis not present

## 2018-06-08 DIAGNOSIS — I214 Non-ST elevation (NSTEMI) myocardial infarction: Secondary | ICD-10-CM | POA: Diagnosis not present

## 2018-06-09 DIAGNOSIS — I214 Non-ST elevation (NSTEMI) myocardial infarction: Secondary | ICD-10-CM | POA: Diagnosis not present

## 2018-06-09 DIAGNOSIS — Z5189 Encounter for other specified aftercare: Secondary | ICD-10-CM | POA: Diagnosis not present

## 2018-06-13 DIAGNOSIS — I214 Non-ST elevation (NSTEMI) myocardial infarction: Secondary | ICD-10-CM | POA: Diagnosis not present

## 2018-06-13 DIAGNOSIS — I2510 Atherosclerotic heart disease of native coronary artery without angina pectoris: Secondary | ICD-10-CM | POA: Diagnosis not present

## 2018-06-13 DIAGNOSIS — I110 Hypertensive heart disease with heart failure: Secondary | ICD-10-CM | POA: Diagnosis not present

## 2018-06-13 DIAGNOSIS — Z5189 Encounter for other specified aftercare: Secondary | ICD-10-CM | POA: Diagnosis not present

## 2018-06-13 DIAGNOSIS — I5022 Chronic systolic (congestive) heart failure: Secondary | ICD-10-CM | POA: Diagnosis not present

## 2018-06-13 DIAGNOSIS — E785 Hyperlipidemia, unspecified: Secondary | ICD-10-CM | POA: Diagnosis not present

## 2018-06-13 DIAGNOSIS — E559 Vitamin D deficiency, unspecified: Secondary | ICD-10-CM | POA: Diagnosis not present

## 2018-06-14 DIAGNOSIS — R001 Bradycardia, unspecified: Secondary | ICD-10-CM | POA: Diagnosis not present

## 2018-06-14 DIAGNOSIS — R9431 Abnormal electrocardiogram [ECG] [EKG]: Secondary | ICD-10-CM | POA: Diagnosis not present

## 2018-06-16 DIAGNOSIS — Z5189 Encounter for other specified aftercare: Secondary | ICD-10-CM | POA: Diagnosis not present

## 2018-06-16 DIAGNOSIS — I214 Non-ST elevation (NSTEMI) myocardial infarction: Secondary | ICD-10-CM | POA: Diagnosis not present

## 2018-06-20 DIAGNOSIS — I214 Non-ST elevation (NSTEMI) myocardial infarction: Secondary | ICD-10-CM | POA: Diagnosis not present

## 2018-06-20 DIAGNOSIS — Z5189 Encounter for other specified aftercare: Secondary | ICD-10-CM | POA: Diagnosis not present

## 2018-06-22 DIAGNOSIS — I214 Non-ST elevation (NSTEMI) myocardial infarction: Secondary | ICD-10-CM | POA: Diagnosis not present

## 2018-06-22 DIAGNOSIS — Z5189 Encounter for other specified aftercare: Secondary | ICD-10-CM | POA: Diagnosis not present

## 2018-06-23 DIAGNOSIS — Z5189 Encounter for other specified aftercare: Secondary | ICD-10-CM | POA: Diagnosis not present

## 2018-06-23 DIAGNOSIS — I214 Non-ST elevation (NSTEMI) myocardial infarction: Secondary | ICD-10-CM | POA: Diagnosis not present

## 2018-06-24 DIAGNOSIS — M791 Myalgia, unspecified site: Secondary | ICD-10-CM | POA: Diagnosis not present

## 2018-06-27 DIAGNOSIS — Z5189 Encounter for other specified aftercare: Secondary | ICD-10-CM | POA: Diagnosis not present

## 2018-06-27 DIAGNOSIS — I214 Non-ST elevation (NSTEMI) myocardial infarction: Secondary | ICD-10-CM | POA: Diagnosis not present

## 2018-06-29 DIAGNOSIS — I214 Non-ST elevation (NSTEMI) myocardial infarction: Secondary | ICD-10-CM | POA: Diagnosis not present

## 2018-06-29 DIAGNOSIS — Z5189 Encounter for other specified aftercare: Secondary | ICD-10-CM | POA: Diagnosis not present

## 2018-06-30 DIAGNOSIS — I214 Non-ST elevation (NSTEMI) myocardial infarction: Secondary | ICD-10-CM | POA: Diagnosis not present

## 2018-06-30 DIAGNOSIS — Z5189 Encounter for other specified aftercare: Secondary | ICD-10-CM | POA: Diagnosis not present

## 2018-07-04 DIAGNOSIS — Z5189 Encounter for other specified aftercare: Secondary | ICD-10-CM | POA: Diagnosis not present

## 2018-07-04 DIAGNOSIS — I214 Non-ST elevation (NSTEMI) myocardial infarction: Secondary | ICD-10-CM | POA: Diagnosis not present

## 2018-07-06 DIAGNOSIS — I420 Dilated cardiomyopathy: Secondary | ICD-10-CM | POA: Diagnosis not present

## 2018-07-06 DIAGNOSIS — I214 Non-ST elevation (NSTEMI) myocardial infarction: Secondary | ICD-10-CM | POA: Diagnosis not present

## 2018-07-06 DIAGNOSIS — Z5189 Encounter for other specified aftercare: Secondary | ICD-10-CM | POA: Diagnosis not present

## 2018-07-06 DIAGNOSIS — I252 Old myocardial infarction: Secondary | ICD-10-CM | POA: Diagnosis not present

## 2018-07-08 DIAGNOSIS — E782 Mixed hyperlipidemia: Secondary | ICD-10-CM | POA: Diagnosis not present

## 2018-07-08 DIAGNOSIS — I10 Essential (primary) hypertension: Secondary | ICD-10-CM | POA: Diagnosis not present

## 2018-07-08 DIAGNOSIS — I2510 Atherosclerotic heart disease of native coronary artery without angina pectoris: Secondary | ICD-10-CM | POA: Diagnosis not present

## 2018-07-08 DIAGNOSIS — J449 Chronic obstructive pulmonary disease, unspecified: Secondary | ICD-10-CM | POA: Diagnosis not present

## 2018-07-08 DIAGNOSIS — E1165 Type 2 diabetes mellitus with hyperglycemia: Secondary | ICD-10-CM | POA: Diagnosis not present

## 2018-07-23 DIAGNOSIS — M791 Myalgia, unspecified site: Secondary | ICD-10-CM | POA: Diagnosis not present

## 2018-07-26 ENCOUNTER — Inpatient Hospital Stay: Payer: Medicare Other | Admitting: Hematology & Oncology

## 2018-07-26 ENCOUNTER — Inpatient Hospital Stay: Payer: Medicare Other

## 2018-07-26 ENCOUNTER — Other Ambulatory Visit: Payer: Medicare Other

## 2018-08-02 DIAGNOSIS — I5022 Chronic systolic (congestive) heart failure: Secondary | ICD-10-CM | POA: Diagnosis not present

## 2018-08-06 DIAGNOSIS — I420 Dilated cardiomyopathy: Secondary | ICD-10-CM | POA: Diagnosis not present

## 2018-08-06 DIAGNOSIS — I214 Non-ST elevation (NSTEMI) myocardial infarction: Secondary | ICD-10-CM | POA: Diagnosis not present

## 2018-08-06 DIAGNOSIS — I252 Old myocardial infarction: Secondary | ICD-10-CM | POA: Diagnosis not present

## 2018-08-10 DIAGNOSIS — G4733 Obstructive sleep apnea (adult) (pediatric): Secondary | ICD-10-CM | POA: Diagnosis not present

## 2018-08-15 DIAGNOSIS — I2510 Atherosclerotic heart disease of native coronary artery without angina pectoris: Secondary | ICD-10-CM | POA: Diagnosis not present

## 2018-08-15 DIAGNOSIS — E782 Mixed hyperlipidemia: Secondary | ICD-10-CM | POA: Diagnosis not present

## 2018-08-15 DIAGNOSIS — J449 Chronic obstructive pulmonary disease, unspecified: Secondary | ICD-10-CM | POA: Diagnosis not present

## 2018-08-15 DIAGNOSIS — E1165 Type 2 diabetes mellitus with hyperglycemia: Secondary | ICD-10-CM | POA: Diagnosis not present

## 2018-08-15 DIAGNOSIS — I10 Essential (primary) hypertension: Secondary | ICD-10-CM | POA: Diagnosis not present

## 2018-08-22 DIAGNOSIS — Z9989 Dependence on other enabling machines and devices: Secondary | ICD-10-CM | POA: Diagnosis not present

## 2018-08-22 DIAGNOSIS — E119 Type 2 diabetes mellitus without complications: Secondary | ICD-10-CM | POA: Diagnosis not present

## 2018-08-22 DIAGNOSIS — G4733 Obstructive sleep apnea (adult) (pediatric): Secondary | ICD-10-CM | POA: Diagnosis not present

## 2018-08-22 DIAGNOSIS — I69351 Hemiplegia and hemiparesis following cerebral infarction affecting right dominant side: Secondary | ICD-10-CM | POA: Diagnosis not present

## 2018-08-22 DIAGNOSIS — I2510 Atherosclerotic heart disease of native coronary artery without angina pectoris: Secondary | ICD-10-CM | POA: Diagnosis not present

## 2018-08-22 DIAGNOSIS — Z7984 Long term (current) use of oral hypoglycemic drugs: Secondary | ICD-10-CM | POA: Diagnosis not present

## 2018-08-22 DIAGNOSIS — Z95811 Presence of heart assist device: Secondary | ICD-10-CM | POA: Diagnosis not present

## 2018-08-22 DIAGNOSIS — I5022 Chronic systolic (congestive) heart failure: Secondary | ICD-10-CM | POA: Diagnosis not present

## 2018-08-22 DIAGNOSIS — I110 Hypertensive heart disease with heart failure: Secondary | ICD-10-CM | POA: Diagnosis not present

## 2018-08-22 DIAGNOSIS — Z7901 Long term (current) use of anticoagulants: Secondary | ICD-10-CM | POA: Diagnosis not present

## 2018-08-22 DIAGNOSIS — I255 Ischemic cardiomyopathy: Secondary | ICD-10-CM | POA: Diagnosis not present

## 2018-08-22 DIAGNOSIS — Z87891 Personal history of nicotine dependence: Secondary | ICD-10-CM | POA: Diagnosis not present

## 2018-08-23 DIAGNOSIS — M791 Myalgia, unspecified site: Secondary | ICD-10-CM | POA: Diagnosis not present

## 2018-08-25 DIAGNOSIS — I110 Hypertensive heart disease with heart failure: Secondary | ICD-10-CM | POA: Diagnosis not present

## 2018-08-25 DIAGNOSIS — E785 Hyperlipidemia, unspecified: Secondary | ICD-10-CM | POA: Diagnosis not present

## 2018-08-25 DIAGNOSIS — E119 Type 2 diabetes mellitus without complications: Secondary | ICD-10-CM | POA: Diagnosis not present

## 2018-08-25 DIAGNOSIS — I5022 Chronic systolic (congestive) heart failure: Secondary | ICD-10-CM | POA: Diagnosis not present

## 2018-08-25 DIAGNOSIS — D508 Other iron deficiency anemias: Secondary | ICD-10-CM | POA: Diagnosis not present

## 2018-09-05 DIAGNOSIS — I214 Non-ST elevation (NSTEMI) myocardial infarction: Secondary | ICD-10-CM | POA: Diagnosis not present

## 2018-09-05 DIAGNOSIS — I252 Old myocardial infarction: Secondary | ICD-10-CM | POA: Diagnosis not present

## 2018-09-05 DIAGNOSIS — I420 Dilated cardiomyopathy: Secondary | ICD-10-CM | POA: Diagnosis not present

## 2018-09-15 DIAGNOSIS — M545 Low back pain: Secondary | ICD-10-CM | POA: Diagnosis not present

## 2018-09-15 DIAGNOSIS — Z79899 Other long term (current) drug therapy: Secondary | ICD-10-CM | POA: Diagnosis not present

## 2018-09-20 DIAGNOSIS — E7211 Homocystinuria: Secondary | ICD-10-CM | POA: Diagnosis not present

## 2018-09-20 DIAGNOSIS — I2510 Atherosclerotic heart disease of native coronary artery without angina pectoris: Secondary | ICD-10-CM | POA: Diagnosis not present

## 2018-09-20 DIAGNOSIS — I5022 Chronic systolic (congestive) heart failure: Secondary | ICD-10-CM | POA: Diagnosis not present

## 2018-09-20 DIAGNOSIS — Z8673 Personal history of transient ischemic attack (TIA), and cerebral infarction without residual deficits: Secondary | ICD-10-CM | POA: Diagnosis not present

## 2018-09-20 DIAGNOSIS — I10 Essential (primary) hypertension: Secondary | ICD-10-CM | POA: Diagnosis not present

## 2018-09-22 DIAGNOSIS — M791 Myalgia, unspecified site: Secondary | ICD-10-CM | POA: Diagnosis not present

## 2018-10-06 DIAGNOSIS — I252 Old myocardial infarction: Secondary | ICD-10-CM | POA: Diagnosis not present

## 2018-10-06 DIAGNOSIS — I214 Non-ST elevation (NSTEMI) myocardial infarction: Secondary | ICD-10-CM | POA: Diagnosis not present

## 2018-10-06 DIAGNOSIS — I420 Dilated cardiomyopathy: Secondary | ICD-10-CM | POA: Diagnosis not present

## 2018-10-20 DIAGNOSIS — I5022 Chronic systolic (congestive) heart failure: Secondary | ICD-10-CM | POA: Diagnosis not present

## 2018-10-20 DIAGNOSIS — Z01812 Encounter for preprocedural laboratory examination: Secondary | ICD-10-CM | POA: Diagnosis not present

## 2018-10-20 DIAGNOSIS — Z1159 Encounter for screening for other viral diseases: Secondary | ICD-10-CM | POA: Diagnosis not present

## 2018-10-21 DIAGNOSIS — Z01812 Encounter for preprocedural laboratory examination: Secondary | ICD-10-CM | POA: Diagnosis not present

## 2018-10-23 DIAGNOSIS — M791 Myalgia, unspecified site: Secondary | ICD-10-CM | POA: Diagnosis not present

## 2018-10-27 DIAGNOSIS — R9431 Abnormal electrocardiogram [ECG] [EKG]: Secondary | ICD-10-CM | POA: Diagnosis not present

## 2018-10-27 DIAGNOSIS — Z23 Encounter for immunization: Secondary | ICD-10-CM | POA: Diagnosis not present

## 2018-10-27 DIAGNOSIS — G4733 Obstructive sleep apnea (adult) (pediatric): Secondary | ICD-10-CM | POA: Diagnosis not present

## 2018-10-27 DIAGNOSIS — Z87891 Personal history of nicotine dependence: Secondary | ICD-10-CM | POA: Diagnosis not present

## 2018-10-27 DIAGNOSIS — I69359 Hemiplegia and hemiparesis following cerebral infarction affecting unspecified side: Secondary | ICD-10-CM | POA: Diagnosis not present

## 2018-10-27 DIAGNOSIS — I5022 Chronic systolic (congestive) heart failure: Secondary | ICD-10-CM | POA: Diagnosis not present

## 2018-10-27 DIAGNOSIS — I255 Ischemic cardiomyopathy: Secondary | ICD-10-CM | POA: Diagnosis not present

## 2018-10-27 DIAGNOSIS — Z95810 Presence of automatic (implantable) cardiac defibrillator: Secondary | ICD-10-CM | POA: Diagnosis not present

## 2018-10-27 DIAGNOSIS — I428 Other cardiomyopathies: Secondary | ICD-10-CM | POA: Diagnosis not present

## 2018-10-28 DIAGNOSIS — Z87891 Personal history of nicotine dependence: Secondary | ICD-10-CM | POA: Diagnosis not present

## 2018-10-28 DIAGNOSIS — G4733 Obstructive sleep apnea (adult) (pediatric): Secondary | ICD-10-CM | POA: Diagnosis not present

## 2018-10-28 DIAGNOSIS — I69359 Hemiplegia and hemiparesis following cerebral infarction affecting unspecified side: Secondary | ICD-10-CM | POA: Diagnosis not present

## 2018-10-28 DIAGNOSIS — Z23 Encounter for immunization: Secondary | ICD-10-CM | POA: Diagnosis not present

## 2018-10-28 DIAGNOSIS — I5022 Chronic systolic (congestive) heart failure: Secondary | ICD-10-CM | POA: Diagnosis not present

## 2018-10-28 DIAGNOSIS — R000 Tachycardia, unspecified: Secondary | ICD-10-CM | POA: Diagnosis not present

## 2018-10-28 DIAGNOSIS — Z95810 Presence of automatic (implantable) cardiac defibrillator: Secondary | ICD-10-CM | POA: Diagnosis not present

## 2018-10-28 DIAGNOSIS — R9431 Abnormal electrocardiogram [ECG] [EKG]: Secondary | ICD-10-CM | POA: Diagnosis not present

## 2018-10-28 DIAGNOSIS — I255 Ischemic cardiomyopathy: Secondary | ICD-10-CM | POA: Diagnosis not present

## 2018-11-07 DIAGNOSIS — Z136 Encounter for screening for cardiovascular disorders: Secondary | ICD-10-CM | POA: Diagnosis not present

## 2018-11-07 DIAGNOSIS — Z0001 Encounter for general adult medical examination with abnormal findings: Secondary | ICD-10-CM | POA: Diagnosis not present

## 2018-11-07 DIAGNOSIS — E1165 Type 2 diabetes mellitus with hyperglycemia: Secondary | ICD-10-CM | POA: Diagnosis not present

## 2018-11-07 DIAGNOSIS — J449 Chronic obstructive pulmonary disease, unspecified: Secondary | ICD-10-CM | POA: Diagnosis not present

## 2018-11-07 DIAGNOSIS — I10 Essential (primary) hypertension: Secondary | ICD-10-CM | POA: Diagnosis not present

## 2018-11-07 DIAGNOSIS — Z1329 Encounter for screening for other suspected endocrine disorder: Secondary | ICD-10-CM | POA: Diagnosis not present

## 2018-11-07 DIAGNOSIS — N183 Chronic kidney disease, stage 3 (moderate): Secondary | ICD-10-CM | POA: Diagnosis not present

## 2018-11-11 DIAGNOSIS — I5022 Chronic systolic (congestive) heart failure: Secondary | ICD-10-CM | POA: Diagnosis not present

## 2018-11-11 DIAGNOSIS — Z95810 Presence of automatic (implantable) cardiac defibrillator: Secondary | ICD-10-CM | POA: Diagnosis not present

## 2018-11-18 DIAGNOSIS — Z9884 Bariatric surgery status: Secondary | ICD-10-CM | POA: Diagnosis not present

## 2018-11-18 DIAGNOSIS — K219 Gastro-esophageal reflux disease without esophagitis: Secondary | ICD-10-CM | POA: Diagnosis not present

## 2018-11-22 DIAGNOSIS — M791 Myalgia, unspecified site: Secondary | ICD-10-CM | POA: Diagnosis not present

## 2018-11-28 DIAGNOSIS — Z86718 Personal history of other venous thrombosis and embolism: Secondary | ICD-10-CM | POA: Diagnosis not present

## 2018-11-28 DIAGNOSIS — D508 Other iron deficiency anemias: Secondary | ICD-10-CM | POA: Diagnosis not present

## 2018-11-28 DIAGNOSIS — Z9884 Bariatric surgery status: Secondary | ICD-10-CM | POA: Diagnosis not present

## 2018-11-28 DIAGNOSIS — E559 Vitamin D deficiency, unspecified: Secondary | ICD-10-CM | POA: Diagnosis not present

## 2018-11-28 DIAGNOSIS — J4530 Mild persistent asthma, uncomplicated: Secondary | ICD-10-CM | POA: Diagnosis not present

## 2018-11-28 DIAGNOSIS — Z87891 Personal history of nicotine dependence: Secondary | ICD-10-CM | POA: Diagnosis not present

## 2018-11-28 DIAGNOSIS — I5022 Chronic systolic (congestive) heart failure: Secondary | ICD-10-CM | POA: Diagnosis not present

## 2018-11-28 DIAGNOSIS — E785 Hyperlipidemia, unspecified: Secondary | ICD-10-CM | POA: Diagnosis not present

## 2018-11-28 DIAGNOSIS — I110 Hypertensive heart disease with heart failure: Secondary | ICD-10-CM | POA: Diagnosis not present

## 2018-11-28 DIAGNOSIS — G4733 Obstructive sleep apnea (adult) (pediatric): Secondary | ICD-10-CM | POA: Diagnosis not present

## 2018-11-28 DIAGNOSIS — I2510 Atherosclerotic heart disease of native coronary artery without angina pectoris: Secondary | ICD-10-CM | POA: Diagnosis not present

## 2018-11-28 DIAGNOSIS — Z9989 Dependence on other enabling machines and devices: Secondary | ICD-10-CM | POA: Diagnosis not present

## 2018-11-28 DIAGNOSIS — Z79899 Other long term (current) drug therapy: Secondary | ICD-10-CM | POA: Diagnosis not present

## 2018-11-28 DIAGNOSIS — Z95810 Presence of automatic (implantable) cardiac defibrillator: Secondary | ICD-10-CM | POA: Diagnosis not present

## 2018-11-28 DIAGNOSIS — Z8673 Personal history of transient ischemic attack (TIA), and cerebral infarction without residual deficits: Secondary | ICD-10-CM | POA: Diagnosis not present

## 2018-11-28 DIAGNOSIS — E119 Type 2 diabetes mellitus without complications: Secondary | ICD-10-CM | POA: Diagnosis not present

## 2018-11-30 DIAGNOSIS — I10 Essential (primary) hypertension: Secondary | ICD-10-CM | POA: Diagnosis not present

## 2018-11-30 DIAGNOSIS — J449 Chronic obstructive pulmonary disease, unspecified: Secondary | ICD-10-CM | POA: Diagnosis not present

## 2018-11-30 DIAGNOSIS — I2510 Atherosclerotic heart disease of native coronary artery without angina pectoris: Secondary | ICD-10-CM | POA: Diagnosis not present

## 2018-11-30 DIAGNOSIS — E1165 Type 2 diabetes mellitus with hyperglycemia: Secondary | ICD-10-CM | POA: Diagnosis not present

## 2018-11-30 DIAGNOSIS — E782 Mixed hyperlipidemia: Secondary | ICD-10-CM | POA: Diagnosis not present

## 2018-11-30 DIAGNOSIS — N183 Chronic kidney disease, stage 3 (moderate): Secondary | ICD-10-CM | POA: Diagnosis not present

## 2018-12-12 ENCOUNTER — Telehealth: Payer: Self-pay | Admitting: Hematology & Oncology

## 2018-12-12 ENCOUNTER — Inpatient Hospital Stay (HOSPITAL_BASED_OUTPATIENT_CLINIC_OR_DEPARTMENT_OTHER): Payer: Medicare Other | Admitting: Hematology & Oncology

## 2018-12-12 ENCOUNTER — Other Ambulatory Visit: Payer: Self-pay

## 2018-12-12 ENCOUNTER — Inpatient Hospital Stay: Payer: Medicare Other | Attending: Hematology & Oncology

## 2018-12-12 ENCOUNTER — Encounter: Payer: Self-pay | Admitting: Hematology & Oncology

## 2018-12-12 VITALS — BP 128/80 | HR 67 | Temp 98.6°F | Resp 18 | Wt 177.0 lb

## 2018-12-12 DIAGNOSIS — Z9884 Bariatric surgery status: Secondary | ICD-10-CM | POA: Insufficient documentation

## 2018-12-12 DIAGNOSIS — Z79899 Other long term (current) drug therapy: Secondary | ICD-10-CM | POA: Insufficient documentation

## 2018-12-12 DIAGNOSIS — D508 Other iron deficiency anemias: Secondary | ICD-10-CM

## 2018-12-12 DIAGNOSIS — R7989 Other specified abnormal findings of blood chemistry: Secondary | ICD-10-CM

## 2018-12-12 DIAGNOSIS — K909 Intestinal malabsorption, unspecified: Secondary | ICD-10-CM | POA: Diagnosis not present

## 2018-12-12 DIAGNOSIS — Z950 Presence of cardiac pacemaker: Secondary | ICD-10-CM | POA: Insufficient documentation

## 2018-12-12 DIAGNOSIS — D500 Iron deficiency anemia secondary to blood loss (chronic): Secondary | ICD-10-CM

## 2018-12-12 DIAGNOSIS — D509 Iron deficiency anemia, unspecified: Secondary | ICD-10-CM | POA: Insufficient documentation

## 2018-12-12 DIAGNOSIS — E559 Vitamin D deficiency, unspecified: Secondary | ICD-10-CM

## 2018-12-12 DIAGNOSIS — D510 Vitamin B12 deficiency anemia due to intrinsic factor deficiency: Secondary | ICD-10-CM

## 2018-12-12 LAB — CBC WITH DIFFERENTIAL (CANCER CENTER ONLY)
Abs Immature Granulocytes: 0 10*3/uL (ref 0.00–0.07)
Basophils Absolute: 0 10*3/uL (ref 0.0–0.1)
Basophils Relative: 1 %
Eosinophils Absolute: 0.2 10*3/uL (ref 0.0–0.5)
Eosinophils Relative: 4 %
HCT: 32.1 % — ABNORMAL LOW (ref 36.0–46.0)
Hemoglobin: 10.4 g/dL — ABNORMAL LOW (ref 12.0–15.0)
Immature Granulocytes: 0 %
Lymphocytes Relative: 35 %
Lymphs Abs: 1.4 10*3/uL (ref 0.7–4.0)
MCH: 32 pg (ref 26.0–34.0)
MCHC: 32.4 g/dL (ref 30.0–36.0)
MCV: 98.8 fL (ref 80.0–100.0)
Monocytes Absolute: 0.4 10*3/uL (ref 0.1–1.0)
Monocytes Relative: 11 %
Neutro Abs: 1.9 10*3/uL (ref 1.7–7.7)
Neutrophils Relative %: 49 %
Platelet Count: 171 10*3/uL (ref 150–400)
RBC: 3.25 MIL/uL — ABNORMAL LOW (ref 3.87–5.11)
RDW: 13.1 % (ref 11.5–15.5)
WBC Count: 3.9 10*3/uL — ABNORMAL LOW (ref 4.0–10.5)
nRBC: 0 % (ref 0.0–0.2)

## 2018-12-12 LAB — RETICULOCYTES
Immature Retic Fract: 12 % (ref 2.3–15.9)
RBC.: 3.25 MIL/uL — ABNORMAL LOW (ref 3.87–5.11)
Retic Count, Absolute: 43.2 10*3/uL (ref 19.0–186.0)
Retic Ct Pct: 1.3 % (ref 0.4–3.1)

## 2018-12-12 LAB — IRON AND TIBC
Iron: 91 ug/dL (ref 41–142)
Saturation Ratios: 40 % (ref 21–57)
TIBC: 226 ug/dL — ABNORMAL LOW (ref 236–444)
UIBC: 135 ug/dL (ref 120–384)

## 2018-12-12 LAB — VITAMIN B12: Vitamin B-12: 876 pg/mL (ref 180–914)

## 2018-12-12 LAB — FERRITIN: Ferritin: 146 ng/mL (ref 11–307)

## 2018-12-12 NOTE — Telephone Encounter (Signed)
Spoke with patient to confirm Nov appts per 8/17 los

## 2018-12-12 NOTE — Progress Notes (Signed)
Hematology and Oncology Follow Up Visit  Kimberly Ramos [id number] 07-28-1970 48 y.o. 12/12/2018   Principle Diagnosis:  Iron deficiencyanemiasecondary to malabsorption (gastric sleeve 2018) Hyperhomocystinemia   Current Therapy:   IV iron as indicated - last received inJanuary2019 Folic acid 1 mg PO daily   Interim History:  Kimberly Ramos is here today for follow-up.  So far, she is doing fairly well.  She now has a Facilities manager in place.  This is put in in early July.  This was done at [facility].  She does have hyperhomocystinemia.  Her homocystine level was only 17.  She is on folic acid.  She is very worried about the coronavirus.  She is really protecting herself.  I totally understand why.  I think she were to get the coronavirus and it attacked her heart, this would really pose a lot of problems for her.  We last saw her 6 months ago, there studies showed a ferritin of 161 and iron saturation 28%.    She has had no problems with cough.  No rashes.  There is no leg swelling.  She has had no change in bowel or bladder habits.  Overall, her performance status is ECOG 0.    Medications:  Allergies as of 12/12/2018      Reactions   Ciprofloxacin Hives, Swelling   Lisinopril Hives, Itching   Face swelling  Face swelling  Face swelling  Face swelling  Face swelling  Face swelling  Face swelling  Face swelling  Face swelling  Face swelling       Medication List       Accurate as of December 12, 2018  9:09 AM. If you have any questions, ask your nurse or doctor.        STOP taking these medications   metroNIDAZOLE 0.75 % vaginal gel Commonly known as: METROGEL Stopped by: Josph Macho, MD   Olopatadine HCl 0.2 % Soln Commonly known as: Pataday Stopped by: Josph Macho, MD   OneTouch Verio test strip Generic drug: glucose blood Stopped by: Josph Macho, MD     TAKE these medications   albuterol 108 (90 Base) MCG/ACT inhaler  Commonly known as: VENTOLIN HFA Inhale into the lungs.   albuterol 1.25 MG/3ML nebulizer solution Commonly known as: ACCUNEB PLEASE SEE ATTACHED FOR DETAILED DIRECTIONS   apixaban 5 MG Tabs tablet Commonly known as: ELIQUIS Take 5 mg by mouth 2 (two) times daily.   atorvastatin 80 MG tablet Commonly known as: LIPITOR Take 80 mg by mouth at bedtime.   Azelastine HCl 0.15 % Soln Place 1 spray into the nose 2 (two) times daily as needed.   Budesonide 90 MCG/ACT inhaler Inhale into the lungs.   carvedilol 6.25 MG tablet Commonly known as: COREG Take 6.25 mg by mouth 2 (two) times daily.   cetirizine 10 MG tablet Commonly known as: ZYRTEC Take by mouth daily.   Clever Choice Tens Unit Devi TENS Unit (Flex IT or Flex MT Plus) and Supplies (Dx: Lumbar radiculopathy [M54.16])  .   diclofenac sodium 1 % Gel Commonly known as: VOLTAREN Apply topically.   EPINEPHrine 0.3 mg/0.3 mL Soaj injection Commonly known as: EPI-PEN Use as directed for severe allergic reaction.   esomeprazole 40 MG capsule Commonly known as: NEXIUM TAKE 1 CAPSULE TWICE DAILY 30 MINUTES PRIOR TO BREAKFAST AND DINNER.   ferrous sulfate 325 (65 FE) MG EC tablet Take 325 mg by mouth daily.   ferrous sulfate 325 (65 FE)  MG tablet Take 325 mg by mouth daily.   fluticasone 44 MCG/ACT inhaler Commonly known as: FLOVENT HFA INHALE 2 PUFFS EVERY DAY   folic acid 1 MG tablet Commonly known as: FOLVITE Take 1 tablet (1 mg total) by mouth daily.   furosemide 40 MG tablet Commonly known as: LASIX Take 40 mg by mouth as needed.   gabapentin 300 MG capsule Commonly known as: NEURONTIN Take 600 mg by mouth at bedtime.   HYDROcodone-acetaminophen 7.5-325 MG tablet Commonly known as: NORCO Take by mouth.   losartan 50 MG tablet Commonly known as: COZAAR   magnesium oxide 400 MG tablet Commonly known as: MAG-OX Take by mouth.   Multi-Vitamin tablet Take 1 tablet by mouth daily.   nitroGLYCERIN  0.4 MG SL tablet Commonly known as: NITROSTAT Place under the tongue.   potassium chloride 10 MEQ CR capsule Commonly known as: MICRO-K Take by mouth.   sitaGLIPtin-metformin 50-500 MG tablet Commonly known as: JANUMET Take by mouth 2 (two) times daily with a meal.   spironolactone 25 MG tablet Commonly known as: ALDACTONE Take 12.5 mg by mouth daily.   ticagrelor 90 MG Tabs tablet Commonly known as: BRILINTA TAKE 1 TABLET (90 MG TOTAL) BY MOUTH 2 (TWO) TIMES DAILY.   tiZANidine 2 MG tablet Commonly known as: ZANAFLEX Take by mouth once.   Vitamin D3 1.25 MG (50000 UT) Caps 50,000 Units once a week.       Allergies:  Allergies  Allergen Reactions  . Ciprofloxacin Hives and Swelling  . Lisinopril Hives and Itching    Face swelling  Face swelling  Face swelling  Face swelling  Face swelling  Face swelling  Face swelling  Face swelling  Face swelling  Face swelling      Past Medical History, Surgical history, Social history, and Family History were reviewed and updated.  Review of Systems: Review of Systems  Constitutional: Negative.   HENT: Negative.   Eyes: Negative.   Respiratory: Negative.  Negative for cough.   Cardiovascular: Negative.   Gastrointestinal: Negative.   Genitourinary: Negative.   Musculoskeletal: Negative.   Skin: Negative.   Neurological: Negative.   Endo/Heme/Allergies: Negative.   Psychiatric/Behavioral: Negative.      Physical Exam:  vitals were not taken for this visit.   Wt Readings from Last 3 Encounters:  05/24/18 182 lb 12 oz (82.9 kg)  03/29/18 194 lb 4 oz (88.1 kg)  02/14/18 209 lb (94.8 kg)    Physical Exam Vitals signs reviewed.  HENT:     Head: Normocephalic and atraumatic.  Eyes:     Pupils: Pupils are equal, round, and reactive to light.  Neck:     Musculoskeletal: Normal range of motion.  Cardiovascular:     Rate and Rhythm: Normal rate and regular rhythm.     Heart sounds: Normal heart sounds.   Pulmonary:     Effort: Pulmonary effort is normal.     Breath sounds: Normal breath sounds.  Abdominal:     General: Bowel sounds are normal.     Palpations: Abdomen is soft.  Musculoskeletal: Normal range of motion.        General: No tenderness or deformity.  Lymphadenopathy:     Cervical: No cervical adenopathy.  Skin:    General: Skin is warm and dry.     Findings: No erythema or rash.  Neurological:     Mental Status: She is alert and oriented to person, place, and time.  Psychiatric:  Behavior: Behavior normal.        Thought Content: Thought content normal.        Judgment: Judgment normal.      Lab Results  Component Value Date   WBC 3.9 (L) 12/12/2018   HGB 10.4 (L) 12/12/2018   HCT 32.1 (L) 12/12/2018   MCV 98.8 12/12/2018   PLT 171 12/12/2018   Lab Results  Component Value Date   FERRITIN 161 05/24/2018   IRON 69 05/24/2018   TIBC 246 05/24/2018   UIBC 177 05/24/2018   IRONPCTSAT 28 05/24/2018   Lab Results  Component Value Date   RETICCTPCT 1.3 12/12/2018   RBC 3.25 (L) 12/12/2018   RBC 3.25 (L) 12/12/2018   No results found for: KPAFRELGTCHN, LAMBDASER, KAPLAMBRATIO No results found for: IGGSERUM, IGA, IGMSERUM No results found for: TOTALPROTELP, ALBUMINELP, A1GS, A2GS, BETS, BETA2SER, GAMS, MSPIKE, SPEI   Chemistry      Component Value Date/Time   NA 146 (H) 01/18/2017 1310   K 4.8 (H) 01/18/2017 1310   CL 107 01/18/2017 1310   CO2 27 01/18/2017 1310   BUN 21 01/18/2017 1310   CREATININE 1.4 (H) 01/18/2017 1310      Component Value Date/Time   CALCIUM 9.2 01/18/2017 1310   ALKPHOS 100 (H) 01/18/2017 1310   AST 22 01/18/2017 1310   ALT 19 01/18/2017 1310   BILITOT 0.60 01/18/2017 1310      Impression and Plan: Kimberly Ramos is a very pleasant 48 yo African American female with iron deficiency anemia secondary to malabsorption after gastric bypass in 2018.  Her hemoglobin is down a little bit today.  We will have to see what her iron  studies show.  Hopefully, the defibrillator/pacemaker will provide protection for her.  I just feel that she has isolated so so much.  She likes to exercise and not been able to do this has been tough on her.  I will then have her come back in about 3 months.  Like to make sure we get her in before the holiday season starts so we can make sure that her blood is optimal.   Volanda Napoleon, MD 8/17/20209:09 AM

## 2018-12-13 LAB — HOMOCYSTEINE: Homocysteine: 18.3 umol/L — ABNORMAL HIGH (ref 0.0–14.5)

## 2018-12-13 LAB — VITAMIN D 25 HYDROXY (VIT D DEFICIENCY, FRACTURES): Vit D, 25-Hydroxy: 44.3 ng/mL (ref 30.0–100.0)

## 2018-12-14 ENCOUNTER — Telehealth: Payer: Self-pay | Admitting: *Deleted

## 2018-12-14 NOTE — Telephone Encounter (Signed)
Notified pt of results 

## 2018-12-14 NOTE — Telephone Encounter (Signed)
-----   Message from Volanda Napoleon, MD sent at 12/14/2018  1:13 PM EDT ----- Call -homocysteine is still high. Increase the folic acid to 2 mg po q day.

## 2018-12-19 DIAGNOSIS — M545 Low back pain: Secondary | ICD-10-CM | POA: Diagnosis not present

## 2018-12-23 DIAGNOSIS — M791 Myalgia, unspecified site: Secondary | ICD-10-CM | POA: Diagnosis not present

## 2018-12-28 DIAGNOSIS — Z955 Presence of coronary angioplasty implant and graft: Secondary | ICD-10-CM | POA: Diagnosis not present

## 2018-12-28 DIAGNOSIS — Z9884 Bariatric surgery status: Secondary | ICD-10-CM | POA: Diagnosis not present

## 2018-12-28 DIAGNOSIS — Z8673 Personal history of transient ischemic attack (TIA), and cerebral infarction without residual deficits: Secondary | ICD-10-CM | POA: Diagnosis not present

## 2018-12-28 DIAGNOSIS — Z87898 Personal history of other specified conditions: Secondary | ICD-10-CM | POA: Diagnosis not present

## 2018-12-28 DIAGNOSIS — E7211 Homocystinuria: Secondary | ICD-10-CM | POA: Diagnosis not present

## 2019-01-03 DIAGNOSIS — E782 Mixed hyperlipidemia: Secondary | ICD-10-CM | POA: Diagnosis not present

## 2019-01-03 DIAGNOSIS — E7211 Homocystinuria: Secondary | ICD-10-CM | POA: Diagnosis not present

## 2019-01-03 DIAGNOSIS — I5022 Chronic systolic (congestive) heart failure: Secondary | ICD-10-CM | POA: Diagnosis not present

## 2019-01-03 DIAGNOSIS — I10 Essential (primary) hypertension: Secondary | ICD-10-CM | POA: Diagnosis not present

## 2019-01-03 DIAGNOSIS — Z955 Presence of coronary angioplasty implant and graft: Secondary | ICD-10-CM | POA: Diagnosis not present

## 2019-01-03 DIAGNOSIS — I214 Non-ST elevation (NSTEMI) myocardial infarction: Secondary | ICD-10-CM | POA: Diagnosis not present

## 2019-01-03 DIAGNOSIS — I2510 Atherosclerotic heart disease of native coronary artery without angina pectoris: Secondary | ICD-10-CM | POA: Diagnosis not present

## 2019-01-03 DIAGNOSIS — G4733 Obstructive sleep apnea (adult) (pediatric): Secondary | ICD-10-CM | POA: Diagnosis not present

## 2019-01-06 DIAGNOSIS — I10 Essential (primary) hypertension: Secondary | ICD-10-CM | POA: Diagnosis not present

## 2019-01-06 DIAGNOSIS — B49 Unspecified mycosis: Secondary | ICD-10-CM | POA: Diagnosis not present

## 2019-01-06 DIAGNOSIS — E1165 Type 2 diabetes mellitus with hyperglycemia: Secondary | ICD-10-CM | POA: Diagnosis not present

## 2019-01-06 DIAGNOSIS — N183 Chronic kidney disease, stage 3 (moderate): Secondary | ICD-10-CM | POA: Diagnosis not present

## 2019-01-06 DIAGNOSIS — E782 Mixed hyperlipidemia: Secondary | ICD-10-CM | POA: Diagnosis not present

## 2019-01-10 ENCOUNTER — Other Ambulatory Visit: Payer: Self-pay | Admitting: Family

## 2019-01-16 DIAGNOSIS — I2510 Atherosclerotic heart disease of native coronary artery without angina pectoris: Secondary | ICD-10-CM | POA: Diagnosis not present

## 2019-01-16 DIAGNOSIS — Z95810 Presence of automatic (implantable) cardiac defibrillator: Secondary | ICD-10-CM | POA: Diagnosis not present

## 2019-01-16 DIAGNOSIS — E119 Type 2 diabetes mellitus without complications: Secondary | ICD-10-CM | POA: Diagnosis not present

## 2019-01-16 DIAGNOSIS — Z7984 Long term (current) use of oral hypoglycemic drugs: Secondary | ICD-10-CM | POA: Diagnosis not present

## 2019-01-16 DIAGNOSIS — I5022 Chronic systolic (congestive) heart failure: Secondary | ICD-10-CM | POA: Diagnosis not present

## 2019-01-20 DIAGNOSIS — H524 Presbyopia: Secondary | ICD-10-CM | POA: Diagnosis not present

## 2019-01-20 DIAGNOSIS — Z7984 Long term (current) use of oral hypoglycemic drugs: Secondary | ICD-10-CM | POA: Diagnosis not present

## 2019-01-20 DIAGNOSIS — H52203 Unspecified astigmatism, bilateral: Secondary | ICD-10-CM | POA: Diagnosis not present

## 2019-01-20 DIAGNOSIS — H5213 Myopia, bilateral: Secondary | ICD-10-CM | POA: Diagnosis not present

## 2019-01-20 DIAGNOSIS — E119 Type 2 diabetes mellitus without complications: Secondary | ICD-10-CM | POA: Diagnosis not present

## 2019-01-23 DIAGNOSIS — M791 Myalgia, unspecified site: Secondary | ICD-10-CM | POA: Diagnosis not present

## 2019-01-30 DIAGNOSIS — Z4502 Encounter for adjustment and management of automatic implantable cardiac defibrillator: Secondary | ICD-10-CM | POA: Diagnosis not present

## 2019-01-30 DIAGNOSIS — Z955 Presence of coronary angioplasty implant and graft: Secondary | ICD-10-CM | POA: Diagnosis not present

## 2019-01-30 DIAGNOSIS — Z95810 Presence of automatic (implantable) cardiac defibrillator: Secondary | ICD-10-CM | POA: Diagnosis not present

## 2019-01-30 DIAGNOSIS — I472 Ventricular tachycardia: Secondary | ICD-10-CM | POA: Diagnosis not present

## 2019-01-30 DIAGNOSIS — I252 Old myocardial infarction: Secondary | ICD-10-CM | POA: Diagnosis not present

## 2019-02-10 DIAGNOSIS — I2510 Atherosclerotic heart disease of native coronary artery without angina pectoris: Secondary | ICD-10-CM | POA: Diagnosis not present

## 2019-02-10 DIAGNOSIS — I10 Essential (primary) hypertension: Secondary | ICD-10-CM | POA: Diagnosis not present

## 2019-02-10 DIAGNOSIS — J449 Chronic obstructive pulmonary disease, unspecified: Secondary | ICD-10-CM | POA: Diagnosis not present

## 2019-02-10 DIAGNOSIS — E1165 Type 2 diabetes mellitus with hyperglycemia: Secondary | ICD-10-CM | POA: Diagnosis not present

## 2019-02-10 DIAGNOSIS — E782 Mixed hyperlipidemia: Secondary | ICD-10-CM | POA: Diagnosis not present

## 2019-02-10 DIAGNOSIS — N183 Chronic kidney disease, stage 3 unspecified: Secondary | ICD-10-CM | POA: Diagnosis not present

## 2019-02-20 DIAGNOSIS — G4733 Obstructive sleep apnea (adult) (pediatric): Secondary | ICD-10-CM | POA: Diagnosis not present

## 2019-02-20 DIAGNOSIS — I110 Hypertensive heart disease with heart failure: Secondary | ICD-10-CM | POA: Diagnosis not present

## 2019-02-20 DIAGNOSIS — I5022 Chronic systolic (congestive) heart failure: Secondary | ICD-10-CM | POA: Diagnosis not present

## 2019-02-20 DIAGNOSIS — E785 Hyperlipidemia, unspecified: Secondary | ICD-10-CM | POA: Diagnosis not present

## 2019-02-20 DIAGNOSIS — I2510 Atherosclerotic heart disease of native coronary artery without angina pectoris: Secondary | ICD-10-CM | POA: Diagnosis not present

## 2019-02-22 DIAGNOSIS — M791 Myalgia, unspecified site: Secondary | ICD-10-CM | POA: Diagnosis not present

## 2019-03-09 DIAGNOSIS — I5022 Chronic systolic (congestive) heart failure: Secondary | ICD-10-CM | POA: Diagnosis not present

## 2019-03-09 DIAGNOSIS — Z049 Encounter for examination and observation for unspecified reason: Secondary | ICD-10-CM | POA: Diagnosis not present

## 2019-03-09 DIAGNOSIS — J449 Chronic obstructive pulmonary disease, unspecified: Secondary | ICD-10-CM | POA: Diagnosis not present

## 2019-03-13 ENCOUNTER — Inpatient Hospital Stay: Payer: Medicare Other

## 2019-03-13 ENCOUNTER — Other Ambulatory Visit: Payer: Self-pay | Admitting: *Deleted

## 2019-03-13 ENCOUNTER — Other Ambulatory Visit: Payer: Self-pay

## 2019-03-13 ENCOUNTER — Telehealth: Payer: Self-pay | Admitting: Family

## 2019-03-13 ENCOUNTER — Telehealth: Payer: Self-pay | Admitting: *Deleted

## 2019-03-13 ENCOUNTER — Inpatient Hospital Stay: Payer: Medicare Other | Attending: Hematology & Oncology | Admitting: Family

## 2019-03-13 VITALS — BP 107/61 | HR 62 | Temp 96.6°F | Resp 16 | Wt 164.2 lb

## 2019-03-13 DIAGNOSIS — D509 Iron deficiency anemia, unspecified: Secondary | ICD-10-CM | POA: Insufficient documentation

## 2019-03-13 DIAGNOSIS — D500 Iron deficiency anemia secondary to blood loss (chronic): Secondary | ICD-10-CM

## 2019-03-13 DIAGNOSIS — Z9884 Bariatric surgery status: Secondary | ICD-10-CM | POA: Diagnosis not present

## 2019-03-13 DIAGNOSIS — D508 Other iron deficiency anemias: Secondary | ICD-10-CM | POA: Diagnosis not present

## 2019-03-13 DIAGNOSIS — K912 Postsurgical malabsorption, not elsewhere classified: Secondary | ICD-10-CM | POA: Insufficient documentation

## 2019-03-13 DIAGNOSIS — E875 Hyperkalemia: Secondary | ICD-10-CM

## 2019-03-13 LAB — CMP (CANCER CENTER ONLY)
ALT: 28 U/L (ref 0–44)
AST: 29 U/L (ref 15–41)
Albumin: 4.4 g/dL (ref 3.5–5.0)
Alkaline Phosphatase: 139 U/L — ABNORMAL HIGH (ref 38–126)
Anion gap: 8 (ref 5–15)
BUN: 23 mg/dL — ABNORMAL HIGH (ref 6–20)
CO2: 28 mmol/L (ref 22–32)
Calcium: 10.2 mg/dL (ref 8.9–10.3)
Chloride: 105 mmol/L (ref 98–111)
Creatinine: 1.5 mg/dL — ABNORMAL HIGH (ref 0.44–1.00)
GFR, Est AFR Am: 47 mL/min — ABNORMAL LOW (ref >60)
GFR, Estimated: 41 mL/min — ABNORMAL LOW (ref >60)
Glucose, Bld: 176 mg/dL — ABNORMAL HIGH (ref 70–99)
Potassium: 6.4 mmol/L (ref 3.5–5.1)
Sodium: 141 mmol/L (ref 135–145)
Total Bilirubin: 0.6 mg/dL (ref 0.3–1.2)
Total Protein: 7.4 g/dL (ref 6.5–8.1)

## 2019-03-13 LAB — CBC WITH DIFFERENTIAL (CANCER CENTER ONLY)
Abs Immature Granulocytes: 0.01 10*3/uL (ref 0.00–0.07)
Basophils Absolute: 0 10*3/uL (ref 0.0–0.1)
Basophils Relative: 1 %
Eosinophils Absolute: 0.3 10*3/uL (ref 0.0–0.5)
Eosinophils Relative: 6 %
HCT: 37.5 % (ref 36.0–46.0)
Hemoglobin: 12 g/dL (ref 12.0–15.0)
Immature Granulocytes: 0 %
Lymphocytes Relative: 35 %
Lymphs Abs: 1.5 10*3/uL (ref 0.7–4.0)
MCH: 31.8 pg (ref 26.0–34.0)
MCHC: 32 g/dL (ref 30.0–36.0)
MCV: 99.5 fL (ref 80.0–100.0)
Monocytes Absolute: 0.5 10*3/uL (ref 0.1–1.0)
Monocytes Relative: 12 %
Neutro Abs: 2 10*3/uL (ref 1.7–7.7)
Neutrophils Relative %: 46 %
Platelet Count: 274 10*3/uL (ref 150–400)
RBC: 3.77 MIL/uL — ABNORMAL LOW (ref 3.87–5.11)
RDW: 13.6 % (ref 11.5–15.5)
WBC Count: 4.3 10*3/uL (ref 4.0–10.5)
nRBC: 0 % (ref 0.0–0.2)

## 2019-03-13 LAB — RETICULOCYTES
Immature Retic Fract: 9.6 % (ref 2.3–15.9)
RBC.: 3.72 MIL/uL — ABNORMAL LOW (ref 3.87–5.11)
Retic Count, Absolute: 56.9 10*3/uL (ref 19.0–186.0)
Retic Ct Pct: 1.5 % (ref 0.4–3.1)

## 2019-03-13 LAB — IRON AND TIBC
Iron: 109 ug/dL (ref 41–142)
Saturation Ratios: 39 % (ref 21–57)
TIBC: 278 ug/dL (ref 236–444)
UIBC: 169 ug/dL (ref 120–384)

## 2019-03-13 LAB — FERRITIN: Ferritin: 203 ng/mL (ref 11–307)

## 2019-03-13 NOTE — Progress Notes (Addendum)
Hematology and Oncology Follow Up Visit  Kimberly Ramos [id number] 1970-08-22 48 y.o. 03/13/2019   Principle Diagnosis:  Iron deficiencyanemiasecondary to malabsorption (gastric sleeve 2018) Hyperhomocystinemia  Current Therapy:   IV iron as indicated  Folic acid 1 mg PO daily   Interim History:  [name] is here today for follow-up. She is doing well and states that she can tell her energy is improved. She has been more active lately.  She has an IUD in place and rarely spots. She has not noted any other blood loss. No bruising or petechiae.  No fever, chills, n/v, cough, rash, dizziness, SOB, chest pain, palpitations, abdominal pain or changes in bowel or bladder habits.  She takes lasix as needed for any swelling in her lower extremities. None present at this time.  No tenderness, numbness or tingling in her extremities.  No falls or syncope.  She has maintained a good appetite and is staying well hydrated. Her weight is stable.   ECOG Performance Status: 0 - Asymptomatic  Medications:  Allergies as of 03/13/2019      Reactions   Ciprofloxacin Hives, Swelling   Lisinopril Hives, Itching   Face swelling  Face swelling  Face swelling  Face swelling  Face swelling  Face swelling  Face swelling  Face swelling  Face swelling  Face swelling       Medication List       Accurate as of March 13, 2019  9:11 AM. If you have any questions, ask your nurse or doctor.        albuterol 108 (90 Base) MCG/ACT inhaler Commonly known as: VENTOLIN HFA Inhale into the lungs.   albuterol 1.25 MG/3ML nebulizer solution Commonly known as: ACCUNEB PLEASE SEE ATTACHED FOR DETAILED DIRECTIONS   apixaban 5 MG Tabs tablet Commonly known as: ELIQUIS Take 5 mg by mouth 2 (two) times daily.   atorvastatin 80 MG tablet Commonly known as: LIPITOR Take 80 mg by mouth at bedtime.   Azelastine HCl 0.15 % Soln Place 1 spray into the nose 2 (two) times daily as needed.    Budesonide 90 MCG/ACT inhaler Inhale into the lungs.   carvedilol 6.25 MG tablet Commonly known as: COREG Take 6.25 mg by mouth 2 (two) times daily.   cetirizine 10 MG tablet Commonly known as: ZYRTEC Take by mouth daily.   Clever Choice Tens Unit Devi TENS Unit (Flex IT or Flex MT Plus) and Supplies (Dx: Lumbar radiculopathy [M54.16])  .   diclofenac sodium 1 % Gel Commonly known as: VOLTAREN Apply topically.   EPINEPHrine 0.3 mg/0.3 mL Soaj injection Commonly known as: EPI-PEN Use as directed for severe allergic reaction.   esomeprazole 40 MG capsule Commonly known as: NEXIUM TAKE 1 CAPSULE TWICE DAILY 30 MINUTES PRIOR TO BREAKFAST AND DINNER.   ferrous sulfate 325 (65 FE) MG EC tablet Take 325 mg by mouth daily.   ferrous sulfate 325 (65 FE) MG tablet Take 325 mg by mouth daily.   fluticasone 44 MCG/ACT inhaler Commonly known as: FLOVENT HFA INHALE 2 PUFFS EVERY DAY   folic acid 1 MG tablet Commonly known as: FOLVITE Take 1 tablet (1 mg total) by mouth daily.   furosemide 40 MG tablet Commonly known as: LASIX Take 40 mg by mouth as needed.   gabapentin 300 MG capsule Commonly known as: NEURONTIN Take 600 mg by mouth at bedtime.   HYDROcodone-acetaminophen 7.5-325 MG tablet Commonly known as: NORCO Take by mouth.   losartan 50 MG tablet Commonly known  as: COZAAR   magnesium oxide 400 MG tablet Commonly known as: MAG-OX Take by mouth.   Multi-Vitamin tablet Take 1 tablet by mouth daily.   nitroGLYCERIN 0.4 MG SL tablet Commonly known as: NITROSTAT Place under the tongue.   potassium chloride 10 MEQ CR capsule Commonly known as: MICRO-K Take by mouth.   sitaGLIPtin-metformin 50-500 MG tablet Commonly known as: JANUMET Take by mouth 2 (two) times daily with a meal.   spironolactone 25 MG tablet Commonly known as: ALDACTONE Take 12.5 mg by mouth daily.   ticagrelor 90 MG Tabs tablet Commonly known as: BRILINTA TAKE 1 TABLET (90 MG TOTAL)  BY MOUTH 2 (TWO) TIMES DAILY.   tiZANidine 2 MG tablet Commonly known as: ZANAFLEX Take by mouth once.   Vitamin D3 1.25 MG (50000 UT) Caps 50,000 Units once a week.       Allergies:  Allergies  Allergen Reactions  . Ciprofloxacin Hives and Swelling  . Lisinopril Hives and Itching    Face swelling  Face swelling  Face swelling  Face swelling  Face swelling  Face swelling  Face swelling  Face swelling  Face swelling  Face swelling      Past Medical History, Surgical history, Social history, and Family History were reviewed and updated.  Review of Systems: All other 10 point review of systems is negative.   Physical Exam:  vitals were not taken for this visit.   Wt Readings from Last 3 Encounters:  12/12/18 177 lb (80.3 kg)  05/24/18 182 lb 12 oz (82.9 kg)  03/29/18 194 lb 4 oz (88.1 kg)    Ocular: Sclerae unicteric, pupils equal, round and reactive to light Ear-nose-throat: Oropharynx clear, dentition fair Lymphatic: No cervical or supraclavicular adenopathy Lungs no rales or rhonchi, good excursion bilaterally Heart regular rate and rhythm, no murmur appreciated Abd soft, nontender, positive bowel sounds, no liver or spleen tip palpated on exam, no fluid wave  MSK no focal spinal tenderness, no joint edema Neuro: non-focal, well-oriented, appropriate affect Breasts: Deferred   Lab Results  Component Value Date   WBC 4.3 03/13/2019   HGB 12.0 03/13/2019   HCT 37.5 03/13/2019   MCV 99.5 03/13/2019   PLT 274 03/13/2019   Lab Results  Component Value Date   FERRITIN 146 12/12/2018   IRON 91 12/12/2018   TIBC 226 (L) 12/12/2018   UIBC 135 12/12/2018   IRONPCTSAT 40 12/12/2018   Lab Results  Component Value Date   RETICCTPCT 1.5 03/13/2019   RBC 3.77 (L) 03/13/2019   No results found for: KPAFRELGTCHN, LAMBDASER, KAPLAMBRATIO No results found for: IGGSERUM, IGA, IGMSERUM No results found for: TOTALPROTELP, ALBUMINELP, A1GS, A2GS, BETS, BETA2SER,  GAMS, MSPIKE, SPEI   Chemistry      Component Value Date/Time   NA 146 (H) 01/18/2017 1310   K 4.8 (H) 01/18/2017 1310   CL 107 01/18/2017 1310   CO2 27 01/18/2017 1310   BUN 21 01/18/2017 1310   CREATININE 1.4 (H) 01/18/2017 1310      Component Value Date/Time   CALCIUM 9.2 01/18/2017 1310   ALKPHOS 100 (H) 01/18/2017 1310   AST 22 01/18/2017 1310   ALT 19 01/18/2017 1310   BILITOT 0.60 01/18/2017 1310       Impression and Plan: Ms. Barrales is a very pleasant 48 yo African American female with iron deficiency anemia secondary to malabsorption after gastric bypass in 2018. We will see what her iron studies show and bring her back in for infusion if  needed.  We will plan to see her back in another 3 months.  She will contact our office with any questions or concerns. We can certainly see him sooner if needed.   Emeline Gins, NP 11/16/20209:11 AM    Addendum: Patient's potassium came back high at 6.9. She is asymptomatic at this time. She will stop her supplement and come back in tomorrow from repeat lab work.

## 2019-03-13 NOTE — Telephone Encounter (Signed)
Called patient per request by Laverna Peace to let patient know her Potassium level was 6.7.  Patient is currently not taking potassium.  Patient scheduled to retest her potassium tomorrow per Judson Roch request.  Orders place and appt made.

## 2019-03-13 NOTE — Telephone Encounter (Signed)
Appointments scheduled    Calendar printed per 11/19 los

## 2019-03-14 ENCOUNTER — Other Ambulatory Visit: Payer: Medicare Other

## 2019-03-14 ENCOUNTER — Inpatient Hospital Stay: Payer: Medicare Other

## 2019-03-14 DIAGNOSIS — D509 Iron deficiency anemia, unspecified: Secondary | ICD-10-CM | POA: Diagnosis not present

## 2019-03-14 DIAGNOSIS — E875 Hyperkalemia: Secondary | ICD-10-CM

## 2019-03-14 DIAGNOSIS — Z9884 Bariatric surgery status: Secondary | ICD-10-CM | POA: Diagnosis not present

## 2019-03-14 DIAGNOSIS — K912 Postsurgical malabsorption, not elsewhere classified: Secondary | ICD-10-CM | POA: Diagnosis not present

## 2019-03-14 LAB — BASIC METABOLIC PANEL - CANCER CENTER ONLY
Anion gap: 6 (ref 5–15)
BUN: 21 mg/dL — ABNORMAL HIGH (ref 6–20)
CO2: 27 mmol/L (ref 22–32)
Calcium: 9.4 mg/dL (ref 8.9–10.3)
Chloride: 106 mmol/L (ref 98–111)
Creatinine: 1.41 mg/dL — ABNORMAL HIGH (ref 0.44–1.00)
GFR, Est AFR Am: 51 mL/min — ABNORMAL LOW (ref >60)
GFR, Estimated: 44 mL/min — ABNORMAL LOW (ref >60)
Glucose, Bld: 203 mg/dL — ABNORMAL HIGH (ref 70–99)
Potassium: 5.6 mmol/L — ABNORMAL HIGH (ref 3.5–5.1)
Sodium: 139 mmol/L (ref 135–145)

## 2019-03-16 DIAGNOSIS — J449 Chronic obstructive pulmonary disease, unspecified: Secondary | ICD-10-CM | POA: Diagnosis not present

## 2019-03-16 DIAGNOSIS — Z23 Encounter for immunization: Secondary | ICD-10-CM | POA: Diagnosis not present

## 2019-03-25 DIAGNOSIS — M791 Myalgia, unspecified site: Secondary | ICD-10-CM | POA: Diagnosis not present

## 2019-03-30 DIAGNOSIS — I2510 Atherosclerotic heart disease of native coronary artery without angina pectoris: Secondary | ICD-10-CM | POA: Diagnosis not present

## 2019-03-30 DIAGNOSIS — I5022 Chronic systolic (congestive) heart failure: Secondary | ICD-10-CM | POA: Diagnosis not present

## 2019-03-30 DIAGNOSIS — G4733 Obstructive sleep apnea (adult) (pediatric): Secondary | ICD-10-CM | POA: Diagnosis not present

## 2019-03-30 DIAGNOSIS — I10 Essential (primary) hypertension: Secondary | ICD-10-CM | POA: Diagnosis not present

## 2019-03-30 DIAGNOSIS — J4520 Mild intermittent asthma, uncomplicated: Secondary | ICD-10-CM | POA: Diagnosis not present

## 2019-03-30 DIAGNOSIS — I272 Pulmonary hypertension, unspecified: Secondary | ICD-10-CM | POA: Diagnosis not present

## 2019-03-30 DIAGNOSIS — D509 Iron deficiency anemia, unspecified: Secondary | ICD-10-CM | POA: Diagnosis not present

## 2019-03-30 DIAGNOSIS — E785 Hyperlipidemia, unspecified: Secondary | ICD-10-CM | POA: Diagnosis not present

## 2019-03-30 DIAGNOSIS — E119 Type 2 diabetes mellitus without complications: Secondary | ICD-10-CM | POA: Diagnosis not present

## 2019-04-10 DIAGNOSIS — Z01812 Encounter for preprocedural laboratory examination: Secondary | ICD-10-CM | POA: Diagnosis not present

## 2019-04-10 DIAGNOSIS — G4733 Obstructive sleep apnea (adult) (pediatric): Secondary | ICD-10-CM | POA: Diagnosis not present

## 2019-04-14 DIAGNOSIS — Z049 Encounter for examination and observation for unspecified reason: Secondary | ICD-10-CM | POA: Diagnosis not present

## 2019-04-14 DIAGNOSIS — J449 Chronic obstructive pulmonary disease, unspecified: Secondary | ICD-10-CM | POA: Diagnosis not present

## 2019-04-17 DIAGNOSIS — G4733 Obstructive sleep apnea (adult) (pediatric): Secondary | ICD-10-CM | POA: Diagnosis not present

## 2019-04-24 DIAGNOSIS — M791 Myalgia, unspecified site: Secondary | ICD-10-CM | POA: Diagnosis not present

## 2019-04-25 DIAGNOSIS — G4733 Obstructive sleep apnea (adult) (pediatric): Secondary | ICD-10-CM | POA: Diagnosis not present

## 2019-05-01 DIAGNOSIS — Z9884 Bariatric surgery status: Secondary | ICD-10-CM | POA: Diagnosis not present

## 2019-05-01 DIAGNOSIS — G479 Sleep disorder, unspecified: Secondary | ICD-10-CM | POA: Diagnosis not present

## 2019-05-12 DIAGNOSIS — Z95810 Presence of automatic (implantable) cardiac defibrillator: Secondary | ICD-10-CM | POA: Diagnosis not present

## 2019-05-16 DIAGNOSIS — J449 Chronic obstructive pulmonary disease, unspecified: Secondary | ICD-10-CM | POA: Diagnosis not present

## 2019-05-16 DIAGNOSIS — Z049 Encounter for examination and observation for unspecified reason: Secondary | ICD-10-CM | POA: Diagnosis not present

## 2019-05-17 DIAGNOSIS — Z124 Encounter for screening for malignant neoplasm of cervix: Secondary | ICD-10-CM | POA: Diagnosis not present

## 2019-05-17 DIAGNOSIS — Z8619 Personal history of other infectious and parasitic diseases: Secondary | ICD-10-CM | POA: Diagnosis not present

## 2019-05-25 DIAGNOSIS — M791 Myalgia, unspecified site: Secondary | ICD-10-CM | POA: Diagnosis not present

## 2019-05-30 DIAGNOSIS — D508 Other iron deficiency anemias: Secondary | ICD-10-CM | POA: Diagnosis not present

## 2019-05-30 DIAGNOSIS — E7211 Homocystinuria: Secondary | ICD-10-CM | POA: Diagnosis not present

## 2019-05-30 DIAGNOSIS — Z0000 Encounter for general adult medical examination without abnormal findings: Secondary | ICD-10-CM | POA: Diagnosis not present

## 2019-05-30 DIAGNOSIS — E785 Hyperlipidemia, unspecified: Secondary | ICD-10-CM | POA: Diagnosis not present

## 2019-05-30 DIAGNOSIS — Z9884 Bariatric surgery status: Secondary | ICD-10-CM | POA: Diagnosis not present

## 2019-05-30 DIAGNOSIS — E119 Type 2 diabetes mellitus without complications: Secondary | ICD-10-CM | POA: Diagnosis not present

## 2019-05-30 DIAGNOSIS — I2510 Atherosclerotic heart disease of native coronary artery without angina pectoris: Secondary | ICD-10-CM | POA: Diagnosis not present

## 2019-05-30 DIAGNOSIS — I4901 Ventricular fibrillation: Secondary | ICD-10-CM | POA: Diagnosis not present

## 2019-05-30 DIAGNOSIS — I10 Essential (primary) hypertension: Secondary | ICD-10-CM | POA: Diagnosis not present

## 2019-05-30 DIAGNOSIS — E559 Vitamin D deficiency, unspecified: Secondary | ICD-10-CM | POA: Diagnosis not present

## 2019-05-30 DIAGNOSIS — I472 Ventricular tachycardia: Secondary | ICD-10-CM | POA: Diagnosis not present

## 2019-05-30 DIAGNOSIS — I5022 Chronic systolic (congestive) heart failure: Secondary | ICD-10-CM | POA: Diagnosis not present

## 2019-05-30 DIAGNOSIS — I110 Hypertensive heart disease with heart failure: Secondary | ICD-10-CM | POA: Diagnosis not present

## 2019-05-31 DIAGNOSIS — I10 Essential (primary) hypertension: Secondary | ICD-10-CM | POA: Diagnosis not present

## 2019-05-31 DIAGNOSIS — I2510 Atherosclerotic heart disease of native coronary artery without angina pectoris: Secondary | ICD-10-CM | POA: Diagnosis not present

## 2019-06-02 DIAGNOSIS — M6701 Short Achilles tendon (acquired), right ankle: Secondary | ICD-10-CM | POA: Diagnosis not present

## 2019-06-02 DIAGNOSIS — M722 Plantar fascial fibromatosis: Secondary | ICD-10-CM | POA: Diagnosis not present

## 2019-06-08 DIAGNOSIS — Z124 Encounter for screening for malignant neoplasm of cervix: Secondary | ICD-10-CM | POA: Diagnosis not present

## 2019-06-08 DIAGNOSIS — Z8673 Personal history of transient ischemic attack (TIA), and cerebral infarction without residual deficits: Secondary | ICD-10-CM | POA: Diagnosis not present

## 2019-06-08 DIAGNOSIS — E7211 Homocystinuria: Secondary | ICD-10-CM | POA: Diagnosis not present

## 2019-06-13 ENCOUNTER — Encounter: Payer: Self-pay | Admitting: Family

## 2019-06-13 ENCOUNTER — Inpatient Hospital Stay: Payer: Medicare Other | Attending: Hematology & Oncology

## 2019-06-13 ENCOUNTER — Inpatient Hospital Stay (HOSPITAL_BASED_OUTPATIENT_CLINIC_OR_DEPARTMENT_OTHER): Payer: Medicare Other | Admitting: Family

## 2019-06-13 ENCOUNTER — Telehealth: Payer: Self-pay | Admitting: Nurse Practitioner

## 2019-06-13 ENCOUNTER — Telehealth: Payer: Self-pay | Admitting: Family

## 2019-06-13 ENCOUNTER — Other Ambulatory Visit: Payer: Self-pay

## 2019-06-13 VITALS — BP 117/50 | HR 59 | Temp 97.1°F | Resp 18 | Ht 66.0 in | Wt 157.0 lb

## 2019-06-13 DIAGNOSIS — D500 Iron deficiency anemia secondary to blood loss (chronic): Secondary | ICD-10-CM

## 2019-06-13 DIAGNOSIS — R7989 Other specified abnormal findings of blood chemistry: Secondary | ICD-10-CM

## 2019-06-13 DIAGNOSIS — K909 Intestinal malabsorption, unspecified: Secondary | ICD-10-CM | POA: Insufficient documentation

## 2019-06-13 DIAGNOSIS — Z9884 Bariatric surgery status: Secondary | ICD-10-CM | POA: Insufficient documentation

## 2019-06-13 DIAGNOSIS — D509 Iron deficiency anemia, unspecified: Secondary | ICD-10-CM | POA: Diagnosis not present

## 2019-06-13 DIAGNOSIS — D508 Other iron deficiency anemias: Secondary | ICD-10-CM

## 2019-06-13 LAB — CBC WITH DIFFERENTIAL (CANCER CENTER ONLY)
Abs Immature Granulocytes: 0.03 10*3/uL (ref 0.00–0.07)
Basophils Absolute: 0 10*3/uL (ref 0.0–0.1)
Basophils Relative: 0 %
Eosinophils Absolute: 0.2 10*3/uL (ref 0.0–0.5)
Eosinophils Relative: 4 %
HCT: 33.9 % — ABNORMAL LOW (ref 36.0–46.0)
Hemoglobin: 11.3 g/dL — ABNORMAL LOW (ref 12.0–15.0)
Immature Granulocytes: 1 %
Lymphocytes Relative: 33 %
Lymphs Abs: 1.5 10*3/uL (ref 0.7–4.0)
MCH: 31.8 pg (ref 26.0–34.0)
MCHC: 33.3 g/dL (ref 30.0–36.0)
MCV: 95.5 fL (ref 80.0–100.0)
Monocytes Absolute: 0.5 10*3/uL (ref 0.1–1.0)
Monocytes Relative: 11 %
Neutro Abs: 2.4 10*3/uL (ref 1.7–7.7)
Neutrophils Relative %: 51 %
Platelet Count: 211 10*3/uL (ref 150–400)
RBC: 3.55 MIL/uL — ABNORMAL LOW (ref 3.87–5.11)
RDW: 14.6 % (ref 11.5–15.5)
WBC Count: 4.6 10*3/uL (ref 4.0–10.5)
nRBC: 0 % (ref 0.0–0.2)

## 2019-06-13 LAB — RETICULOCYTES
Immature Retic Fract: 17.3 % — ABNORMAL HIGH (ref 2.3–15.9)
RBC.: 3.51 MIL/uL — ABNORMAL LOW (ref 3.87–5.11)
Retic Count, Absolute: 56.5 10*3/uL (ref 19.0–186.0)
Retic Ct Pct: 1.6 % (ref 0.4–3.1)

## 2019-06-13 NOTE — Progress Notes (Signed)
Hematology and Oncology Follow Up Visit  Kimberly Ramos [id number] [birth year] 48 y.o. 06/13/2019   Principle Diagnosis:  Iron deficiencyanemiasecondary to malabsorption (gastric sleeve 2018) Hyperhomocystinemia  Current Therapy:   IV iron as indicated  Folic acid 1 mg PO daily   Interim History:  Kimberly Ramos is here today for follow-up. She is doing well but does not fatigue and occasional ice cravings.  Her PCP checked her lab work on 05/30/2019, ferritin 227 and iron saturation 39%, homocystine was 17.7.  Her gynecologist recently saw her for bleeding and spotting with her IUD. She still has her IUD in place and is spotting. She had a transvaginal US which was negative. She states that if se continues to bleed her gynecologist plans to remove the IUD and biopsy her uterus.  She denies any other blood loss. No bruising or petechiae.  No fever, chills, n/v, cough, rash, dizziness, SOB, chest pain, palpitations, abdominal pain ro changes in bowel or bladder habits.  No swelling, tenderness, numbness or tingling in her extremities.  She is walking some for exercise. No falls or syncopal episodes to report.  She states that her appetite comes and goes. She is hydrating well. Her weight is stable.   ECOG Performance Status: 1 - Symptomatic but completely ambulatory  Medications:  Allergies as of 06/13/2019      Reactions   Ciprofloxacin Hives, Swelling   Lisinopril Hives, Itching   Face swelling  Face swelling  Face swelling  Face swelling  Face swelling  Face swelling  Face swelling  Face swelling  Face swelling  Face swelling       Medication List       Accurate as of June 13, 2019  9:10 AM. If you have any questions, ask your nurse or doctor.        albuterol 108 (90 Base) MCG/ACT inhaler Commonly known as: VENTOLIN HFA Inhale into the lungs.   albuterol 1.25 MG/3ML nebulizer solution Commonly known as: ACCUNEB PLEASE SEE ATTACHED FOR DETAILED DIRECTIONS    apixaban 5 MG Tabs tablet Commonly known as: ELIQUIS Take 5 mg by mouth 2 (two) times daily.   atorvastatin 80 MG tablet Commonly known as: LIPITOR Take 80 mg by mouth at bedtime.   Budesonide 90 MCG/ACT inhaler Inhale into the lungs.   carvedilol 6.25 MG tablet Commonly known as: COREG Take 6.25 mg by mouth 2 (two) times daily.   cetirizine 10 MG tablet Commonly known as: ZYRTEC Take by mouth daily.   Clever Choice Tens Unit Devi TENS Unit (Flex IT or Flex MT Plus) and Supplies (Dx: Lumbar radiculopathy [M54.16])  .   diclofenac sodium 1 % Gel Commonly known as: VOLTAREN Apply topically.   EPINEPHrine 0.3 mg/0.3 mL Soaj injection Commonly known as: EPI-PEN Use as directed for severe allergic reaction.   esomeprazole 40 MG capsule Commonly known as: NEXIUM TAKE 1 CAPSULE TWICE DAILY 30 MINUTES PRIOR TO BREAKFAST AND DINNER.   ferrous sulfate 325 (65 FE) MG EC tablet Take 325 mg by mouth daily.   fluticasone 44 MCG/ACT inhaler Commonly known as: FLOVENT HFA INHALE 2 PUFFS EVERY DAY   folic acid 1 MG tablet Commonly known as: FOLVITE Take 1 tablet (1 mg total) by mouth daily.   furosemide 40 MG tablet Commonly known as: LASIX Take 40 mg by mouth as needed.   gabapentin 300 MG capsule Commonly known as: NEURONTIN Take 600 mg by mouth at bedtime.   losartan 50 MG tablet Commonly known as: COZAAR  magnesium oxide 400 MG tablet Commonly known as: MAG-OX Take by mouth.   Multi-Vitamin tablet Take 1 tablet by mouth daily.   nitroGLYCERIN 0.4 MG SL tablet Commonly known as: NITROSTAT Place under the tongue.   potassium chloride 10 MEQ CR capsule Commonly known as: MICRO-K Take by mouth.   sertraline 25 MG tablet Commonly known as: ZOLOFT Take by mouth.   sitaGLIPtin-metformin 50-500 MG tablet Commonly known as: JANUMET Take by mouth 2 (two) times daily with a meal.   spironolactone 25 MG tablet Commonly known as: ALDACTONE Take 12.5 mg by  mouth daily.   ticagrelor 90 MG Tabs tablet Commonly known as: BRILINTA TAKE 1 TABLET (90 MG TOTAL) BY MOUTH 2 (TWO) TIMES DAILY.   tiZANidine 2 MG tablet Commonly known as: ZANAFLEX Take by mouth once.       Allergies:  Allergies  Allergen Reactions  . Ciprofloxacin Hives and Swelling  . Lisinopril Hives and Itching    Face swelling  Face swelling  Face swelling  Face swelling  Face swelling  Face swelling  Face swelling  Face swelling  Face swelling  Face swelling      Past Medical History, Surgical history, Social history, and Family History were reviewed and updated.  Review of Systems: All other 10 point review of systems is negative.   Physical Exam:  vitals were not taken for this visit.   Wt Readings from Last 3 Encounters:  03/13/19 164 lb 4 oz (74.5 kg)  12/12/18 177 lb (80.3 kg)  05/24/18 182 lb 12 oz (82.9 kg)    Ocular: Sclerae unicteric, pupils equal, round and reactive to light Ear-nose-throat: Oropharynx clear, dentition fair Lymphatic: No cervical or supraclavicular adenopathy Lungs no rales or rhonchi, good excursion bilaterally Heart regular rate and rhythm, no murmur appreciated Abd soft, nontender, positive bowel sounds, no liver or spleen tip palpated on exam, no fluid wave  MSK no focal spinal tenderness, no joint edema Neuro: non-focal, well-oriented, appropriate affect Breasts: Deferred   Lab Results  Component Value Date   WBC 4.6 06/13/2019   HGB 11.3 (L) 06/13/2019   HCT 33.9 (L) 06/13/2019   MCV 95.5 06/13/2019   PLT 211 06/13/2019   Lab Results  Component Value Date   FERRITIN 203 03/13/2019   IRON 109 03/13/2019   TIBC 278 03/13/2019   UIBC 169 03/13/2019   IRONPCTSAT 39 03/13/2019   Lab Results  Component Value Date   RETICCTPCT 1.6 06/13/2019   RBC 3.55 (L) 06/13/2019   RBC 3.51 (L) 06/13/2019   No results found for: KPAFRELGTCHN, LAMBDASER, KAPLAMBRATIO No results found for: IGGSERUM, IGA, IGMSERUM No  results found for: Marda Stalker, SPEI   Chemistry      Component Value Date/Time   NA 139 03/14/2019 1342   NA 146 (H) 01/18/2017 1310   K 5.6 (H) 03/14/2019 1342   K 4.8 (H) 01/18/2017 1310   CL 106 03/14/2019 1342   CL 107 01/18/2017 1310   CO2 27 03/14/2019 1342   CO2 27 01/18/2017 1310   BUN 21 (H) 03/14/2019 1342   BUN 21 01/18/2017 1310   CREATININE 1.41 (H) 03/14/2019 1342   CREATININE 1.4 (H) 01/18/2017 1310      Component Value Date/Time   CALCIUM 9.4 03/14/2019 1342   CALCIUM 9.2 01/18/2017 1310   ALKPHOS 139 (H) 03/13/2019 0855   ALKPHOS 100 (H) 01/18/2017 1310   AST 29 03/13/2019 0855   ALT 28 03/13/2019 0855  ALT 19 01/18/2017 1310   BILITOT 0.6 03/13/2019 0855       Impression and Plan: Kimberly Ramos is a very pleasant 49yo African American female with iron deficiency anemia secondary to malabsorption after gastric bypass in 2018. Iron studies are stable. No infusion needed at this time.  We will see her again in another 3 months.  She will contact our office with any questions or concerns. We can certainly see her sooner if needed.   Laverna Peace, NP 2/16/20219:10 AM

## 2019-06-13 NOTE — Telephone Encounter (Signed)
Appointments scheduled calendar printed and patient was sent link to sign up for My Chart as well/ per 2/16 los

## 2019-06-14 DIAGNOSIS — J449 Chronic obstructive pulmonary disease, unspecified: Secondary | ICD-10-CM | POA: Diagnosis not present

## 2019-06-14 DIAGNOSIS — Z049 Encounter for examination and observation for unspecified reason: Secondary | ICD-10-CM | POA: Diagnosis not present

## 2019-06-25 DIAGNOSIS — M791 Myalgia, unspecified site: Secondary | ICD-10-CM | POA: Diagnosis not present

## 2019-06-29 ENCOUNTER — Other Ambulatory Visit: Payer: Self-pay | Admitting: Family

## 2019-06-29 DIAGNOSIS — I214 Non-ST elevation (NSTEMI) myocardial infarction: Secondary | ICD-10-CM

## 2019-06-29 DIAGNOSIS — I2510 Atherosclerotic heart disease of native coronary artery without angina pectoris: Secondary | ICD-10-CM | POA: Diagnosis not present

## 2019-06-29 DIAGNOSIS — D500 Iron deficiency anemia secondary to blood loss (chronic): Secondary | ICD-10-CM

## 2019-06-29 DIAGNOSIS — Z95810 Presence of automatic (implantable) cardiac defibrillator: Secondary | ICD-10-CM | POA: Diagnosis not present

## 2019-06-29 DIAGNOSIS — E119 Type 2 diabetes mellitus without complications: Secondary | ICD-10-CM | POA: Diagnosis not present

## 2019-06-29 DIAGNOSIS — I5022 Chronic systolic (congestive) heart failure: Secondary | ICD-10-CM | POA: Diagnosis not present

## 2019-06-29 DIAGNOSIS — I110 Hypertensive heart disease with heart failure: Secondary | ICD-10-CM | POA: Diagnosis not present

## 2019-06-29 MED ORDER — FOLIC ACID 1 MG PO TABS: 2.0000 mg | ORAL_TABLET | Freq: Every day | ORAL | 6 refills | Status: DC

## 2019-06-30 DIAGNOSIS — M722 Plantar fascial fibromatosis: Secondary | ICD-10-CM | POA: Diagnosis not present

## 2019-06-30 DIAGNOSIS — M6701 Short Achilles tendon (acquired), right ankle: Secondary | ICD-10-CM | POA: Diagnosis not present

## 2019-06-30 DIAGNOSIS — E1165 Type 2 diabetes mellitus with hyperglycemia: Secondary | ICD-10-CM | POA: Diagnosis not present

## 2019-06-30 DIAGNOSIS — Z1231 Encounter for screening mammogram for malignant neoplasm of breast: Secondary | ICD-10-CM | POA: Diagnosis not present

## 2019-07-11 DIAGNOSIS — R590 Localized enlarged lymph nodes: Secondary | ICD-10-CM | POA: Diagnosis not present

## 2019-07-11 DIAGNOSIS — R0989 Other specified symptoms and signs involving the circulatory and respiratory systems: Secondary | ICD-10-CM | POA: Diagnosis not present

## 2019-07-11 DIAGNOSIS — I2510 Atherosclerotic heart disease of native coronary artery without angina pectoris: Secondary | ICD-10-CM | POA: Diagnosis not present

## 2019-07-11 DIAGNOSIS — I700 Atherosclerosis of aorta: Secondary | ICD-10-CM | POA: Diagnosis not present

## 2019-07-12 DIAGNOSIS — Z049 Encounter for examination and observation for unspecified reason: Secondary | ICD-10-CM | POA: Diagnosis not present

## 2019-07-12 DIAGNOSIS — R945 Abnormal results of liver function studies: Secondary | ICD-10-CM | POA: Diagnosis not present

## 2019-07-12 DIAGNOSIS — J449 Chronic obstructive pulmonary disease, unspecified: Secondary | ICD-10-CM | POA: Diagnosis not present

## 2019-07-12 DIAGNOSIS — K219 Gastro-esophageal reflux disease without esophagitis: Secondary | ICD-10-CM | POA: Diagnosis not present

## 2019-07-13 ENCOUNTER — Telehealth: Payer: Self-pay | Admitting: *Deleted

## 2019-07-13 NOTE — Telephone Encounter (Signed)
Message received from patient stating that her prescription folic acid costs $25.00 and she would like to know if she can take folic acid 1600 mcg that she currently has at home.  Call placed back to patient and message left to notify patient that folic acid can be purchased OTC and that it would be best to take the prescribed dose of 2 mg.  Instructed pt to call office back with any questions or concerns.

## 2019-07-18 DIAGNOSIS — R7989 Other specified abnormal findings of blood chemistry: Secondary | ICD-10-CM | POA: Diagnosis not present

## 2019-07-18 DIAGNOSIS — K8020 Calculus of gallbladder without cholecystitis without obstruction: Secondary | ICD-10-CM | POA: Diagnosis not present

## 2019-07-18 DIAGNOSIS — R945 Abnormal results of liver function studies: Secondary | ICD-10-CM | POA: Diagnosis not present

## 2019-07-21 DIAGNOSIS — E785 Hyperlipidemia, unspecified: Secondary | ICD-10-CM | POA: Diagnosis not present

## 2019-07-21 DIAGNOSIS — Z9884 Bariatric surgery status: Secondary | ICD-10-CM | POA: Diagnosis not present

## 2019-07-21 DIAGNOSIS — E559 Vitamin D deficiency, unspecified: Secondary | ICD-10-CM | POA: Diagnosis not present

## 2019-07-23 DIAGNOSIS — M791 Myalgia, unspecified site: Secondary | ICD-10-CM | POA: Diagnosis not present

## 2019-07-26 DIAGNOSIS — R0989 Other specified symptoms and signs involving the circulatory and respiratory systems: Secondary | ICD-10-CM | POA: Diagnosis not present

## 2019-07-26 DIAGNOSIS — I69351 Hemiplegia and hemiparesis following cerebral infarction affecting right dominant side: Secondary | ICD-10-CM | POA: Diagnosis not present

## 2019-08-02 DIAGNOSIS — M545 Low back pain: Secondary | ICD-10-CM | POA: Diagnosis not present

## 2019-08-09 DIAGNOSIS — Z95810 Presence of automatic (implantable) cardiac defibrillator: Secondary | ICD-10-CM | POA: Diagnosis not present

## 2019-08-09 DIAGNOSIS — Z8673 Personal history of transient ischemic attack (TIA), and cerebral infarction without residual deficits: Secondary | ICD-10-CM | POA: Diagnosis not present

## 2019-08-09 DIAGNOSIS — Z87891 Personal history of nicotine dependence: Secondary | ICD-10-CM | POA: Diagnosis not present

## 2019-08-09 DIAGNOSIS — I255 Ischemic cardiomyopathy: Secondary | ICD-10-CM | POA: Diagnosis not present

## 2019-08-09 DIAGNOSIS — G4733 Obstructive sleep apnea (adult) (pediatric): Secondary | ICD-10-CM | POA: Diagnosis not present

## 2019-08-11 DIAGNOSIS — Z049 Encounter for examination and observation for unspecified reason: Secondary | ICD-10-CM | POA: Diagnosis not present

## 2019-08-11 DIAGNOSIS — J449 Chronic obstructive pulmonary disease, unspecified: Secondary | ICD-10-CM | POA: Diagnosis not present

## 2019-08-12 DIAGNOSIS — Z95810 Presence of automatic (implantable) cardiac defibrillator: Secondary | ICD-10-CM | POA: Diagnosis not present

## 2019-08-23 DIAGNOSIS — M791 Myalgia, unspecified site: Secondary | ICD-10-CM | POA: Diagnosis not present

## 2019-09-05 DIAGNOSIS — M5126 Other intervertebral disc displacement, lumbar region: Secondary | ICD-10-CM | POA: Diagnosis not present

## 2019-09-05 DIAGNOSIS — E782 Mixed hyperlipidemia: Secondary | ICD-10-CM | POA: Diagnosis not present

## 2019-09-05 DIAGNOSIS — M5136 Other intervertebral disc degeneration, lumbar region: Secondary | ICD-10-CM | POA: Diagnosis not present

## 2019-09-05 DIAGNOSIS — E1165 Type 2 diabetes mellitus with hyperglycemia: Secondary | ICD-10-CM | POA: Diagnosis not present

## 2019-09-05 DIAGNOSIS — G479 Sleep disorder, unspecified: Secondary | ICD-10-CM | POA: Diagnosis not present

## 2019-09-05 DIAGNOSIS — R945 Abnormal results of liver function studies: Secondary | ICD-10-CM | POA: Diagnosis not present

## 2019-09-11 ENCOUNTER — Inpatient Hospital Stay: Payer: Medicare Other

## 2019-09-11 ENCOUNTER — Inpatient Hospital Stay: Payer: Medicare Other | Admitting: Family

## 2019-09-11 DIAGNOSIS — J449 Chronic obstructive pulmonary disease, unspecified: Secondary | ICD-10-CM | POA: Diagnosis not present

## 2019-09-11 DIAGNOSIS — I5022 Chronic systolic (congestive) heart failure: Secondary | ICD-10-CM | POA: Diagnosis not present

## 2019-09-11 DIAGNOSIS — Z049 Encounter for examination and observation for unspecified reason: Secondary | ICD-10-CM | POA: Diagnosis not present

## 2019-09-18 DIAGNOSIS — M5416 Radiculopathy, lumbar region: Secondary | ICD-10-CM | POA: Diagnosis not present

## 2019-09-22 ENCOUNTER — Inpatient Hospital Stay: Payer: Medicare Other | Attending: Family

## 2019-09-22 ENCOUNTER — Encounter: Payer: Self-pay | Admitting: Family

## 2019-09-22 ENCOUNTER — Inpatient Hospital Stay (HOSPITAL_BASED_OUTPATIENT_CLINIC_OR_DEPARTMENT_OTHER): Payer: Medicare Other | Admitting: Family

## 2019-09-22 ENCOUNTER — Other Ambulatory Visit: Payer: Self-pay

## 2019-09-22 VITALS — BP 113/63 | HR 60 | Temp 97.1°F | Resp 20 | Wt 148.1 lb

## 2019-09-22 DIAGNOSIS — Z9884 Bariatric surgery status: Secondary | ICD-10-CM | POA: Insufficient documentation

## 2019-09-22 DIAGNOSIS — E119 Type 2 diabetes mellitus without complications: Secondary | ICD-10-CM | POA: Diagnosis not present

## 2019-09-22 DIAGNOSIS — D500 Iron deficiency anemia secondary to blood loss (chronic): Secondary | ICD-10-CM

## 2019-09-22 DIAGNOSIS — D509 Iron deficiency anemia, unspecified: Secondary | ICD-10-CM | POA: Diagnosis not present

## 2019-09-22 DIAGNOSIS — R7989 Other specified abnormal findings of blood chemistry: Secondary | ICD-10-CM

## 2019-09-22 DIAGNOSIS — M791 Myalgia, unspecified site: Secondary | ICD-10-CM | POA: Diagnosis not present

## 2019-09-22 DIAGNOSIS — K909 Intestinal malabsorption, unspecified: Secondary | ICD-10-CM | POA: Diagnosis not present

## 2019-09-22 DIAGNOSIS — Z79899 Other long term (current) drug therapy: Secondary | ICD-10-CM | POA: Diagnosis not present

## 2019-09-22 LAB — RETICULOCYTES
Immature Retic Fract: 21.1 % — ABNORMAL HIGH (ref 2.3–15.9)
RBC.: 3.47 MIL/uL — ABNORMAL LOW (ref 3.87–5.11)
Retic Count, Absolute: 62.5 10*3/uL (ref 19.0–186.0)
Retic Ct Pct: 1.8 % (ref 0.4–3.1)

## 2019-09-22 LAB — IRON AND TIBC
Iron: 59 ug/dL (ref 41–142)
Saturation Ratios: 22 % (ref 21–57)
TIBC: 271 ug/dL (ref 236–444)
UIBC: 212 ug/dL (ref 120–384)

## 2019-09-22 LAB — CBC WITH DIFFERENTIAL (CANCER CENTER ONLY)
Abs Immature Granulocytes: 0.06 10*3/uL (ref 0.00–0.07)
Basophils Absolute: 0 10*3/uL (ref 0.0–0.1)
Basophils Relative: 1 %
Eosinophils Absolute: 0.1 10*3/uL (ref 0.0–0.5)
Eosinophils Relative: 2 %
HCT: 34.9 % — ABNORMAL LOW (ref 36.0–46.0)
Hemoglobin: 11.3 g/dL — ABNORMAL LOW (ref 12.0–15.0)
Immature Granulocytes: 1 %
Lymphocytes Relative: 27 %
Lymphs Abs: 1.6 10*3/uL (ref 0.7–4.0)
MCH: 32.3 pg (ref 26.0–34.0)
MCHC: 32.4 g/dL (ref 30.0–36.0)
MCV: 99.7 fL (ref 80.0–100.0)
Monocytes Absolute: 0.9 10*3/uL (ref 0.1–1.0)
Monocytes Relative: 14 %
Neutro Abs: 3.3 10*3/uL (ref 1.7–7.7)
Neutrophils Relative %: 55 %
Platelet Count: 224 10*3/uL (ref 150–400)
RBC: 3.5 MIL/uL — ABNORMAL LOW (ref 3.87–5.11)
RDW: 13.9 % (ref 11.5–15.5)
WBC Count: 6 10*3/uL (ref 4.0–10.5)
nRBC: 0 % (ref 0.0–0.2)

## 2019-09-22 LAB — FERRITIN: Ferritin: 178 ng/mL (ref 11–307)

## 2019-09-22 NOTE — Progress Notes (Signed)
Hematology and Oncology Follow Up Visit  Kimberly Ramos [id number] 12/02/1970 49 y.o. 09/22/2019   Principle Diagnosis:  Iron deficiencyanemiasecondary to malabsorption (gastric sleeve 2018) Hyperhomocystinemia  Current Therapy:        IV iron as indicated  Folic acid 1 mg PO daily               Interim History:  Kimberly Ramos is here today for follow-up. She is doing well and has no complaints at this time.  She has an IUD in place and rarely notes spotting.  No episodes of abnormal bleeding. No bruising or petechiae.  No fever, chills, n/v, cough, rash, dizziness, SOB, chest pain, palpitations, abdominal pain or changes in bowel or bladder habits.  No swelling, tenderness, numbness or tingling in her extremities.  No falls or syncopal episodes to report.  She has maintained a good appetite and is staying well hydrated. Her weight is stable.   ECOG Performance Status: 1 - Symptomatic but completely ambulatory  Medications:  Allergies as of 09/22/2019      Reactions   Ciprofloxacin Hives, Swelling   Lisinopril Hives, Itching, Swelling   Face swelling      Medication List       Accurate as of Sep 22, 2019  8:49 AM. If you have any questions, ask your nurse or doctor.        albuterol 108 (90 Base) MCG/ACT inhaler Commonly known as: VENTOLIN HFA Inhale into the lungs.   albuterol 1.25 MG/3ML nebulizer solution Commonly known as: ACCUNEB PLEASE SEE ATTACHED FOR DETAILED DIRECTIONS   apixaban 5 MG Tabs tablet Commonly known as: ELIQUIS Take 5 mg by mouth 2 (two) times daily.   atorvastatin 80 MG tablet Commonly known as: LIPITOR Take 80 mg by mouth at bedtime.   Budesonide 90 MCG/ACT inhaler Inhale into the lungs.   carvedilol 6.25 MG tablet Commonly known as: COREG Take 6.25 mg by mouth 2 (two) times daily.   cetirizine 10 MG tablet Commonly known as: ZYRTEC Take by mouth daily.   Clever Choice Tens Unit Devi TENS Unit (Flex IT or Flex MT Plus) and  Supplies (Dx: Lumbar radiculopathy [M54.16])  .   diclofenac sodium 1 % Gel Commonly known as: VOLTAREN Apply topically.   EPINEPHrine 0.3 mg/0.3 mL Soaj injection Commonly known as: EPI-PEN Use as directed for severe allergic reaction.   esomeprazole 40 MG capsule Commonly known as: NEXIUM TAKE 1 CAPSULE TWICE DAILY 30 MINUTES PRIOR TO BREAKFAST AND DINNER.   ferrous sulfate 325 (65 FE) MG EC tablet Take 325 mg by mouth daily.   fluticasone 44 MCG/ACT inhaler Commonly known as: FLOVENT HFA INHALE 2 PUFFS EVERY DAY   folic acid 1 MG tablet Commonly known as: FOLVITE Take 2 tablets (2 mg total) by mouth daily.   furosemide 40 MG tablet Commonly known as: LASIX Take 40 mg by mouth as needed.   gabapentin 300 MG capsule Commonly known as: NEURONTIN Take 600 mg by mouth at bedtime.   hydrOXYzine 50 MG tablet Commonly known as: ATARAX/VISTARIL TAKE 1 2 TABLETS BY MOUTH AT BEDTIME FOR SLEEP   Jardiance 25 MG Tabs tablet Generic drug: empagliflozin Take 25 mg by mouth daily.   losartan 50 MG tablet Commonly known as: COZAAR   magnesium oxide 400 MG tablet Commonly known as: MAG-OX Take by mouth.   Multi-Vitamin tablet Take 1 tablet by mouth daily.   nitroGLYCERIN 0.4 MG SL tablet Commonly known as: NITROSTAT Place under the tongue.  sertraline 25 MG tablet Commonly known as: ZOLOFT Take by mouth.   spironolactone 25 MG tablet Commonly known as: ALDACTONE Take 12.5 mg by mouth daily.   ticagrelor 90 MG Tabs tablet Commonly known as: BRILINTA TAKE 1 TABLET (90 MG TOTAL) BY MOUTH 2 (TWO) TIMES DAILY.   tiZANidine 2 MG tablet Commonly known as: ZANAFLEX Take by mouth once.       Allergies:  Allergies  Allergen Reactions  . Ciprofloxacin Hives and Swelling  . Lisinopril Hives, Itching and Swelling    Face swelling     Past Medical History, Surgical history, Social history, and Family History were reviewed and updated.  Review of Systems: All  other 10 point review of systems is negative.   Physical Exam:  vitals were not taken for this visit.   Wt Readings from Last 3 Encounters:  06/13/19 157 lb (71.2 kg)  03/13/19 164 lb 4 oz (74.5 kg)  12/12/18 177 lb (80.3 kg)    Ocular: Sclerae unicteric, pupils equal, round and reactive to light Ear-nose-throat: Oropharynx clear, dentition fair Lymphatic: No cervical or supraclavicular adenopathy Lungs no rales or rhonchi, good excursion bilaterally Heart regular rate and rhythm, no murmur appreciated Abd soft, nontender, positive bowel sounds, no liver or spleen tip palpated on exam, no fluid wave  MSK no focal spinal tenderness, no joint edema Neuro: non-focal, well-oriented, appropriate affect Breasts: Deferred   Lab Results  Component Value Date   WBC 4.6 06/13/2019   HGB 11.3 (L) 06/13/2019   HCT 33.9 (L) 06/13/2019   MCV 95.5 06/13/2019   PLT 211 06/13/2019   Lab Results  Component Value Date   FERRITIN 203 03/13/2019   IRON 109 03/13/2019   TIBC 278 03/13/2019   UIBC 169 03/13/2019   IRONPCTSAT 39 03/13/2019   Lab Results  Component Value Date   RETICCTPCT 1.6 06/13/2019   RBC 3.55 (L) 06/13/2019   RBC 3.51 (L) 06/13/2019   No results found for: KPAFRELGTCHN, LAMBDASER, KAPLAMBRATIO No results found for: IGGSERUM, IGA, IGMSERUM No results found for: Odetta Pink, SPEI   Chemistry      Component Value Date/Time   NA 139 03/14/2019 1342   NA 146 (H) 01/18/2017 1310   K 5.6 (H) 03/14/2019 1342   K 4.8 (H) 01/18/2017 1310   CL 106 03/14/2019 1342   CL 107 01/18/2017 1310   CO2 27 03/14/2019 1342   CO2 27 01/18/2017 1310   BUN 21 (H) 03/14/2019 1342   BUN 21 01/18/2017 1310   CREATININE 1.41 (H) 03/14/2019 1342   CREATININE 1.4 (H) 01/18/2017 1310      Component Value Date/Time   CALCIUM 9.4 03/14/2019 1342   CALCIUM 9.2 01/18/2017 1310   ALKPHOS 139 (H) 03/13/2019 0855   ALKPHOS 100 (H)  01/18/2017 1310   AST 29 03/13/2019 0855   ALT 28 03/13/2019 0855   ALT 19 01/18/2017 1310   BILITOT 0.6 03/13/2019 0855       Impression and Plan: Kimberly Ramos is a very pleasant 49yo African American female with iron deficiency anemia secondary to malabsorption after gastric bypass in 2018. We will see what her iron studies look like and replace if needed.  We will see her again in another 6 months.  She will contact our office with any questions or concerns. We can certainly see her sooner if needed.   Laverna Peace, NP 5/28/20218:49 AM

## 2019-09-23 LAB — HOMOCYSTEINE: Homocysteine: 18.2 umol/L — ABNORMAL HIGH (ref 0.0–14.5)

## 2020-01-22 ENCOUNTER — Other Ambulatory Visit: Payer: Self-pay

## 2020-03-26 ENCOUNTER — Encounter: Payer: Self-pay | Admitting: Family

## 2020-03-26 ENCOUNTER — Other Ambulatory Visit: Payer: Self-pay

## 2020-03-26 ENCOUNTER — Inpatient Hospital Stay: Payer: Medicare Other | Attending: Family | Admitting: Family

## 2020-03-26 ENCOUNTER — Inpatient Hospital Stay: Payer: Medicare Other

## 2020-03-26 ENCOUNTER — Telehealth: Payer: Self-pay

## 2020-03-26 VITALS — BP 132/72 | HR 65 | Temp 98.5°F | Resp 17 | Wt 151.8 lb

## 2020-03-26 DIAGNOSIS — D500 Iron deficiency anemia secondary to blood loss (chronic): Secondary | ICD-10-CM | POA: Diagnosis present

## 2020-03-26 DIAGNOSIS — Z9884 Bariatric surgery status: Secondary | ICD-10-CM | POA: Diagnosis not present

## 2020-03-26 DIAGNOSIS — K909 Intestinal malabsorption, unspecified: Secondary | ICD-10-CM | POA: Diagnosis not present

## 2020-03-26 LAB — CBC WITH DIFFERENTIAL (CANCER CENTER ONLY)
Abs Immature Granulocytes: 0.01 10*3/uL (ref 0.00–0.07)
Basophils Absolute: 0 10*3/uL (ref 0.0–0.1)
Basophils Relative: 0 %
Eosinophils Absolute: 0.2 10*3/uL (ref 0.0–0.5)
Eosinophils Relative: 4 %
HCT: 32.3 % — ABNORMAL LOW (ref 36.0–46.0)
Hemoglobin: 10.9 g/dL — ABNORMAL LOW (ref 12.0–15.0)
Immature Granulocytes: 0 %
Lymphocytes Relative: 29 %
Lymphs Abs: 1.5 10*3/uL (ref 0.7–4.0)
MCH: 32.9 pg (ref 26.0–34.0)
MCHC: 33.7 g/dL (ref 30.0–36.0)
MCV: 97.6 fL (ref 80.0–100.0)
Monocytes Absolute: 0.5 10*3/uL (ref 0.1–1.0)
Monocytes Relative: 9 %
Neutro Abs: 3 10*3/uL (ref 1.7–7.7)
Neutrophils Relative %: 58 %
Platelet Count: 174 10*3/uL (ref 150–400)
RBC: 3.31 MIL/uL — ABNORMAL LOW (ref 3.87–5.11)
RDW: 13.7 % (ref 11.5–15.5)
WBC Count: 5.1 10*3/uL (ref 4.0–10.5)
nRBC: 0 % (ref 0.0–0.2)

## 2020-03-26 LAB — IRON AND TIBC
Iron: 57 ug/dL (ref 41–142)
Saturation Ratios: 19 % — ABNORMAL LOW (ref 21–57)
TIBC: 296 ug/dL (ref 236–444)
UIBC: 240 ug/dL (ref 120–384)

## 2020-03-26 LAB — RETICULOCYTES
Immature Retic Fract: 12.6 % (ref 2.3–15.9)
RBC.: 3.28 MIL/uL — ABNORMAL LOW (ref 3.87–5.11)
Retic Count, Absolute: 42 10*3/uL (ref 19.0–186.0)
Retic Ct Pct: 1.3 % (ref 0.4–3.1)

## 2020-03-26 LAB — FERRITIN: Ferritin: 133 ng/mL (ref 11–307)

## 2020-03-26 NOTE — Telephone Encounter (Signed)
appts made and printed for pt per 03/26/20 los ,,,, AOM

## 2020-03-26 NOTE — Progress Notes (Signed)
Hematology and Oncology Follow Up Visit  Kimberly Ramos [id number] 10/22/1970 49 y.o. 03/26/2020   Principle Diagnosis:  Iron deficiencyanemiasecondary to malabsorption (gastric sleeve 2018) Hyperhomocystinemia  Current Therapy: IV iron as indicated  Folic acid 1 mg PO daily               Interim History:  Kimberly Ramos is here today for follow-up. She is sdoing well and has no complaints at this time.  She still has her IUD and denies any break through bleeding or spotting.  No abnormal bruising, no petechiae.  No fever, chills, n/v, cough, rash, dizziness, SOB, chest pain, palpitations, abdominal pain or changes in bowel or bladder habits.  No swelling, tenderness, numbness or tingling in her extremities at this time.  No falls or syncope.  She has maintained a good appetite and is staying well hydrated. Her weight is stable.   ECOG Performance Status: 1 - Symptomatic but completely ambulatory  Medications:  Allergies as of 03/26/2020      Reactions   Ciprofloxacin Hives, Swelling   Lisinopril Hives, Itching, Swelling   Face swelling      Medication List       Accurate as of March 26, 2020  9:16 AM. If you have any questions, ask your nurse or doctor.        albuterol 108 (90 Base) MCG/ACT inhaler Commonly known as: VENTOLIN HFA Inhale into the lungs.   albuterol 1.25 MG/3ML nebulizer solution Commonly known as: ACCUNEB PLEASE SEE ATTACHED FOR DETAILED DIRECTIONS   apixaban 5 MG Tabs tablet Commonly known as: ELIQUIS Take 5 mg by mouth 2 (two) times daily.   Eliquis 5 MG Tabs tablet Generic drug: apixaban Take 1 tablet by mouth 2 (two) times daily.   atorvastatin 80 MG tablet Commonly known as: LIPITOR Take 80 mg by mouth at bedtime.   Budesonide 90 MCG/ACT inhaler Inhale into the lungs.   carvedilol 6.25 MG tablet Commonly known as: COREG Take 6.25 mg by mouth 2 (two) times daily.   Clever Choice Tens Unit Devi TENS Unit  (Flex IT or Flex MT Plus) and Supplies (Dx: Lumbar radiculopathy [M54.16])  .   diclofenac sodium 1 % Gel Commonly known as: VOLTAREN Apply topically.   digoxin 0.125 MG tablet Commonly known as: LANOXIN Take 125 mcg by mouth every other day.   digoxin 0.125 MG tablet Commonly known as: LANOXIN Take by mouth.   EPINEPHrine 0.3 mg/0.3 mL Soaj injection Commonly known as: EPI-PEN Use as directed for severe allergic reaction.   esomeprazole 40 MG capsule Commonly known as: NEXIUM TAKE 1 CAPSULE TWICE DAILY 30 MINUTES PRIOR TO BREAKFAST AND DINNER.   ferrous sulfate 325 (65 FE) MG EC tablet Take 325 mg by mouth daily.   fexofenadine 180 MG tablet Commonly known as: ALLEGRA Take by mouth.   fluticasone 44 MCG/ACT inhaler Commonly known as: FLOVENT HFA INHALE 2 PUFFS EVERY DAY   folic acid 1 MG tablet Commonly known as: FOLVITE Take 2 tablets (2 mg total) by mouth daily.   folic acid 400 MCG tablet Commonly known as: FOLVITE Take by mouth.   furosemide 40 MG tablet Commonly known as: LASIX Take 40 mg by mouth as needed.   gabapentin 300 MG capsule Commonly known as: NEURONTIN Take 600 mg by mouth at bedtime.   glipiZIDE 5 MG 24 hr tablet Commonly known as: GLUCOTROL XL Take by mouth.   hydrALAZINE 10 MG tablet Commonly known as: APRESOLINE Take by mouth.  losartan 50 MG tablet Commonly known as: COZAAR   losartan 100 MG tablet Commonly known as: COZAAR Take 100 mg by mouth daily.   magnesium oxide 400 MG tablet Commonly known as: MAG-OX Take by mouth.   Multi-Vitamin tablet Take 1 tablet by mouth daily.   nitroGLYCERIN 0.4 MG SL tablet Commonly known as: NITROSTAT Place under the tongue.   olopatadine 0.1 % ophthalmic solution Commonly known as: PATANOL 1 drop 2 times daily.   OneTouch Delica Lancets 33G Misc SMARTSIG:1 Strip(s) Topical Daily   OneTouch Verio test strip Generic drug: glucose blood daily. for testing   sertraline 25 MG  tablet Commonly known as: ZOLOFT Take by mouth.   sertraline 100 MG tablet Commonly known as: ZOLOFT Take 100 mg by mouth daily.   spironolactone 25 MG tablet Commonly known as: ALDACTONE Take 12.5 mg by mouth daily.   Synjardy XR 12.08-998 MG Tb24 Generic drug: Empagliflozin-metFORMIN HCl ER Take 1 tablet by mouth 2 (two) times daily.   ticagrelor 90 MG Tabs tablet Commonly known as: BRILINTA TAKE 1 TABLET (90 MG TOTAL) BY MOUTH 2 (TWO) TIMES DAILY.   tiZANidine 2 MG tablet Commonly known as: ZANAFLEX Take by mouth once.   tiZANidine 4 MG tablet Commonly known as: ZANAFLEX Take 2-4 mg by mouth 2 (two) times daily as needed.       Allergies:  Allergies  Allergen Reactions  . Ciprofloxacin Hives and Swelling  . Lisinopril Hives, Itching and Swelling    Face swelling     Past Medical History, Surgical history, Social history, and Family History were reviewed and updated.  Review of Systems: All other 10 point review of systems is negative.   Physical Exam:  weight is 151 lb 12.8 oz (68.9 kg). Her oral temperature is 98.5 F (36.9 C). Her blood pressure is 132/72 and her pulse is 65. Her respiration is 17 and oxygen saturation is 100%.   Wt Readings from Last 3 Encounters:  03/26/20 151 lb 12.8 oz (68.9 kg)  09/22/19 148 lb 1.3 oz (67.2 kg)  06/13/19 157 lb (71.2 kg)    Ocular: Sclerae unicteric, pupils equal, round and reactive to light Ear-nose-throat: Oropharynx clear, dentition fair Lymphatic: No cervical or supraclavicular adenopathy Lungs no rales or rhonchi, good excursion bilaterally Heart regular rate and rhythm, no murmur appreciated Abd soft, nontender, positive bowel sounds MSK no focal spinal tenderness, no joint edema Neuro: non-focal, well-oriented, appropriate affect Breasts: Deferred   Lab Results  Component Value Date   WBC 5.1 03/26/2020   HGB 10.9 (L) 03/26/2020   HCT 32.3 (L) 03/26/2020   MCV 97.6 03/26/2020   PLT 174 03/26/2020    Lab Results  Component Value Date   FERRITIN 178 09/22/2019   IRON 59 09/22/2019   TIBC 271 09/22/2019   UIBC 212 09/22/2019   IRONPCTSAT 22 09/22/2019   Lab Results  Component Value Date   RETICCTPCT 1.3 03/26/2020   RBC 3.28 (L) 03/26/2020   No results found for: KPAFRELGTCHN, LAMBDASER, KAPLAMBRATIO No results found for: IGGSERUM, IGA, IGMSERUM No results found for: Dorene Ar, A1GS, A2GS, Colin Benton, MSPIKE, SPEI   Chemistry      Component Value Date/Time   NA 139 03/14/2019 1342   NA 146 (H) 01/18/2017 1310   K 5.6 (H) 03/14/2019 1342   K 4.8 (H) 01/18/2017 1310   CL 106 03/14/2019 1342   CL 107 01/18/2017 1310   CO2 27 03/14/2019 1342   CO2 27 01/18/2017 1310  BUN 21 (H) 03/14/2019 1342   BUN 21 01/18/2017 1310   CREATININE 1.41 (H) 03/14/2019 1342   CREATININE 1.4 (H) 01/18/2017 1310      Component Value Date/Time   CALCIUM 9.4 03/14/2019 1342   CALCIUM 9.2 01/18/2017 1310   ALKPHOS 139 (H) 03/13/2019 0855   ALKPHOS 100 (H) 01/18/2017 1310   AST 29 03/13/2019 0855   ALT 28 03/13/2019 0855   ALT 19 01/18/2017 1310   BILITOT 0.6 03/13/2019 0855       Impression and Plan: Kimberly Ramos is a very pleasant 49yo African American female with iron deficiency anemia secondary to malabsorption after gastric bypass in 2018. Iron studies are pending. We will replace if needed.  Follow-up in 6 months.  She was encouraged to contact our office with any questions or concerns.   Emeline Gins, NP 11/30/20219:16 AM

## 2020-03-27 ENCOUNTER — Telehealth: Payer: Self-pay | Admitting: Family

## 2020-03-27 ENCOUNTER — Other Ambulatory Visit: Payer: Self-pay | Admitting: Family

## 2020-03-27 NOTE — Telephone Encounter (Signed)
Called and spoke with patient regarding iron infusion appointments that were scheduled.  She was in agreement with both dates & times per 12/1 sch msg

## 2020-04-03 ENCOUNTER — Other Ambulatory Visit: Payer: Self-pay

## 2020-04-03 ENCOUNTER — Inpatient Hospital Stay: Payer: Medicare Other | Attending: Hematology & Oncology

## 2020-04-03 VITALS — BP 107/68 | HR 67 | Temp 98.2°F | Resp 16

## 2020-04-03 DIAGNOSIS — Z9884 Bariatric surgery status: Secondary | ICD-10-CM | POA: Diagnosis not present

## 2020-04-03 DIAGNOSIS — K909 Intestinal malabsorption, unspecified: Secondary | ICD-10-CM | POA: Diagnosis not present

## 2020-04-03 DIAGNOSIS — D500 Iron deficiency anemia secondary to blood loss (chronic): Secondary | ICD-10-CM | POA: Insufficient documentation

## 2020-04-03 MED ORDER — SODIUM CHLORIDE 0.9 % IV SOLN
200.0000 mg | Freq: Once | INTRAVENOUS | Status: AC
  Administered 2020-04-03: 200 mg via INTRAVENOUS
  Filled 2020-04-03: qty 200

## 2020-04-03 MED ORDER — SODIUM CHLORIDE 0.9 % IV SOLN
Freq: Once | INTRAVENOUS | Status: AC
  Filled 2020-04-03: qty 250

## 2020-04-03 NOTE — Patient Instructions (Addendum)
Pt discharged in no apparent distress. Pt left ambulatory without assistance. Pt aware of discharge instructions and verbalized understanding and had no further questions.Iron Sucrose injection What is this medicine? IRON SUCROSE (AHY ern SOO krohs) is an iron complex. Iron is used to make healthy red blood cells, which carry oxygen and nutrients throughout the body. This medicine is used to treat iron deficiency anemia in people with chronic kidney disease. This medicine may be used for other purposes; ask your health care provider or pharmacist if you have questions. COMMON BRAND NAME(S): Venofer What should I tell my health care provider before I take this medicine? They need to know if you have any of these conditions:  anemia not caused by low iron levels  heart disease  high levels of iron in the blood  kidney disease  liver disease  an unusual or allergic reaction to iron, other medicines, foods, dyes, or preservatives  pregnant or trying to get pregnant  breast-feeding How should I use this medicine? This medicine is for infusion into a vein. It is given by a health care professional in a hospital or clinic setting. Talk to your pediatrician regarding the use of this medicine in children. While this drug may be prescribed for children as young as 2 years for selected conditions, precautions do apply. Overdosage: If you think you have taken too much of this medicine contact a poison control center or emergency room at once. NOTE: This medicine is only for you. Do not share this medicine with others. What if I miss a dose? It is important not to miss your dose. Call your doctor or health care professional if you are unable to keep an appointment. What may interact with this medicine? Do not take this medicine with any of the following medications:  deferoxamine  dimercaprol  other iron products This medicine may also interact with the following  medications:  chloramphenicol  deferasirox This list may not describe all possible interactions. Give your health care provider a list of all the medicines, herbs, non-prescription drugs, or dietary supplements you use. Also tell them if you smoke, drink alcohol, or use illegal drugs. Some items may interact with your medicine. What should I watch for while using this medicine? Visit your doctor or healthcare professional regularly. Tell your doctor or healthcare professional if your symptoms do not start to get better or if they get worse. You may need blood work done while you are taking this medicine. You may need to follow a special diet. Talk to your doctor. Foods that contain iron include: whole grains/cereals, dried fruits, beans, or peas, leafy green vegetables, and organ meats (liver, kidney). What side effects may I notice from receiving this medicine? Side effects that you should report to your doctor or health care professional as soon as possible:  allergic reactions like skin rash, itching or hives, swelling of the face, lips, or tongue  breathing problems  changes in blood pressure  cough  fast, irregular heartbeat  feeling faint or lightheaded, falls  fever or chills  flushing, sweating, or hot feelings  joint or muscle aches/pains  seizures  swelling of the ankles or feet  unusually weak or tired Side effects that usually do not require medical attention (report to your doctor or health care professional if they continue or are bothersome):  diarrhea  feeling achy  headache  irritation at site where injected  nausea, vomiting  stomach upset  tiredness This list may not describe all possible side   effects. Call your doctor for medical advice about side effects. You may report side effects to FDA at 1-800-FDA-1088. Where should I keep my medicine? This drug is given in a hospital or clinic and will not be stored at home. NOTE: This sheet is a  summary. It may not cover all possible information. If you have questions about this medicine, talk to your doctor, pharmacist, or health care provider.  2020 Elsevier/Gold Standard (2011-01-22 17:14:35)  

## 2020-04-11 ENCOUNTER — Inpatient Hospital Stay: Payer: Medicare Other

## 2020-04-11 ENCOUNTER — Other Ambulatory Visit: Payer: Self-pay

## 2020-04-11 VITALS — BP 128/77 | HR 64 | Temp 99.2°F | Resp 17

## 2020-04-11 DIAGNOSIS — D500 Iron deficiency anemia secondary to blood loss (chronic): Secondary | ICD-10-CM

## 2020-04-11 MED ORDER — SODIUM CHLORIDE 0.9 % IV SOLN
200.0000 mg | Freq: Once | INTRAVENOUS | Status: AC
  Administered 2020-04-11: 14:00:00 200 mg via INTRAVENOUS
  Filled 2020-04-11: qty 200

## 2020-04-11 MED ORDER — SODIUM CHLORIDE 0.9 % IV SOLN
Freq: Once | INTRAVENOUS | Status: AC
  Filled 2020-04-11: qty 250

## 2020-04-11 NOTE — Patient Instructions (Signed)

## 2020-09-24 ENCOUNTER — Inpatient Hospital Stay: Payer: Medicare Other | Attending: Family

## 2020-09-24 ENCOUNTER — Inpatient Hospital Stay (HOSPITAL_BASED_OUTPATIENT_CLINIC_OR_DEPARTMENT_OTHER): Payer: Medicare Other | Admitting: Family

## 2020-09-24 ENCOUNTER — Other Ambulatory Visit: Payer: Self-pay

## 2020-09-24 ENCOUNTER — Encounter: Payer: Self-pay | Admitting: Family

## 2020-09-24 VITALS — BP 99/61 | HR 59 | Temp 98.1°F | Resp 18 | Ht 66.0 in | Wt 147.1 lb

## 2020-09-24 DIAGNOSIS — D500 Iron deficiency anemia secondary to blood loss (chronic): Secondary | ICD-10-CM

## 2020-09-24 DIAGNOSIS — D509 Iron deficiency anemia, unspecified: Secondary | ICD-10-CM | POA: Diagnosis present

## 2020-09-24 DIAGNOSIS — D508 Other iron deficiency anemias: Secondary | ICD-10-CM | POA: Diagnosis not present

## 2020-09-24 DIAGNOSIS — K909 Intestinal malabsorption, unspecified: Secondary | ICD-10-CM | POA: Diagnosis not present

## 2020-09-24 DIAGNOSIS — Z9884 Bariatric surgery status: Secondary | ICD-10-CM | POA: Insufficient documentation

## 2020-09-24 LAB — CBC WITH DIFFERENTIAL (CANCER CENTER ONLY)
Abs Immature Granulocytes: 0.01 10*3/uL (ref 0.00–0.07)
Basophils Absolute: 0 10*3/uL (ref 0.0–0.1)
Basophils Relative: 0 %
Eosinophils Absolute: 0.3 10*3/uL (ref 0.0–0.5)
Eosinophils Relative: 6 %
HCT: 33.6 % — ABNORMAL LOW (ref 36.0–46.0)
Hemoglobin: 11.2 g/dL — ABNORMAL LOW (ref 12.0–15.0)
Immature Granulocytes: 0 %
Lymphocytes Relative: 35 %
Lymphs Abs: 1.6 10*3/uL (ref 0.7–4.0)
MCH: 32.3 pg (ref 26.0–34.0)
MCHC: 33.3 g/dL (ref 30.0–36.0)
MCV: 96.8 fL (ref 80.0–100.0)
Monocytes Absolute: 0.6 10*3/uL (ref 0.1–1.0)
Monocytes Relative: 12 %
Neutro Abs: 2.3 10*3/uL (ref 1.7–7.7)
Neutrophils Relative %: 47 %
Platelet Count: 214 10*3/uL (ref 150–400)
RBC: 3.47 MIL/uL — ABNORMAL LOW (ref 3.87–5.11)
RDW: 13.9 % (ref 11.5–15.5)
WBC Count: 4.7 10*3/uL (ref 4.0–10.5)
nRBC: 0 % (ref 0.0–0.2)

## 2020-09-24 LAB — RETICULOCYTES
Immature Retic Fract: 11.2 % (ref 2.3–15.9)
RBC.: 3.43 MIL/uL — ABNORMAL LOW (ref 3.87–5.11)
Retic Count, Absolute: 45.6 10*3/uL (ref 19.0–186.0)
Retic Ct Pct: 1.3 % (ref 0.4–3.1)

## 2020-09-24 LAB — FERRITIN: Ferritin: 232 ng/mL (ref 11–307)

## 2020-09-24 LAB — IRON AND TIBC
Iron: 60 ug/dL (ref 41–142)
Saturation Ratios: 22 % (ref 21–57)
TIBC: 278 ug/dL (ref 236–444)
UIBC: 218 ug/dL (ref 120–384)

## 2020-09-24 NOTE — Addendum Note (Signed)
Addended by: Cooper Render on: 09/24/2020 09:34 AM   Modules accepted: Orders

## 2020-09-24 NOTE — Progress Notes (Signed)
Hematology and Oncology Follow Up Visit  Kimberly Ramos [id number] Jun 02, 1970 50 y.o. 09/24/2020   Principle Diagnosis:  Iron deficiencyanemiasecondary to malabsorption (gastric sleeve 2018) Hyperhomocystinemia  Current Therapy: IV iron as indicated  Folic acid 1 mg PO daily   Interim History:  Kimberly Ramos is here today for follow-up. She is doing well but notes some recent fatigue.  She has not had any issues with blood loss.  She bruises easily on Eliquis and Brilinta but not in excess. No petechiae.  No fever, chills, n/v, cough, rash, dizziness, SOB, chest pain, palpitations, abdominal pain or changes in bowel or bladder habits.  No swelling, tenderness, numbness or tingling in her extremities.  No falls or syncope.  She has maintained a good appetite and is staying well hydrated. Her weight is stable at 147 lbs.   ECOG Performance Status: 1 - Symptomatic but completely ambulatory  Medications:  Allergies as of 09/24/2020      Reactions   Ciprofloxacin Hives, Swelling   Lisinopril Hives, Itching, Swelling   Face swelling      Medication List       Accurate as of Sep 24, 2020  8:40 AM. If you have any questions, ask your nurse or doctor.        albuterol 108 (90 Base) MCG/ACT inhaler Commonly known as: VENTOLIN HFA Inhale into the lungs.   albuterol 1.25 MG/3ML nebulizer solution Commonly known as: ACCUNEB PLEASE SEE ATTACHED FOR DETAILED DIRECTIONS   apixaban 5 MG Tabs tablet Commonly known as: ELIQUIS Take 5 mg by mouth 2 (two) times daily.   Eliquis 5 MG Tabs tablet Generic drug: apixaban Take 1 tablet by mouth 2 (two) times daily.   atorvastatin 80 MG tablet Commonly known as: LIPITOR Take 80 mg by mouth at bedtime.   Budesonide 90 MCG/ACT inhaler Inhale into the lungs.   carvedilol 6.25 MG tablet Commonly known as: COREG Take 6.25 mg by mouth 2 (two) times daily.   Clever Choice Tens Unit Devi TENS Unit (Flex IT or Flex MT Plus) and  Supplies (Dx: Lumbar radiculopathy [M54.16])  .   diclofenac sodium 1 % Gel Commonly known as: VOLTAREN Apply topically.   digoxin 0.125 MG tablet Commonly known as: LANOXIN Take 125 mcg by mouth every other day.   digoxin 0.125 MG tablet Commonly known as: LANOXIN Take by mouth.   EPINEPHrine 0.3 mg/0.3 mL Soaj injection Commonly known as: EPI-PEN Use as directed for severe allergic reaction.   esomeprazole 40 MG capsule Commonly known as: NEXIUM TAKE 1 CAPSULE TWICE DAILY 30 MINUTES PRIOR TO BREAKFAST AND DINNER.   ferrous sulfate 325 (65 FE) MG EC tablet Take 325 mg by mouth daily.   fexofenadine 180 MG tablet Commonly known as: ALLEGRA Take by mouth.   fluticasone 44 MCG/ACT inhaler Commonly known as: FLOVENT HFA INHALE 2 PUFFS EVERY DAY   folic acid 1 MG tablet Commonly known as: FOLVITE Take 2 tablets (2 mg total) by mouth daily.   folic acid 400 MCG tablet Commonly known as: FOLVITE Take by mouth.   furosemide 40 MG tablet Commonly known as: LASIX Take 40 mg by mouth as needed.   gabapentin 300 MG capsule Commonly known as: NEURONTIN Take 600 mg by mouth at bedtime.   glipiZIDE 5 MG 24 hr tablet Commonly known as: GLUCOTROL XL Take by mouth.   hydrALAZINE 10 MG tablet Commonly known as: APRESOLINE Take by mouth.   losartan 50 MG tablet Commonly known as: COZAAR  losartan 100 MG tablet Commonly known as: COZAAR Take 100 mg by mouth daily.   magnesium oxide 400 MG tablet Commonly known as: MAG-OX Take by mouth.   Multi-Vitamin tablet Take 1 tablet by mouth daily.   nitroGLYCERIN 0.4 MG SL tablet Commonly known as: NITROSTAT Place under the tongue.   olopatadine 0.1 % ophthalmic solution Commonly known as: PATANOL 1 drop 2 times daily.   OneTouch Delica Lancets 33G Misc SMARTSIG:1 Strip(s) Topical Daily   OneTouch Verio test strip Generic drug: glucose blood daily. for testing   sertraline 25 MG tablet Commonly known as:  ZOLOFT Take by mouth.   sertraline 100 MG tablet Commonly known as: ZOLOFT Take 100 mg by mouth daily.   spironolactone 25 MG tablet Commonly known as: ALDACTONE Take 12.5 mg by mouth daily.   Synjardy XR 12.08-998 MG Tb24 Generic drug: Empagliflozin-metFORMIN HCl ER Take 1 tablet by mouth 2 (two) times daily.   ticagrelor 90 MG Tabs tablet Commonly known as: BRILINTA TAKE 1 TABLET (90 MG TOTAL) BY MOUTH 2 (TWO) TIMES DAILY.   tiZANidine 2 MG tablet Commonly known as: ZANAFLEX Take by mouth once.   tiZANidine 4 MG tablet Commonly known as: ZANAFLEX Take 2-4 mg by mouth 2 (two) times daily as needed.       Allergies:  Allergies  Allergen Reactions  . Ciprofloxacin Hives and Swelling  . Lisinopril Hives, Itching and Swelling    Face swelling     Past Medical History, Surgical history, Social history, and Family History were reviewed and updated.  Review of Systems: All other 10 point review of systems is negative.   Physical Exam:  height is 5\' 6"  (1.676 m) and weight is 147 lb 1.9 oz (66.7 kg). Her oral temperature is 98.1 F (36.7 C). Her blood pressure is 99/61 and her pulse is 59 (abnormal). Her respiration is 18 and oxygen saturation is 99%.   Wt Readings from Last 3 Encounters:  09/24/20 147 lb 1.9 oz (66.7 kg)  03/26/20 151 lb 12.8 oz (68.9 kg)  09/22/19 148 lb 1.3 oz (67.2 kg)    Ocular: Sclerae unicteric, pupils equal, round and reactive to light Ear-nose-throat: Oropharynx clear, dentition fair Lymphatic: No cervical or supraclavicular adenopathy Lungs no rales or rhonchi, good excursion bilaterally Heart regular rate and rhythm, no murmur appreciated Abd soft, nontender, positive bowel sounds MSK no focal spinal tenderness, no joint edema Neuro: non-focal, well-oriented, appropriate affect Breasts: Deferred   Lab Results  Component Value Date   WBC 4.7 09/24/2020   HGB 11.2 (L) 09/24/2020   HCT 33.6 (L) 09/24/2020   MCV 96.8 09/24/2020    PLT 214 09/24/2020   Lab Results  Component Value Date   FERRITIN 133 03/26/2020   IRON 57 03/26/2020   TIBC 296 03/26/2020   UIBC 240 03/26/2020   IRONPCTSAT 19 (L) 03/26/2020   Lab Results  Component Value Date   RETICCTPCT 1.3 09/24/2020   RBC 3.47 (L) 09/24/2020   No results found for: KPAFRELGTCHN, LAMBDASER, KAPLAMBRATIO No results found for: IGGSERUM, IGA, IGMSERUM No results found for: 09/26/2020, A1GS, A2GS, Dorene Ar, MSPIKE, SPEI   Chemistry      Component Value Date/Time   NA 139 03/14/2019 1342   NA 146 (H) 01/18/2017 1310   K 5.6 (H) 03/14/2019 1342   K 4.8 (H) 01/18/2017 1310   CL 106 03/14/2019 1342   CL 107 01/18/2017 1310   CO2 27 03/14/2019 1342   CO2 27 01/18/2017 1310  BUN 21 (H) 03/14/2019 1342   BUN 21 01/18/2017 1310   CREATININE 1.41 (H) 03/14/2019 1342   CREATININE 1.4 (H) 01/18/2017 1310      Component Value Date/Time   CALCIUM 9.4 03/14/2019 1342   CALCIUM 9.2 01/18/2017 1310   ALKPHOS 139 (H) 03/13/2019 0855   ALKPHOS 100 (H) 01/18/2017 1310   AST 29 03/13/2019 0855   ALT 28 03/13/2019 0855   ALT 19 01/18/2017 1310   BILITOT 0.6 03/13/2019 0855       Impression and Plan: Ms. Common is a very pleasant 50yo African American female with iron deficiency anemia secondary to malabsorption after gastric bypass in 2018. Iron studies are pending. We will replace if needed.  Follow-up in 5 months.  She will contact our office with any questions or concerns.   Emeline Gins, NP 5/31/20228:40 AM

## 2020-09-25 ENCOUNTER — Telehealth: Payer: Self-pay | Admitting: *Deleted

## 2020-09-25 NOTE — Telephone Encounter (Signed)
09/24/20 los - called and gave patient upcoming appointments - view mychart

## 2021-01-04 ENCOUNTER — Encounter (HOSPITAL_BASED_OUTPATIENT_CLINIC_OR_DEPARTMENT_OTHER): Payer: Self-pay | Admitting: *Deleted

## 2021-01-04 ENCOUNTER — Emergency Department (HOSPITAL_BASED_OUTPATIENT_CLINIC_OR_DEPARTMENT_OTHER)
Admission: EM | Admit: 2021-01-04 | Discharge: 2021-01-04 | Disposition: A | Discharge status: Home / Self Care | Payer: Medicare Other | Attending: Emergency Medicine | Admitting: Emergency Medicine

## 2021-01-04 ENCOUNTER — Emergency Department (HOSPITAL_BASED_OUTPATIENT_CLINIC_OR_DEPARTMENT_OTHER)
Admission: EM | Admit: 2021-01-04 | Discharge: 2021-01-04 | Disposition: A | Discharge status: Home / Self Care | Payer: Medicare Other | Source: Home / Self Care | Attending: Emergency Medicine | Admitting: Emergency Medicine

## 2021-01-04 ENCOUNTER — Other Ambulatory Visit: Payer: Self-pay

## 2021-01-04 DIAGNOSIS — I10 Essential (primary) hypertension: Secondary | ICD-10-CM | POA: Insufficient documentation

## 2021-01-04 DIAGNOSIS — Z87891 Personal history of nicotine dependence: Secondary | ICD-10-CM | POA: Diagnosis not present

## 2021-01-04 DIAGNOSIS — U071 COVID-19: Secondary | ICD-10-CM | POA: Insufficient documentation

## 2021-01-04 DIAGNOSIS — I2510 Atherosclerotic heart disease of native coronary artery without angina pectoris: Secondary | ICD-10-CM | POA: Diagnosis not present

## 2021-01-04 DIAGNOSIS — J45909 Unspecified asthma, uncomplicated: Secondary | ICD-10-CM | POA: Insufficient documentation

## 2021-01-04 DIAGNOSIS — R197 Diarrhea, unspecified: Secondary | ICD-10-CM

## 2021-01-04 DIAGNOSIS — Z7901 Long term (current) use of anticoagulants: Secondary | ICD-10-CM | POA: Diagnosis not present

## 2021-01-04 DIAGNOSIS — R509 Fever, unspecified: Secondary | ICD-10-CM

## 2021-01-04 LAB — RESP PANEL BY RT-PCR (FLU A&B, COVID) ARPGX2
Influenza A by PCR: NEGATIVE
Influenza B by PCR: NEGATIVE
SARS Coronavirus 2 by RT PCR: POSITIVE — AB

## 2021-01-04 MED ORDER — ALBUTEROL SULFATE HFA 108 (90 BASE) MCG/ACT IN AERS: 2.0000 | INHALATION_SPRAY | Freq: Once | RESPIRATORY_TRACT | Status: DC | PRN

## 2021-01-04 MED ORDER — EPINEPHRINE 0.3 MG/0.3ML IJ SOAJ: 0.3000 mg | Freq: Once | INTRAMUSCULAR | Status: DC | PRN

## 2021-01-04 MED ORDER — METHYLPREDNISOLONE SODIUM SUCC 125 MG IJ SOLR
125.0000 mg | Freq: Once | INTRAMUSCULAR | Status: DC | PRN
Start: 2021-01-04 — End: 2021-01-05

## 2021-01-04 MED ORDER — FAMOTIDINE IN NACL 20-0.9 MG/50ML-% IV SOLN
20.0000 mg | Freq: Once | INTRAVENOUS | Status: DC | PRN
Start: 2021-01-04 — End: 2021-01-05

## 2021-01-04 MED ORDER — BEBTELOVIMAB 175 MG/2 ML IV (EUA)
175.0000 mg | Freq: Once | INTRAMUSCULAR | Status: AC
  Administered 2021-01-04: 175 mg via INTRAVENOUS
  Filled 2021-01-04: qty 2

## 2021-01-04 MED ORDER — SODIUM CHLORIDE 0.9 % IV SOLN: INTRAVENOUS | Status: DC | PRN

## 2021-01-04 MED ORDER — DIPHENHYDRAMINE HCL 50 MG/ML IJ SOLN: 50.0000 mg | Freq: Once | INTRAMUSCULAR | Status: DC | PRN

## 2021-01-04 NOTE — ED Provider Notes (Signed)
[facility] Provider Note   CSN: [id number] Arrival date & time: 01/04/21  1549     History Chief Complaint  Patient presents with   Fever    Kimberly Ramos is a 49 y.o. female.  Patient with history of ischemic cardiomyopathy, heart failure --presents to the emergency department for fever.  Symptoms started yesterday.  She reports having chills and then developed diarrhea and fever, as high as 102 F at home.  No associated abdominal pain or blood in the stool.  She denies URI symptoms such as ear pain, runny nose, congestion, sore throat, or cough.  No known sick contacts.  She did participate in a choir practice a couple days prior to symptom onset.  She feels that from a heart failure standpoint she is doing very well.  No lower extremity swelling reported.  She is fully vaccinated against COVID +1 booster.  She has been taking Tylenol at home for fever.  The onset of this condition was acute. The course is constant. Aggravating factors: none. Alleviating factors: none.        Past Medical History:  Diagnosis Date   Arthritis    Asthma    CAD S/P percutaneous coronary angioplasty 06/2012   2 vessel disease old sub totaled occ. of RCA and subtotal occ. of LAD   Diabetes mellitus without complication (HCC)    GERD (gastroesophageal reflux disease)    H/O hiatal hernia    Hypertension    Morbid obesity with BMI of 45.0-49.9, adult ([place])    NSTEMI (non-ST elevated myocardial infarction) ([place]) 06/2012; Jan 2017   OSA on CPAP    Presence of drug coated stent in LAD coronary artery 06/2102; Jan 2017   a. for chronic subtotal LAD occlusion placed 06/2012; b. NSTEMI - PCI LAD-D1 bifurcation [facility])   Stroke [facility])    Tobacco use disorder 07/07/2012    Patient Active Problem List   Diagnosis Date Noted   IDA (iron deficiency anemia) 01/19/2017   Allergic conjunctivitis 11/12/2016   History of food allergy 11/12/2016   Epistaxis 05/27/2016    Acute sinusitis 01/23/2016   Mild persistent asthma 01/23/2016   Allergic rhinitis 01/23/2016   DOE (dyspnea on exertion) 02/17/2015   Pulmonary HTN ([place]) 02/17/2015   Edema extremities 02/17/2015   Severe obesity (BMI >= 40) ([place]) 04/06/2013   Hyperlipidemia LDL goal <70 04/06/2013   Callus of foot 09/21/2012   Tinea pedis 09/21/2012   Presence of drug coated stent in LAD coronary artery - From Prox LAD, across D1 into previously Chronically "subtotal" occluded mid LAD 07/21/2012   CAD (coronary artery disease), LAD and 100% occluded RCA  -- PCI to Subtotal LAD CTO 07/04/12 DES stetn. 07/04/2012   Diabetes mellitus type 2, in the obese -- insulin dependent,  07/04/2012   Essential hypertension 07/04/2012   History of: NSTEMI (non-ST elevated myocardial infarction), 07/07/12 07/04/2012    Past Surgical History:  Procedure Laterality Date   CARDIAC CATHETERIZATION  07/05/2012   07/05/12  + CAD   CARDIAC CATHETERIZATION  2010   RCA stenosis with collaterals   CARDIAC CATHETERIZATION  Apr 30, 2015   [facility] - Severe LAD stenosis @ prox edge of stent --> PCI   carpel tunnel     LEFT HEART CATHETERIZATION WITH CORONARY ANGIOGRAM N/A 07/05/2012   Procedure: LEFT HEART CATHETERIZATION WITH CORONARY ANGIOGRAM;  Surgeon: Troy Sine, MD;  Location: [facility] CATH LAB;  Service: Cardiovascular;  LAD Subtototalled after  D1 (not noticed in 2010, but present), RCA CTO.   MYOCARDIAL PERFUSION IMAGING  07/15/2012   EF 57% lge area ant and septal ischemia   PERCUTANEOUS CORONARY STENT INTERVENTION (PCI-S) N/A 07/20/2012   Procedure: PERCUTANEOUS CORONARY STENT INTERVENTION (PCI-S);  Surgeon: Leonie Man, MD;  Location: [facility] CATH LAB;  PCI of LAD Promus Premier 2.25 mm x 20 mm ( 2.65 mm prox)   PERCUTANEOUS CORONARY STENT INTERVENTION (PCI-S)  May 01, 2015   [facility] - PCI of LAD (prox of prior stent), & ? D1. (DES)      OB History   No obstetric history on file.     Family History  Problem Relation Age  of Onset   Diabetes Mother    Diabetes Father    Heart attack Maternal Grandmother    Diabetes Maternal Grandmother     Social History   Tobacco Use   Smoking status: Former    Types: Cigarettes    Quit date: 07/04/2012    Years since quitting: 8.5   Smokeless tobacco: Never  Vaping Use   Vaping Use: Former   Substances: CBD  Substance Use Topics   Alcohol use: Yes    Comment: occasional   Drug use: Yes    Types: Marijuana    Home Medications Prior to Admission medications   Medication Sig Start Date End Date Taking? Authorizing Provider  albuterol (ACCUNEB) 1.25 MG/3ML nebulizer solution PLEASE SEE ATTACHED FOR DETAILED DIRECTIONS 02/24/18   [provider]  albuterol (PROVENTIL HFA;VENTOLIN HFA) 108 (90 Base) MCG/ACT inhaler Inhale into the lungs.    [provider]  apixaban (ELIQUIS) 5 MG TABS tablet Take 5 mg by mouth 2 (two) times daily. 02/10/18   [provider]  apixaban (ELIQUIS) 5 MG TABS tablet Take 1 tablet by mouth 2 (two) times daily. 11/23/19   [provider]  atorvastatin (LIPITOR) 80 MG tablet Take 80 mg by mouth at bedtime. 08/30/16   [provider]  Blood Glucose Monitoring Suppl (FIFTY50 GLUCOSE METER 2.0) w/Device KIT See admin instructions. 06/18/20   [provider]  Budesonide 90 MCG/ACT inhaler Inhale into the lungs. 06/22/16   [provider]  carvedilol (COREG) 6.25 MG tablet Take 6.25 mg by mouth 2 (two) times daily. 03/14/18   [provider]  diclofenac sodium (VOLTAREN) 1 % GEL Apply topically. 01/01/15   [provider]  digoxin (LANOXIN) 0.125 MG tablet Take 125 mcg by mouth every other day. 07/01/19   [provider]  EPINEPHrine 0.3 mg/0.3 mL IJ SOAJ injection Use as directed for severe allergic reaction. 10/14/16   Valentina Shaggy, MD  esomeprazole (NEXIUM) 40 MG capsule TAKE 1 CAPSULE TWICE DAILY 30 MINUTES PRIOR TO BREAKFAST AND DINNER. 10/28/14    [provider]  ferrous sulfate 325 (65 FE) MG EC tablet Take 325 mg by mouth daily. 02/10/18   [provider]  fexofenadine (ALLEGRA) 180 MG tablet Take by mouth.    [provider]  fluticasone (FLONASE) 50 MCG/ACT nasal spray Place into the nose.    [provider]  fluticasone (FLOVENT HFA) 44 MCG/ACT inhaler INHALE 2 PUFFS EVERY DAY 11/14/14   [provider]  folic acid (FOLVITE) 1 MG tablet Take 2 tablets (2 mg total) by mouth daily. 06/29/19   Celso Amy, NP  folic acid (FOLVITE) 545 MCG tablet Take by mouth. 10/04/19   [provider]  furosemide (LASIX) 40 MG tablet Take 40 mg by mouth as  needed. 08/18/16   [provider]  gabapentin (NEURONTIN) 300 MG capsule Take 600 mg by mouth at bedtime. 05/13/17   [provider]  glipiZIDE (GLUCOTROL XL) 5 MG 24 hr tablet Take by mouth. 10/06/19   [provider]  hydrALAZINE (APRESOLINE) 10 MG tablet Take by mouth. 01/02/20   [provider]  isosorbide mononitrate (IMDUR) 60 MG 24 hr tablet Take 1 tablet by mouth daily. 09/23/16   [provider]  levocetirizine (XYZAL) 5 MG tablet Take by mouth.    [provider]  losartan (COZAAR) 100 MG tablet Take 100 mg by mouth daily. 01/04/20   [provider]  losartan (COZAAR) 50 MG tablet  03/23/18   [provider]  magnesium oxide (MAG-OX) 400 MG tablet Take by mouth. 03/14/18   [provider]  Multiple Vitamin (MULTI-VITAMIN) tablet Take 1 tablet by mouth daily.    [provider]  Nerve Stimulator (CLEVER CHOICE TENS UNIT) DEVI TENS Unit (Flex IT or Flex MT Plus) and Supplies (Dx: Lumbar radiculopathy [M54.16])  . 05/14/17   [provider]  nitroGLYCERIN (NITROSTAT) 0.4 MG SL tablet Place under the tongue. 08/20/14   [provider]  olopatadine (PATANOL) 0.1 % ophthalmic solution 1 drop 2 times daily.    [provider]  OneTouch  Delica Lancets 03F MISC SMARTSIG:1 Strip(s) Topical Daily 07/21/19   [provider]  [facility] VERIO test strip daily. for testing 07/18/19   [provider]  sertraline (ZOLOFT) 100 MG tablet Take 100 mg by mouth daily. 08/20/19   [provider]  sertraline (ZOLOFT) 25 MG tablet Take by mouth.    [provider]  spironolactone (ALDACTONE) 25 MG tablet Take 12.5 mg by mouth daily. 12/05/18   [provider]  SYNJARDY XR 12.08-998 MG TB24 Take 1 tablet by mouth 2 (two) times daily. 09/06/19   [provider]  ticagrelor (BRILINTA) 90 MG TABS tablet TAKE 1 TABLET (90 MG TOTAL) BY MOUTH 2 (TWO) TIMES DAILY. 09/09/15   [provider]  tiZANidine (ZANAFLEX) 2 MG tablet Take by mouth once.  Patient not taking: Reported on 09/24/2020    [provider]  tiZANidine (ZANAFLEX) 4 MG tablet Take 2-4 mg by mouth 2 (two) times daily as needed. 01/30/20   [provider]    Allergies    Ciprofloxacin, Lisinopril, and Metformin and related  Review of Systems   Review of Systems  Constitutional:  Positive for chills and fever. Negative for fatigue.  HENT:  Negative for congestion, ear pain, rhinorrhea, sinus pressure and sore throat.   Eyes:  Negative for redness.  Respiratory:  Negative for cough and wheezing.   Gastrointestinal:  Positive for diarrhea. Negative for abdominal pain, nausea and vomiting.  Genitourinary:  Negative for dysuria.  Musculoskeletal:  Negative for myalgias and neck stiffness.  Skin:  Negative for rash.  Neurological:  Negative for headaches.  Hematological:  Negative for adenopathy.   Physical Exam Updated Vital Signs BP 127/88 (BP Location: Left Arm)   Pulse (!) 58   Temp 99.5 F (37.5 C) (Oral)   Resp 16   Ht $R'5\' 5"'cE$  (1.651 m)   Wt 66.7 kg   SpO2 100%   BMI 24.46 kg/m   Physical Exam Vitals and nursing note reviewed.  Constitutional:      General: She is not in acute distress.     Appearance: She is well-developed.  HENT:     Head: Normocephalic and atraumatic.  Jaw: No trismus.     Right Ear: Tympanic membrane, ear canal and external ear normal.     Left Ear: Tympanic membrane, ear canal and external ear normal.     Nose: Nose normal. No mucosal edema or rhinorrhea.     Mouth/Throat:     Mouth: Mucous membranes are moist. Mucous membranes are not dry. No oral lesions.     Pharynx: Uvula midline. No oropharyngeal exudate, posterior oropharyngeal erythema or uvula swelling.     Tonsils: No tonsillar abscesses.  Eyes:     General:        Right eye: No discharge.        Left eye: No discharge.     Conjunctiva/sclera: Conjunctivae normal.  Cardiovascular:     Rate and Rhythm: Normal rate and regular rhythm.     Heart sounds: Normal heart sounds. No murmur heard. Pulmonary:     Effort: Pulmonary effort is normal. No respiratory distress.     Breath sounds: Normal breath sounds. No wheezing, rhonchi or rales.  Abdominal:     Palpations: Abdomen is soft.     Tenderness: There is no abdominal tenderness. There is no guarding or rebound.  Musculoskeletal:     Cervical back: Normal range of motion and neck supple.     Right lower leg: No edema.     Left lower leg: No edema.  Lymphadenopathy:     Cervical: No cervical adenopathy.  Skin:    General: Skin is warm and dry.     Findings: No rash.  Neurological:     General: No focal deficit present.     Mental Status: She is alert. Mental status is at baseline.     Motor: No weakness.  Psychiatric:        Mood and Affect: Mood normal.    ED Results / Procedures / Treatments   Labs (all labs ordered are listed, but only abnormal results are displayed) Labs Reviewed  RESP PANEL BY RT-PCR (FLU A&B, COVID) [id number]    EKG None  Radiology No results found.  Procedures Procedures   Medications Ordered in ED Medications - No data to display  ED Course  I have reviewed the triage vital signs and the  nursing notes.  Pertinent labs & imaging results that were available during my care of the patient were reviewed by me and considered in my medical decision making (see chart for details).  Patient seen and examined.  Patient looks very comfortable and well at this point.  She is concerned about COVID and I think it is reasonable to send a COVID test.  Otherwise, at this point we will treat conservatively.  We did discuss antiviral medication for COVID if she were to test positive.  She would like to be prescribed this if indicated.  I will follow up on result.  Vital signs reviewed and are as follows: BP 127/88 (BP Location: Left Arm)   Pulse (!) 58   Temp 99.5 F (37.5 C) (Oral)   Resp 16   Ht $R'5\' 5"'CS$  (1.651 m)   Wt 66.7 kg   SpO2 100%   BMI 24.46 kg/m   We discussed signs symptoms to return which include worsening shortness of breath, trouble breathing, or increased work of breathing.  Also return with persistent vomiting, confusion, passing out, or if they have any other concerns. Counseled on the need for rest and good hydration. Discussed that high-risk contacts should be aware of positive result and they need to  quarantine and be tested if they develop any symptoms. Patient verbalizes understanding.   Maddalyn D Hayes was evaluated in Emergency Department on 01/04/2021 for the symptoms described in the history of present illness. She was evaluated in the context of the global COVID-19 pandemic, which necessitated consideration that the patient might be at risk for infection with the SARS-CoV-2 virus that causes COVID-19. Institutional protocols and algorithms that pertain to the evaluation of patients at risk for COVID-19 are in a state of rapid change based on information released by regulatory bodies including the CDC and federal and state organizations. These policies and algorithms were followed during the patient's care in the ED.     MDM Rules/Calculators/A&P                            Well-appearing patient with fever and some diarrhea recently.  COVID testing pending.  She appears well.  Abdomen is soft and nontender.  No URI or respiratory symptoms.  Do not feel that she requires extensive work-up at this time, but may need further evaluation if symptoms change or worsen.  She seems reliable to return if symptoms change or worsen.    Final Clinical Impression(s) / ED Diagnoses Final diagnoses:  Febrile illness  Diarrhea, unspecified type    Rx / DC Orders ED Discharge Orders     None        Carlisle Cater, PA-C 01/04/21 1635    Gareth Morgan, MD 01/06/21 1301

## 2021-01-04 NOTE — ED Triage Notes (Signed)
Seen here earlier today- Here now for bebtelovimab infusion

## 2021-01-04 NOTE — ED Provider Notes (Signed)
Patient seen by me previously today.  She tested positive for COVID.  Given multiple cardiac medications including antiarrhythmics and anticoagulants, not felt to be a good candidate for pack Slo-Bid.  I spoke with pharmacy who recommended that she could return for bebtelovimab infusion.  Patient agreed to do this and she returns now for the infusion.  No changes.  BP 118/79 (BP Location: Right Arm)   Pulse (!) 59   Temp (!) 100.8 F (38.2 C) (Oral)   Resp 16   Ht 5\' 5"  (1.651 m)   Wt 66.7 kg   SpO2 99%   BMI 24.46 kg/m   8:03 PM patient seen. Unchanged from earlier. PA-C aware of patient and plan to d/c after treatment.    Keenan Bachelor, PA-C 01/04/21 03/06/21, MD 01/06/21 1302

## 2021-01-04 NOTE — Discharge Instructions (Signed)
Please read and follow all provided instructions.  Your diagnoses today include:  1. Febrile illness   2. Diarrhea, unspecified type     Tests performed today include: Vital signs. See below for your results today.  COVID test - pending, check mychart for results  Medications prescribed:  None  Take any prescribed medications only as directed. Treatment for your infection is aimed at treating the symptoms. There are no medications, such as antibiotics, that will cure your infection.   Home care instructions:  Follow any educational materials contained in this packet.   Your illness is contagious and can be spread to others, especially during the first 3 or 4 days. It cannot be cured by antibiotics or other medicines. Take basic precautions such as washing your hands often, covering your mouth when you cough or sneeze, and avoiding public places where you could spread your illness to others.   Please continue drinking plenty of fluids.  Use over-the-counter medicines as needed as directed on packaging for symptom relief.  You may also use ibuprofen or tylenol as directed on packaging for pain or fever.  Do not take multiple medicines containing Tylenol or acetaminophen to avoid taking too much of this medication.  If you are positive for Covid-19, you should isolate yourself and not be exposed to other people for 5 days after your symptoms began. If you are not feeling better at day 5, you need to isolate yourself for a total of 10 days. If you are feeling better by day 5, you should wear a mask properly, over your nose and mouth, at all times while around other people until 10 days after your symptoms started.   Follow-up instructions: Please follow-up with your primary care provider as needed for further evaluation of your symptoms if you are not feeling better.   Return instructions:  Please return to the Emergency Department if you experience worsening symptoms.  Return to the  emergency department if you have worsening shortness of breath breathing or increased work of breathing, persistent vomiting RETURN IMMEDIATELY IF you develop shortness of breath, confusion or altered mental status, a new rash, become dizzy, faint, or poorly responsive, or are unable to be cared for at home. Please return if you have persistent vomiting and cannot keep down fluids or develop a fever that is not controlled by tylenol or motrin.   Please return if you have any other emergent concerns.  Additional Information:  Your vital signs today were: BP 127/88 (BP Location: Left Arm)   Pulse (!) 58   Temp 99.5 F (37.5 C) (Oral)   Resp 16   Ht 5\' 5"  (1.651 m)   Wt 66.7 kg   SpO2 100%   BMI 24.46 kg/m  If your blood pressure (BP) was elevated above 135/85 this visit, please have this repeated by your doctor within one month. --------------

## 2021-01-04 NOTE — ED Triage Notes (Signed)
Chills and diarrhea since yesterday. Fever 102 last night. Taking OTC meds

## 2021-01-04 NOTE — Discharge Instructions (Addendum)
Please read and follow all provided instructions.  Your diagnoses today include:  1. COVID-19     Tests performed today include: Vital signs. See below for your results today.  COVID test - was positive  Medications prescribed:  None  Take any prescribed medications only as directed. Treatment for your infection is aimed at treating the symptoms. There are no medications, such as antibiotics, that will cure your infection.   Home care instructions:  Follow any educational materials contained in this packet.   Your illness is contagious and can be spread to others, especially during the first 3 or 4 days. It cannot be cured by antibiotics or other medicines. Take basic precautions such as washing your hands often, covering your mouth when you cough or sneeze, and avoiding public places where you could spread your illness to others.   Please continue drinking plenty of fluids.  Use over-the-counter medicines as needed as directed on packaging for symptom relief.  You may also use ibuprofen or tylenol as directed on packaging for pain or fever.  Do not take multiple medicines containing Tylenol or acetaminophen to avoid taking too much of this medication.  If you are positive for Covid-19, you should isolate yourself and not be exposed to other people for 5 days after your symptoms began. If you are not feeling better at day 5, you need to isolate yourself for a total of 10 days. If you are feeling better by day 5, you should wear a mask properly, over your nose and mouth, at all times while around other people until 10 days after your symptoms started.   Follow-up instructions: Please follow-up with your primary care provider as needed for further evaluation of your symptoms if you are not feeling better.   Return instructions:  Please return to the Emergency Department if you experience worsening symptoms.  Return to the emergency department if you have worsening shortness of breath  breathing or increased work of breathing, persistent vomiting RETURN IMMEDIATELY IF you develop shortness of breath, confusion or altered mental status, a new rash, become dizzy, faint, or poorly responsive, or are unable to be cared for at home. Please return if you have persistent vomiting and cannot keep down fluids or develop a fever that is not controlled by tylenol or motrin.   Please return if you have any other emergent concerns.  Additional Information:  Your vital signs today were: BP 118/79 (BP Location: Right Arm)   Pulse (!) 59   Temp (!) 100.8 F (38.2 C) (Oral)   Resp 16   Ht 5\' 5"  (1.651 m)   Wt 66.7 kg   SpO2 99%   BMI 24.46 kg/m  If your blood pressure (BP) was elevated above 135/85 this visit, please have this repeated by your doctor within one month. --------------

## 2021-03-26 ENCOUNTER — Encounter: Payer: Self-pay | Admitting: Family

## 2021-03-26 ENCOUNTER — Inpatient Hospital Stay (HOSPITAL_BASED_OUTPATIENT_CLINIC_OR_DEPARTMENT_OTHER): Payer: Medicare Other | Admitting: Family

## 2021-03-26 ENCOUNTER — Other Ambulatory Visit: Payer: Self-pay

## 2021-03-26 ENCOUNTER — Inpatient Hospital Stay: Payer: Medicare Other | Attending: Hematology & Oncology

## 2021-03-26 ENCOUNTER — Telehealth: Payer: Self-pay | Admitting: *Deleted

## 2021-03-26 VITALS — BP 137/77 | HR 68 | Temp 98.7°F | Resp 17 | Ht 65.0 in | Wt 150.0 lb

## 2021-03-26 DIAGNOSIS — D509 Iron deficiency anemia, unspecified: Secondary | ICD-10-CM | POA: Insufficient documentation

## 2021-03-26 DIAGNOSIS — D508 Other iron deficiency anemias: Secondary | ICD-10-CM

## 2021-03-26 DIAGNOSIS — K909 Intestinal malabsorption, unspecified: Secondary | ICD-10-CM | POA: Insufficient documentation

## 2021-03-26 DIAGNOSIS — I214 Non-ST elevation (NSTEMI) myocardial infarction: Secondary | ICD-10-CM | POA: Diagnosis not present

## 2021-03-26 DIAGNOSIS — D500 Iron deficiency anemia secondary to blood loss (chronic): Secondary | ICD-10-CM | POA: Diagnosis not present

## 2021-03-26 DIAGNOSIS — Z9884 Bariatric surgery status: Secondary | ICD-10-CM | POA: Insufficient documentation

## 2021-03-26 LAB — CBC WITH DIFFERENTIAL (CANCER CENTER ONLY)
Abs Immature Granulocytes: 0.01 10*3/uL (ref 0.00–0.07)
Basophils Absolute: 0 10*3/uL (ref 0.0–0.1)
Basophils Relative: 1 %
Eosinophils Absolute: 0.2 10*3/uL (ref 0.0–0.5)
Eosinophils Relative: 5 %
HCT: 33.6 % — ABNORMAL LOW (ref 36.0–46.0)
Hemoglobin: 11.5 g/dL — ABNORMAL LOW (ref 12.0–15.0)
Immature Granulocytes: 0 %
Lymphocytes Relative: 30 %
Lymphs Abs: 1.3 10*3/uL (ref 0.7–4.0)
MCH: 33 pg (ref 26.0–34.0)
MCHC: 34.2 g/dL (ref 30.0–36.0)
MCV: 96.3 fL (ref 80.0–100.0)
Monocytes Absolute: 0.5 10*3/uL (ref 0.1–1.0)
Monocytes Relative: 12 %
Neutro Abs: 2.3 10*3/uL (ref 1.7–7.7)
Neutrophils Relative %: 52 %
Platelet Count: 217 10*3/uL (ref 150–400)
RBC: 3.49 MIL/uL — ABNORMAL LOW (ref 3.87–5.11)
RDW: 14.6 % (ref 11.5–15.5)
WBC Count: 4.4 10*3/uL (ref 4.0–10.5)
nRBC: 0 % (ref 0.0–0.2)

## 2021-03-26 LAB — IRON AND TIBC
Iron: 114 ug/dL (ref 28–170)
Saturation Ratios: 34 % — ABNORMAL HIGH (ref 10.4–31.8)
TIBC: 339 ug/dL (ref 250–450)
UIBC: 225 ug/dL

## 2021-03-26 LAB — RETICULOCYTES
Immature Retic Fract: 13.5 % (ref 2.3–15.9)
RBC.: 3.5 MIL/uL — ABNORMAL LOW (ref 3.87–5.11)
Retic Count, Absolute: 52.9 K/uL (ref 19.0–186.0)
Retic Ct Pct: 1.5 % (ref 0.4–3.1)

## 2021-03-26 LAB — FERRITIN: Ferritin: 125 ng/mL (ref 11–307)

## 2021-03-26 MED ORDER — FOLIC ACID 1 MG PO TABS
2.0000 mg | ORAL_TABLET | Freq: Every day | ORAL | 6 refills | Status: DC
Start: 2021-03-26 — End: 2022-12-07

## 2021-03-26 NOTE — Telephone Encounter (Signed)
Per 03/26/21 los - gave upcoming appointments - confirmed 

## 2021-03-26 NOTE — Progress Notes (Signed)
Hematology and Oncology Follow Up Visit  Kimberly Ramos [id number] 03-26-1971 50 y.o. 03/26/2021   Principle Diagnosis:  Iron deficiency anemia secondary to malabsorption (gastric sleeve 2018) Hyperhomocystinemia    Current Therapy:        IV iron as indicated  Folic acid 2 mg PO daily   Interim History:  Kimberly Ramos is here today for follow-up. She is doing well and has no complaints at this time.  She has occasional fatigue.  No blood loss noted. She has an IUD in place and does not have a cycle.  No abnormal bruising or petechiae.  No fever, chills, n/v, cough, rash, dizziness, chest pain, palpitations, abdominal pain or changes in bowel or bladder habits.  She states that she has occasional SOB with heart failure exacerbation.  No swelling, tenderness, numbness or tingling in her extremities.  No falls or syncope.  She has maintained a good appetite and is staying well hydrated. Her weight is stable at 150 lbs.   ECOG Performance Status: 0 - Asymptomatic  Medications:  Allergies as of 03/26/2021       Reactions   Ciprofloxacin Hives, Swelling   Lisinopril Hives, Itching, Swelling   Face swelling   Metformin And Related Diarrhea, Nausea And Vomiting        Medication List        Accurate as of March 26, 2021  9:04 AM. If you have any questions, ask your nurse or doctor.          STOP taking these medications    Budesonide 90 MCG/ACT inhaler Stopped by: Lottie Dawson, NP   Clever Choice Tens Unit [place] Stopped by: Lottie Dawson, NP   diclofenac sodium 1 % Gel Commonly known as: VOLTAREN Stopped by: Lottie Dawson, NP   fexofenadine 180 MG tablet Commonly known as: ALLEGRA Stopped by: Lottie Dawson, NP   isosorbide mononitrate 60 MG 24 hr tablet Commonly known as: IMDUR Stopped by: Lottie Dawson, NP   OneTouch Delica Lancets 37C Misc Stopped by: Lottie Dawson, NP   OneTouch Verio test strip Generic drug: glucose blood Stopped by: Lottie Dawson, NP        TAKE these medications    albuterol 108 (90 Base) MCG/ACT inhaler Commonly known as: VENTOLIN HFA Inhale into the lungs.   albuterol 1.25 MG/3ML nebulizer solution Commonly known as: ACCUNEB PLEASE SEE ATTACHED FOR DETAILED DIRECTIONS   apixaban 5 MG Tabs tablet Commonly known as: ELIQUIS Take 5 mg by mouth 2 (two) times daily.   apixaban 5 MG Tabs tablet Commonly known as: ELIQUIS Take 1 tablet by mouth 2 (two) times daily.   atorvastatin 80 MG tablet Commonly known as: LIPITOR Take 80 mg by mouth at bedtime.   carvedilol 6.25 MG tablet Commonly known as: COREG Take 6.25 mg by mouth 2 (two) times daily.   digoxin 0.125 MG tablet Commonly known as: LANOXIN Take 125 mcg by mouth every other day.   EPINEPHrine 0.3 mg/0.3 mL Soaj injection Commonly known as: EPI-PEN Use as directed for severe allergic reaction.   esomeprazole 40 MG capsule Commonly known as: NEXIUM TAKE 1 CAPSULE TWICE DAILY 30 MINUTES PRIOR TO BREAKFAST AND DINNER.   ferrous sulfate 325 (65 FE) MG EC tablet Take 325 mg by mouth daily.   Fifty50 Glucose Meter 2.0 w/Device Kit See admin instructions.   fluticasone 44 MCG/ACT inhaler Commonly known as: FLOVENT HFA INHALE 2 PUFFS EVERY DAY   fluticasone 50 MCG/ACT nasal spray Commonly known as: FLONASE Place into  the nose.   folic acid 1 MG tablet Commonly known as: FOLVITE Take 2 tablets (2 mg total) by mouth daily.   folic acid 220 MCG tablet Commonly known as: FOLVITE Take by mouth.   furosemide 40 MG tablet Commonly known as: LASIX Take 40 mg by mouth as needed.   gabapentin 300 MG capsule Commonly known as: NEURONTIN Take 600 mg by mouth at bedtime.   glipiZIDE 5 MG 24 hr tablet Commonly known as: GLUCOTROL XL Take by mouth.   hydrALAZINE 10 MG tablet Commonly known as: APRESOLINE Take by mouth.   levocetirizine 5 MG tablet Commonly known as: XYZAL Take by mouth.   losartan 50 MG tablet Commonly known as:  COZAAR   losartan 100 MG tablet Commonly known as: COZAAR Take 100 mg by mouth daily.   magnesium oxide 400 MG tablet Commonly known as: MAG-OX Take by mouth.   Multi-Vitamin tablet Take 1 tablet by mouth daily.   nitroGLYCERIN 0.4 MG SL tablet Commonly known as: NITROSTAT Place under the tongue.   olopatadine 0.1 % ophthalmic solution Commonly known as: PATANOL 1 drop 2 times daily.   sertraline 100 MG tablet Commonly known as: ZOLOFT Take 100 mg by mouth daily. What changed: Another medication with the same name was removed. Continue taking this medication, and follow the directions you see here. Changed by: Lottie Dawson, NP   spironolactone 25 MG tablet Commonly known as: ALDACTONE Take 12.5 mg by mouth daily.   Synjardy XR 12.08-998 MG Tb24 Generic drug: Empagliflozin-metFORMIN HCl ER Take 1 tablet by mouth 2 (two) times daily.   ticagrelor 90 MG Tabs tablet Commonly known as: BRILINTA TAKE 1 TABLET (90 MG TOTAL) BY MOUTH 2 (TWO) TIMES DAILY.   tiZANidine 4 MG tablet Commonly known as: ZANAFLEX Take 2-4 mg by mouth 2 (two) times daily as needed. What changed: Another medication with the same name was removed. Continue taking this medication, and follow the directions you see here. Changed by: Lottie Dawson, NP        Allergies:  Allergies  Allergen Reactions   Ciprofloxacin Hives and Swelling   Lisinopril Hives, Itching and Swelling    Face swelling    Metformin And Related Diarrhea and Nausea And Vomiting    Past Medical History, Surgical history, Social history, and Family History were reviewed and updated.  Review of Systems: All other 10 point review of systems is negative.   Physical Exam:  height is _0  (1.651 m) and weight is 150 lb (68 kg). Her oral temperature is 98.7 F (37.1 C). Her blood pressure is 137/77 and her pulse is 68. Her respiration is 17 and oxygen saturation is 100%.   Wt Readings from Last 3 Encounters:  03/26/21 150  lb (68 kg)  01/04/21 147 lb (66.7 kg)  01/04/21 147 lb (66.7 kg)    Ocular: Sclerae unicteric, pupils equal, round and reactive to light Ear-nose-throat: Oropharynx clear, dentition fair Lymphatic: No cervical or supraclavicular adenopathy Lungs no rales or rhonchi, good excursion bilaterally Heart regular rate and rhythm, no murmur appreciated Abd soft, nontender, positive bowel sounds MSK no focal spinal tenderness, no joint edema Neuro: non-focal, well-oriented, appropriate affect Breasts: Deferred   Lab Results  Component Value Date   WBC 4.4 03/26/2021   HGB 11.5 (L) 03/26/2021   HCT 33.6 (L) 03/26/2021   MCV 96.3 03/26/2021   PLT 217 03/26/2021   Lab Results  Component Value Date   FERRITIN 232 09/24/2020   IRON 60  09/24/2020   TIBC 278 09/24/2020   UIBC 218 09/24/2020   IRONPCTSAT 22 09/24/2020   Lab Results  Component Value Date   RETICCTPCT 1.5 03/26/2021   RBC 3.49 (L) 03/26/2021   No results found for: KPAFRELGTCHN, LAMBDASER, KAPLAMBRATIO No results found for: IGGSERUM, IGA, IGMSERUM No results found for: Odetta Pink, SPEI   Chemistry      Component Value Date/Time   NA 139 03/14/2019 1342   NA 146 (H) 01/18/2017 1310   K 5.6 (H) 03/14/2019 1342   K 4.8 (H) 01/18/2017 1310   CL 106 03/14/2019 1342   CL 107 01/18/2017 1310   CO2 27 03/14/2019 1342   CO2 27 01/18/2017 1310   BUN 21 (H) 03/14/2019 1342   BUN 21 01/18/2017 1310   CREATININE 1.41 (H) 03/14/2019 1342   CREATININE 1.4 (H) 01/18/2017 1310      Component Value Date/Time   CALCIUM 9.4 03/14/2019 1342   CALCIUM 9.2 01/18/2017 1310   ALKPHOS 139 (H) 03/13/2019 0855   ALKPHOS 100 (H) 01/18/2017 1310   AST 29 03/13/2019 0855   ALT 28 03/13/2019 0855   ALT 19 01/18/2017 1310   BILITOT 0.6 03/13/2019 0855       Impression and Plan: Kimberly Ramos is a very pleasant 50 yo African American female with iron deficiency anemia secondary to  malabsorption after gastric bypass in 2018. Iron studies are pending. We will replace if needed.  Folic acid refilled. Follow-up in 6 months.  She can contact our office with any questions or concerns.   Lottie Dawson, NP 11/30/20229:04 AM

## 2021-04-23 ENCOUNTER — Encounter (HOSPITAL_BASED_OUTPATIENT_CLINIC_OR_DEPARTMENT_OTHER): Payer: Self-pay

## 2021-04-23 ENCOUNTER — Emergency Department (HOSPITAL_BASED_OUTPATIENT_CLINIC_OR_DEPARTMENT_OTHER): Payer: Medicare Other

## 2021-04-23 ENCOUNTER — Other Ambulatory Visit: Payer: Self-pay

## 2021-04-23 ENCOUNTER — Emergency Department (HOSPITAL_BASED_OUTPATIENT_CLINIC_OR_DEPARTMENT_OTHER)
Admission: EM | Admit: 2021-04-23 | Discharge: 2021-04-23 | Disposition: A | Discharge status: Home / Self Care | Payer: Medicare Other | Attending: Emergency Medicine | Admitting: Emergency Medicine

## 2021-04-23 DIAGNOSIS — Z7951 Long term (current) use of inhaled steroids: Secondary | ICD-10-CM | POA: Insufficient documentation

## 2021-04-23 DIAGNOSIS — J4530 Mild persistent asthma, uncomplicated: Secondary | ICD-10-CM | POA: Diagnosis not present

## 2021-04-23 DIAGNOSIS — Z7901 Long term (current) use of anticoagulants: Secondary | ICD-10-CM | POA: Diagnosis not present

## 2021-04-23 DIAGNOSIS — E119 Type 2 diabetes mellitus without complications: Secondary | ICD-10-CM | POA: Diagnosis not present

## 2021-04-23 DIAGNOSIS — J069 Acute upper respiratory infection, unspecified: Secondary | ICD-10-CM | POA: Diagnosis not present

## 2021-04-23 DIAGNOSIS — I2510 Atherosclerotic heart disease of native coronary artery without angina pectoris: Secondary | ICD-10-CM | POA: Diagnosis not present

## 2021-04-23 DIAGNOSIS — Z20822 Contact with and (suspected) exposure to covid-19: Secondary | ICD-10-CM | POA: Insufficient documentation

## 2021-04-23 DIAGNOSIS — J3489 Other specified disorders of nose and nasal sinuses: Secondary | ICD-10-CM | POA: Insufficient documentation

## 2021-04-23 DIAGNOSIS — Z79899 Other long term (current) drug therapy: Secondary | ICD-10-CM | POA: Insufficient documentation

## 2021-04-23 DIAGNOSIS — I10 Essential (primary) hypertension: Secondary | ICD-10-CM | POA: Insufficient documentation

## 2021-04-23 DIAGNOSIS — R059 Cough, unspecified: Secondary | ICD-10-CM | POA: Diagnosis present

## 2021-04-23 DIAGNOSIS — Z7984 Long term (current) use of oral hypoglycemic drugs: Secondary | ICD-10-CM | POA: Diagnosis not present

## 2021-04-23 DIAGNOSIS — Z87891 Personal history of nicotine dependence: Secondary | ICD-10-CM | POA: Insufficient documentation

## 2021-04-23 LAB — RESP PANEL BY RT-PCR (FLU A&B, COVID) ARPGX2
Influenza A by PCR: NEGATIVE
Influenza B by PCR: NEGATIVE
SARS Coronavirus 2 by RT PCR: NEGATIVE

## 2021-04-23 MED ORDER — BENZONATATE 200 MG PO CAPS: 200.0000 mg | ORAL_CAPSULE | Freq: Three times a day (TID) | ORAL | 0 refills | Status: AC | PRN

## 2021-04-23 MED ORDER — ALBUTEROL SULFATE HFA 108 (90 BASE) MCG/ACT IN AERS
2.0000 | INHALATION_SPRAY | Freq: Once | RESPIRATORY_TRACT | Status: AC
  Administered 2021-04-23: 21:00:00 2 via RESPIRATORY_TRACT
  Filled 2021-04-23: qty 6.7

## 2021-04-23 MED ORDER — BENZONATATE 100 MG PO CAPS
200.0000 mg | ORAL_CAPSULE | Freq: Once | ORAL | Status: AC
  Administered 2021-04-23: 21:00:00 200 mg via ORAL
  Filled 2021-04-23: qty 2

## 2021-04-23 NOTE — Discharge Instructions (Addendum)
Your symptoms are likely caused by a viral upper respiratory infection. Antibiotics are not helpful in treating viral infection, the virus should run its course in about 5-7 days. Please make sure you are drinking plenty of fluids. You can treat your symptoms supportively with tylenol/ibuprofen for fevers and pains, Zyrtec and Flonase to help with nasal congestion, and over the counter cough syrups and throat lozenges to help with cough. If your symptoms are not improving please follow up with you Primary doctor.   If you develop persistent fevers, shortness of breath or difficulty breathing, chest pain, severe headache and neck pain, persistent nausea and vomiting or other new or concerning symptoms return to the Emergency department.  

## 2021-04-23 NOTE — ED Provider Notes (Signed)
[facility] EMERGENCY DEPARTMENT Provider Note   CSN: [id number] Arrival date & time: 04/23/21  1622     History Chief Complaint  Patient presents with   Cough    Kimberly Ramos is a 50 y.o. female.  Kimberly Ramos is a 51 y.o. female with a history of hypertension, diabetes, CAD, asthma, who presents to the emergency department for evaluation of 2 days of cough, congestion, sinus pressure, headache.  Patient has not had any known fevers but has had some chills.  Denies sore throat.  No chest pain or shortness of breath.  No abdominal pain, nausea, vomiting or diarrhea.  Patient denies any known sick contacts.  She has been taking over-the-counter cold and sinus medications with some improvement.  No other aggravating or alleviating factors.  The history is provided by the patient and medical records.      Past Medical History:  Diagnosis Date   Arthritis    Asthma    CAD S/P percutaneous coronary angioplasty 06/2012   2 vessel disease old sub totaled occ. of RCA and subtotal occ. of LAD   Diabetes mellitus without complication (HCC)    GERD (gastroesophageal reflux disease)    H/O hiatal hernia    Hypertension    Morbid obesity with BMI of 45.0-49.9, adult ([place])    NSTEMI (non-ST elevated myocardial infarction) ([place]) 06/2012; Jan 2017   OSA on CPAP    Presence of drug coated stent in LAD coronary artery 06/2102; Jan 2017   a. for chronic subtotal LAD occlusion placed 06/2012; b. NSTEMI - PCI LAD-D1 bifurcation [facility])   Stroke [facility])    Tobacco use disorder 07/07/2012    Patient Active Problem List   Diagnosis Date Noted   IDA (iron deficiency anemia) 01/19/2017   Allergic conjunctivitis 11/12/2016   History of food allergy 11/12/2016   Epistaxis 05/27/2016   Acute sinusitis 01/23/2016   Mild persistent asthma 01/23/2016   Allergic rhinitis 01/23/2016   DOE (dyspnea on exertion) 02/17/2015   Pulmonary HTN ([place]) 02/17/2015   Edema extremities 02/17/2015    Severe obesity (BMI >= 40) ([place]) 04/06/2013   Hyperlipidemia LDL goal <70 04/06/2013   Callus of foot 09/21/2012   Tinea pedis 09/21/2012   Presence of drug coated stent in LAD coronary artery - From Prox LAD, across D1 into previously Chronically "subtotal" occluded mid LAD 07/21/2012   CAD (coronary artery disease), LAD and 100% occluded RCA  -- PCI to Subtotal LAD CTO 07/04/12 DES stetn. 07/04/2012   Diabetes mellitus type 2, in the obese -- insulin dependent,  07/04/2012   Essential hypertension 07/04/2012   History of: NSTEMI (non-ST elevated myocardial infarction), 07/07/12 07/04/2012    Past Surgical History:  Procedure Laterality Date   CARDIAC CATHETERIZATION  07/05/2012   07/05/12  + CAD   CARDIAC CATHETERIZATION  2010   RCA stenosis with collaterals   CARDIAC CATHETERIZATION  Apr 30, 2015   [facility] - Severe LAD stenosis @ prox edge of stent --> PCI   carpel tunnel     LEFT HEART CATHETERIZATION WITH CORONARY ANGIOGRAM N/A 07/05/2012   Procedure: LEFT HEART CATHETERIZATION WITH CORONARY ANGIOGRAM;  Surgeon: Troy Sine, MD;  Location: [facility] CATH LAB;  Service: Cardiovascular;  LAD Subtototalled after D1 (not noticed in 2010, but present), RCA CTO.   MYOCARDIAL PERFUSION IMAGING  07/15/2012   EF 57% lge area ant and septal ischemia   PERCUTANEOUS CORONARY STENT INTERVENTION (PCI-S) N/A 07/20/2012   Procedure:  PERCUTANEOUS CORONARY STENT INTERVENTION (PCI-S);  Surgeon: Leonie Man, MD;  Location: [facility] C[facility] LAB;  PCI of LAD Promus Premier 2.25 mm x 20 mm ( 2.65 mm prox)   PERCUTANEOUS CORONARY STENT INTERVENTION (PCI-S)  May 01, 2015   [facility] - PCI of LAD (prox of prior stent), & ? D1. (DES)      OB History   No obstetric history on file.     Family History  Problem Relation Age of Onset   Diabetes Mother    Diabetes Father    Heart attack Maternal Grandmother    Diabetes Maternal Grandmother     Social History   Tobacco Use   Smoking status: Former    Types:  Cigarettes    Quit date: 07/04/2012    Years since quitting: 8.8   Smokeless tobacco: Never  Vaping Use   Vaping Use: Never used  Substance Use Topics   Alcohol use: Yes    Comment: occasional   Drug use: Yes    Types: Marijuana    Home Medications Prior to Admission medications   Medication Sig Start Date End Date Taking? Authorizing Provider  benzonatate (TESSALON) 200 MG capsule Take 1 capsule (200 mg total) by mouth 3 (three) times daily as needed for cough. 04/23/21  Yes Jacqlyn Larsen, PA-C  albuterol (ACCUNEB) 1.25 MG/3ML nebulizer solution PLEASE SEE ATTACHED FOR DETAILED DIRECTIONS 02/24/18   [provider]  albuterol (PROVENTIL HFA;VENTOLIN HFA) 108 (90 Base) MCG/ACT inhaler Inhale into the lungs.    [provider]  apixaban (ELIQUIS) 5 MG TABS tablet Take 5 mg by mouth 2 (two) times daily. 02/10/18   [provider]  apixaban (ELIQUIS) 5 MG TABS tablet Take 1 tablet by mouth 2 (two) times daily. 11/23/19   [provider]  atorvastatin (LIPITOR) 80 MG tablet Take 80 mg by mouth at bedtime. 08/30/16   [provider]  Blood Glucose Monitoring Suppl (FIFTY50 GLUCOSE METER 2.0) w/Device KIT See admin instructions. 06/18/20   [provider]  carvedilol (COREG) 6.25 MG tablet Take 6.25 mg by mouth 2 (two) times daily. 03/14/18   [provider]  digoxin (LANOXIN) 0.125 MG tablet Take 125 mcg by mouth every other day. 07/01/19   [provider]  EPINEPHrine 0.3 mg/0.3 mL IJ SOAJ injection Use as directed for severe allergic reaction. 10/14/16   Valentina Shaggy, MD  esomeprazole (NEXIUM) 40 MG capsule TAKE 1 CAPSULE TWICE DAILY 30 MINUTES PRIOR TO BREAKFAST AND DINNER. 10/28/14   [provider]  ferrous sulfate 325 (65 FE) MG EC tablet Take 325 mg by mouth daily. 02/10/18   [provider]  fluticasone (FLONASE) 50 MCG/ACT nasal spray Place into the nose.    [provider]  fluticasone  (FLOVENT HFA) 44 MCG/ACT inhaler INHALE 2 PUFFS EVERY DAY 11/14/14   [provider]  folic acid (FOLVITE) 1 MG tablet Take 2 tablets (2 mg total) by mouth daily. 03/26/21   Celso Amy, NP  furosemide (LASIX) 40 MG tablet Take 40 mg by mouth as needed. 08/18/16   [provider]  gabapentin (NEURONTIN) 300 MG capsule Take 600 mg by mouth at bedtime. 05/13/17   [provider]  glipiZIDE (GLUCOTROL XL) 5 MG 24 hr tablet Take by mouth. 10/06/19   [provider]  hydrALAZINE (APRESOLINE) 10 MG tablet Take by mouth. 01/02/20   [provider]  levocetirizine (XYZAL) 5 MG tablet Take by mouth.    [provider]  losartan (COZAAR) 100 MG tablet Take 100 mg by mouth daily. 01/04/20   [provider]  losartan (COZAAR) 50 MG tablet  03/23/18   [provider]  magnesium oxide (MAG-OX) 400 MG tablet Take by mouth. 03/14/18   [provider]  Multiple Vitamin (MULTI-VITAMIN) tablet Take 1 tablet by mouth daily.    [provider]  nitroGLYCERIN (NITROSTAT) 0.4 MG SL tablet Place under the tongue. 08/20/14   [provider]  olopatadine (PATANOL) 0.1 % ophthalmic solution 1 drop 2 times daily.    [provider]  sertraline (ZOLOFT) 100 MG tablet Take 100 mg by mouth daily. 08/20/19   [provider]  spironolactone (ALDACTONE) 25 MG tablet Take 12.5 mg by mouth daily. 12/05/18   [provider]  SYNJARDY XR 12.08-998 MG TB24 Take 1 tablet by mouth 2 (two) times daily. 09/06/19   [provider]  ticagrelor (BRILINTA) 90 MG TABS tablet TAKE 1 TABLET (90 MG TOTAL) BY MOUTH 2 (TWO) TIMES DAILY. 09/09/15   [provider]  tiZANidine (ZANAFLEX) 4 MG tablet Take 2-4 mg by mouth 2 (two) times daily as needed. 01/30/20   [provider]    Allergies    Ciprofloxacin, Lisinopril, and Metformin and related  Review of Systems   Review of Systems  Constitutional:   Positive for chills. Negative for fever.  HENT:  Positive for congestion, rhinorrhea and sinus pressure. Negative for sore throat.   Respiratory:  Positive for cough. Negative for shortness of breath.   Cardiovascular:  Negative for chest pain.  Gastrointestinal:  Negative for abdominal pain, diarrhea, nausea and vomiting.  Musculoskeletal:  Negative for myalgias, neck pain and neck stiffness.  Skin:  Negative for color change and rash.  Neurological:  Positive for headaches.  All other systems reviewed and are negative.  Physical Exam Updated Vital Signs BP (!) 145/89 (BP Location: Left Arm)    Pulse 69    Temp 98.9 F (37.2 C) (Oral)    Resp 18    Ht _0  (1.651 m)    Wt 68.5 kg    SpO2 100%    BMI 25.13 kg/m   Physical Exam Vitals and nursing note reviewed.  Constitutional:      General: She is not in acute distress.    Appearance: She is well-developed. She is not ill-appearing or diaphoretic.  HENT:     Head: Normocephalic and atraumatic.     Right Ear: Tympanic membrane and ear canal normal.     Left Ear: Tympanic membrane and ear canal normal.     Nose: Congestion and rhinorrhea present.     Comments: Tenderness over the maxillary sinuses    Mouth/Throat:     Mouth: Mucous membranes are moist.     Pharynx: Oropharynx is clear.  Eyes:     General:        Right eye: No discharge.        Left eye: No discharge.  Neck:     Comments: No rigidity Cardiovascular:     Rate and Rhythm: Normal rate and regular rhythm.     Heart sounds: Normal heart sounds. No murmur heard.   No friction rub. No gallop.  Pulmonary:     Effort: Pulmonary effort is normal. No respiratory distress.     Breath sounds: Wheezing present.     Comments: Respirations equal and unlabored, patient able to speak in full sentences, lungs clear to auscultation bilaterally aside from a few  faint scattered wheezes. Abdominal:     General: Bowel sounds are normal. There is no distension.     Palpations:  Abdomen is soft. There is no mass.     Tenderness: There is no abdominal tenderness. There is no guarding.     Comments: Abdomen soft, nondistended, nontender to palpation in all quadrants without guarding or peritoneal signs  Musculoskeletal:        General: No deformity.     Cervical back: Neck supple.  Lymphadenopathy:     Cervical: No cervical adenopathy.  Skin:    General: Skin is warm and dry.     Capillary Refill: Capillary refill takes less than 2 seconds.  Neurological:     Mental Status: She is alert and oriented to person, place, and time.  Psychiatric:        Mood and Affect: Mood normal.        Behavior: Behavior normal.    ED Results / Procedures / Treatments   Labs (all labs ordered are listed, but only abnormal results are displayed) Labs Reviewed  RESP PANEL BY RT-PCR (FLU A&B, COVID) [id number]    EKG None  Radiology DG Chest 2 View  Result Date: 04/23/2021 CLINICAL DATA:  Cough and flu like symptoms for 2 days. EXAM: CHEST - 2 VIEW COMPARISON:  10/28/2018 FINDINGS: Cardiac pacemaker. Heart size and pulmonary vascularity are normal. Peribronchial thickening with central and basilar interstitial changes suggesting airways disease versus bronchiolitis. Interstitial pneumonitis is also possible. No focal consolidation. No pleural effusions. No pneumothorax. Mediastinal contours appear intact. IMPRESSION: Peribronchial thickening with central and basilar interstitial changes suggesting airways disease versus bronchiolitis. No focal consolidation. Electronically Signed   By: Lucienne Capers M.D.   On: 04/23/2021 21:06    Procedures Procedures   Medications Ordered in ED Medications  albuterol (VENTOLIN HFA) 108 (90 Base) MCG/ACT inhaler 2 puff (2 puffs Inhalation Given 04/23/21 2100)  benzonatate (TESSALON) capsule 200 mg (200 mg Oral Given 04/23/21 2100)    ED Course  I have reviewed the triage vital signs and the nursing notes.  Pertinent labs & imaging  results that were available during my care of the patient were reviewed by me and considered in my medical decision making (see chart for details).    MDM Rules/Calculators/A&P                         Pt presents with nasal congestion and cough. Pt is well appearing and vitals are normal. Lungs CTA on exam aside from a few faint scattered wheezes that cleared with albuterol treatment. Pt CXR negative for acute infiltrate. Patients symptoms are consistent with URI, likely viral etiology.  COVID and flu testing negative discussed that antibiotics are not indicated for viral infections. Pt will be discharged with symptomatic treatment.  Verbalizes understanding and is agreeable with plan. Pt is hemodynamically stable & in NAD prior to dc. Return precautions discussed, pt expresses understanding and agrees with plan.    Final Clinical Impression(s) / ED Diagnoses Final diagnoses:  Viral URI with cough    Rx / DC Orders ED Discharge Orders          Ordered    benzonatate (TESSALON) 200 MG capsule  3 times daily PRN        04/23/21 2132             Jacqlyn Larsen, PA-C 04/24/21 0018    Lorelle Gibbs, DO 04/24/21 1531

## 2021-04-23 NOTE — ED Notes (Signed)
Patient transported to X-ray 

## 2021-04-23 NOTE — ED Notes (Signed)
ED Provider at bedside. 

## 2021-04-23 NOTE — ED Triage Notes (Signed)
Pt c/o flu like sx day 2-NAD-steady gait 

## 2021-09-24 ENCOUNTER — Encounter: Payer: Self-pay | Admitting: Family

## 2021-09-24 ENCOUNTER — Inpatient Hospital Stay: Payer: Medicare Other | Attending: Hematology & Oncology

## 2021-09-24 ENCOUNTER — Inpatient Hospital Stay (HOSPITAL_BASED_OUTPATIENT_CLINIC_OR_DEPARTMENT_OTHER): Payer: Medicare Other | Admitting: Family

## 2021-09-24 VITALS — BP 121/74 | HR 62 | Temp 98.4°F | Resp 17 | Wt 140.1 lb

## 2021-09-24 DIAGNOSIS — D508 Other iron deficiency anemias: Secondary | ICD-10-CM

## 2021-09-24 DIAGNOSIS — Z9884 Bariatric surgery status: Secondary | ICD-10-CM | POA: Diagnosis not present

## 2021-09-24 DIAGNOSIS — K909 Intestinal malabsorption, unspecified: Secondary | ICD-10-CM | POA: Insufficient documentation

## 2021-09-24 DIAGNOSIS — D509 Iron deficiency anemia, unspecified: Secondary | ICD-10-CM | POA: Insufficient documentation

## 2021-09-24 DIAGNOSIS — Z7901 Long term (current) use of anticoagulants: Secondary | ICD-10-CM | POA: Diagnosis not present

## 2021-09-24 DIAGNOSIS — R5383 Other fatigue: Secondary | ICD-10-CM | POA: Insufficient documentation

## 2021-09-24 LAB — CBC WITH DIFFERENTIAL (CANCER CENTER ONLY)
Abs Immature Granulocytes: 0.01 10*3/uL (ref 0.00–0.07)
Basophils Absolute: 0 10*3/uL (ref 0.0–0.1)
Basophils Relative: 1 %
Eosinophils Absolute: 0.2 10*3/uL (ref 0.0–0.5)
Eosinophils Relative: 3 %
HCT: 33.7 % — ABNORMAL LOW (ref 36.0–46.0)
Hemoglobin: 11.1 g/dL — ABNORMAL LOW (ref 12.0–15.0)
Immature Granulocytes: 0 %
Lymphocytes Relative: 34 %
Lymphs Abs: 1.5 10*3/uL (ref 0.7–4.0)
MCH: 32.6 pg (ref 26.0–34.0)
MCHC: 32.9 g/dL (ref 30.0–36.0)
MCV: 98.8 fL (ref 80.0–100.0)
Monocytes Absolute: 0.7 10*3/uL (ref 0.1–1.0)
Monocytes Relative: 15 %
Neutro Abs: 2.1 10*3/uL (ref 1.7–7.7)
Neutrophils Relative %: 47 %
Platelet Count: 242 10*3/uL (ref 150–400)
RBC: 3.41 MIL/uL — ABNORMAL LOW (ref 3.87–5.11)
RDW: 15 % (ref 11.5–15.5)
WBC Count: 4.5 10*3/uL (ref 4.0–10.5)
nRBC: 0 % (ref 0.0–0.2)

## 2021-09-24 LAB — IRON AND IRON BINDING CAPACITY (CC-WL,HP ONLY)
Iron: 126 ug/dL (ref 28–170)
Saturation Ratios: 40 % — ABNORMAL HIGH (ref 10.4–31.8)
TIBC: 319 ug/dL (ref 250–450)
UIBC: 193 ug/dL (ref 148–442)

## 2021-09-24 LAB — FERRITIN: Ferritin: 189 ng/mL (ref 11–307)

## 2021-09-24 LAB — RETICULOCYTES
Immature Retic Fract: 18.4 % — ABNORMAL HIGH (ref 2.3–15.9)
RBC.: 3.39 MIL/uL — ABNORMAL LOW (ref 3.87–5.11)
Retic Count, Absolute: 40 10*3/uL (ref 19.0–186.0)
Retic Ct Pct: 1.2 % (ref 0.4–3.1)

## 2021-09-24 NOTE — Progress Notes (Signed)
Hematology and Oncology Follow Up Visit  Kimberly Ramos [id number] 06/14/1970 51 y.o. 09/24/2021   Principle Diagnosis:  Iron deficiency anemia secondary to malabsorption (gastric sleeve 2018) Hyperhomocystinemia    Current Therapy:        IV iron as indicated  Folic acid 2 mg PO daily   Interim History:  Kimberly Ramos is here today for follow-up. She is doing well but notes occasional fatigue.  No blood loss noted.  She does bruise easily on Eliquis and Brilinta. No petechiae.  No fever, chills, n/v, cough, rash, dizziness, SOB, chest pain, palpitations, abdominal pain or changes in bowel or bladder habits.  No swelling, tenderness, numbness or tingling in her extremities.  No falls or syncope.  Appetite and hydration are good. Weight is stable at 140 lbs.   ECOG Performance Status: 1 - Symptomatic but completely ambulatory  Medications:  Allergies as of 09/24/2021       Reactions   Ciprofloxacin Hives, Swelling   Lisinopril Hives, Itching, Swelling   Face swelling   Metformin And Related Diarrhea, Nausea And Vomiting        Medication List        Accurate as of Sep 24, 2021  8:52 AM. If you have any questions, ask your nurse or doctor.          albuterol 108 (90 Base) MCG/ACT inhaler Commonly known as: VENTOLIN HFA Inhale into the lungs.   albuterol 1.25 MG/3ML nebulizer solution Commonly known as: ACCUNEB PLEASE SEE ATTACHED FOR DETAILED DIRECTIONS   apixaban 5 MG Tabs tablet Commonly known as: ELIQUIS Take 5 mg by mouth 2 (two) times daily.   apixaban 5 MG Tabs tablet Commonly known as: ELIQUIS Take 1 tablet by mouth 2 (two) times daily.   atorvastatin 80 MG tablet Commonly known as: LIPITOR Take 80 mg by mouth at bedtime.   benzonatate 200 MG capsule Commonly known as: TESSALON Take 1 capsule (200 mg total) by mouth 3 (three) times daily as needed for cough.   carvedilol 6.25 MG tablet Commonly known as: COREG Take 6.25 mg by mouth 2 (two) times  daily.   digoxin 0.125 MG tablet Commonly known as: LANOXIN Take 125 mcg by mouth every other day.   EPINEPHrine 0.3 mg/0.3 mL Soaj injection Commonly known as: EPI-PEN Use as directed for severe allergic reaction.   esomeprazole 40 MG capsule Commonly known as: NEXIUM TAKE 1 CAPSULE TWICE DAILY 30 MINUTES PRIOR TO BREAKFAST AND DINNER.   ferrous sulfate 325 (65 FE) MG EC tablet Take 325 mg by mouth daily.   Fifty50 Glucose Meter 2.0 w/Device Kit See admin instructions.   fluticasone 44 MCG/ACT inhaler Commonly known as: FLOVENT HFA INHALE 2 PUFFS EVERY DAY   fluticasone 50 MCG/ACT nasal spray Commonly known as: FLONASE Place into the nose.   folic acid 1 MG tablet Commonly known as: FOLVITE Take 2 tablets (2 mg total) by mouth daily.   furosemide 40 MG tablet Commonly known as: LASIX Take 40 mg by mouth as needed.   gabapentin 300 MG capsule Commonly known as: NEURONTIN Take 600 mg by mouth at bedtime.   glipiZIDE 5 MG 24 hr tablet Commonly known as: GLUCOTROL XL Take by mouth.   hydrALAZINE 10 MG tablet Commonly known as: APRESOLINE Take by mouth.   levocetirizine 5 MG tablet Commonly known as: XYZAL Take by mouth.   losartan 50 MG tablet Commonly known as: COZAAR   losartan 100 MG tablet Commonly known as: COZAAR Take 100   mg by mouth daily.   magnesium oxide 400 MG tablet Commonly known as: MAG-OX Take by mouth.   Multi-Vitamin tablet Take 1 tablet by mouth daily.   nitroGLYCERIN 0.4 MG SL tablet Commonly known as: NITROSTAT Place under the tongue.   olopatadine 0.1 % ophthalmic solution Commonly known as: PATANOL 1 drop 2 times daily.   sertraline 100 MG tablet Commonly known as: ZOLOFT Take 100 mg by mouth daily.   spironolactone 25 MG tablet Commonly known as: ALDACTONE Take 12.5 mg by mouth daily.   Synjardy XR 12.08-998 MG Tb24 Generic drug: Empagliflozin-metFORMIN HCl ER Take 1 tablet by mouth 2 (two) times daily.    ticagrelor 90 MG Tabs tablet Commonly known as: BRILINTA TAKE 1 TABLET (90 MG TOTAL) BY MOUTH 2 (TWO) TIMES DAILY.   tiZANidine 4 MG tablet Commonly known as: ZANAFLEX Take 2-4 mg by mouth 2 (two) times daily as needed.        Allergies:  Allergies  Allergen Reactions   Ciprofloxacin Hives and Swelling   Lisinopril Hives, Itching and Swelling    Face swelling    Metformin And Related Diarrhea and Nausea And Vomiting    Past Medical History, Surgical history, Social history, and Family History were reviewed and updated.  Review of Systems: All other 10 point review of systems is negative.   Physical Exam:  weight is 140 lb 1.9 oz (63.6 kg). Her oral temperature is 98.4 F (36.9 C). Her blood pressure is 121/74 and her pulse is 62. Her respiration is 17 and oxygen saturation is 100%.   Wt Readings from Last 3 Encounters:  09/24/21 140 lb 1.9 oz (63.6 kg)  04/23/21 151 lb (68.5 kg)  03/26/21 150 lb (68 kg)    Ocular: Sclerae unicteric, pupils equal, round and reactive to light Ear-nose-throat: Oropharynx clear, dentition fair Lymphatic: No cervical or supraclavicular adenopathy Lungs no rales or rhonchi, good excursion bilaterally Heart regular rate and rhythm, no murmur appreciated Abd soft, nontender, positive bowel sounds MSK no focal spinal tenderness, no joint edema Neuro: non-focal, well-oriented, appropriate affect Breasts: Deferred   Lab Results  Component Value Date   WBC 4.5 09/24/2021   HGB 11.1 (L) 09/24/2021   HCT 33.7 (L) 09/24/2021   MCV 98.8 09/24/2021   PLT 242 09/24/2021   Lab Results  Component Value Date   FERRITIN 125 03/26/2021   IRON 114 03/26/2021   TIBC 339 03/26/2021   UIBC 225 03/26/2021   IRONPCTSAT 34 (H) 03/26/2021   Lab Results  Component Value Date   RETICCTPCT 1.2 09/24/2021   RBC 3.41 (L) 09/24/2021   No results found for: KPAFRELGTCHN, LAMBDASER, KAPLAMBRATIO No results found for: IGGSERUM, IGA, IGMSERUM No  results found for: Kimberly Ramos, SPEI   Chemistry      Component Value Date/Time   NA 139 03/14/2019 1342   NA 146 (H) 01/18/2017 1310   K 5.6 (H) 03/14/2019 1342   K 4.8 (H) 01/18/2017 1310   CL 106 03/14/2019 1342   CL 107 01/18/2017 1310   CO2 27 03/14/2019 1342   CO2 27 01/18/2017 1310   BUN 21 (H) 03/14/2019 1342   BUN 21 01/18/2017 1310   CREATININE 1.41 (H) 03/14/2019 1342   CREATININE 1.4 (H) 01/18/2017 1310      Component Value Date/Time   CALCIUM 9.4 03/14/2019 1342   CALCIUM 9.2 01/18/2017 1310   ALKPHOS 139 (H) 03/13/2019 0855   ALKPHOS 100 (H) 01/18/2017 1310  AST 29 03/13/2019 0855   ALT 28 03/13/2019 0855   ALT 19 01/18/2017 1310   BILITOT 0.6 03/13/2019 0855       Impression and Plan: Ms. Antonopoulos is a very pleasant 51 yo African American female with iron deficiency anemia secondary to malabsorption after gastric bypass in 2018. Iron studies are pending.  Follow-up in 6 months.   Lottie Dawson, NP 5/31/20238:52 AM

## 2022-03-25 ENCOUNTER — Inpatient Hospital Stay: Payer: Medicare Other | Admitting: Family

## 2022-03-25 ENCOUNTER — Inpatient Hospital Stay: Payer: Medicare Other | Attending: Family

## 2022-06-24 IMAGING — DX DG CHEST 2V
2 series · 2 of 2 positions shown · non-contrast
Comparison: 10/28/2018

CLINICAL DATA: Cough and flu like symptoms for 2 days.

EXAM:
CHEST - 2 VIEW

[chest pa]
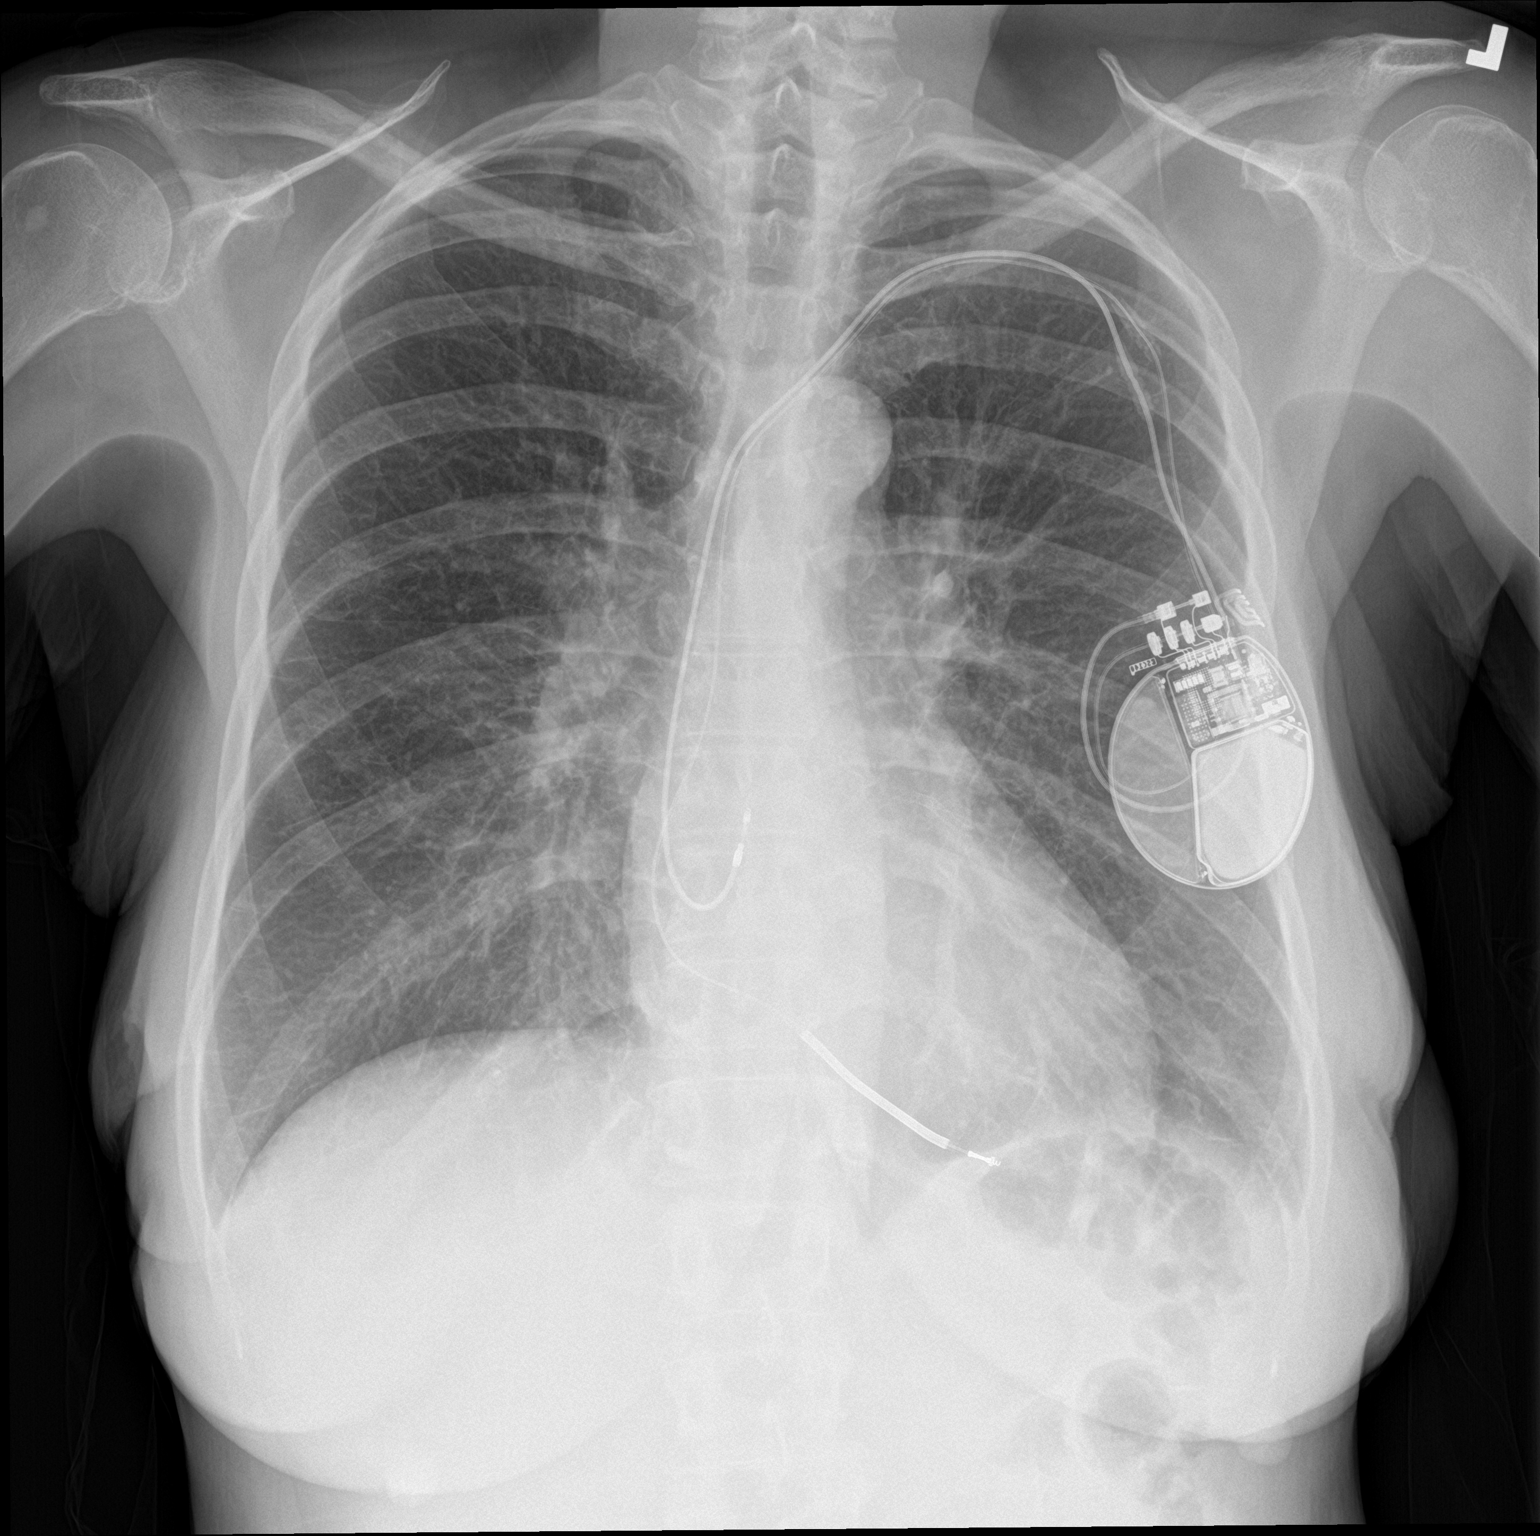

[chest lat]
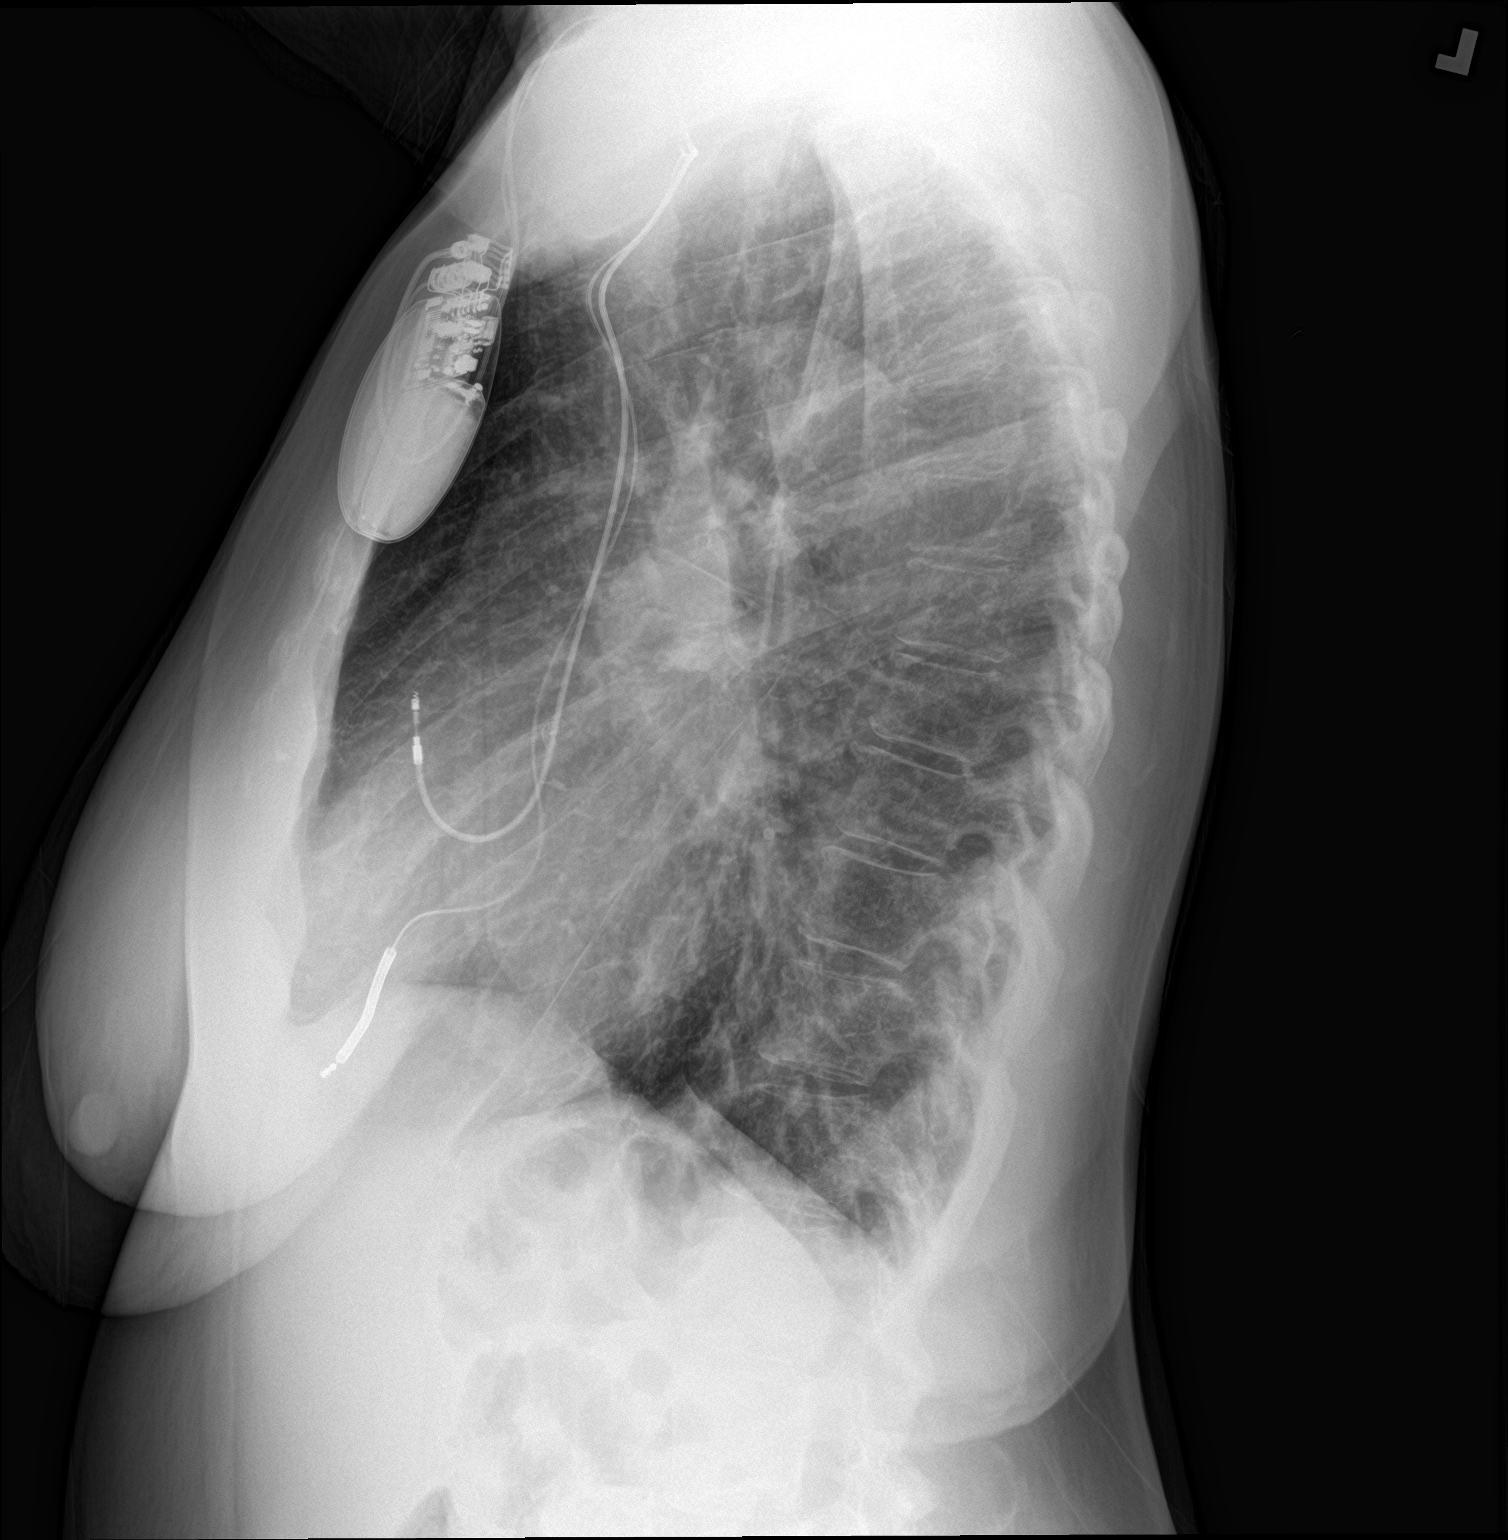

[2 of 2 positions shown; findings below may reference images not displayed]

FINDINGS: Cardiac pacemaker. Heart size and pulmonary vascularity are normal.
Peribronchial thickening with central and basilar interstitial
changes suggesting airways disease versus bronchiolitis.
Interstitial pneumonitis is also possible. No focal consolidation.
No pleural effusions. No pneumothorax. Mediastinal contours appear
intact.
IMPRESSION: Peribronchial thickening with central and basilar interstitial
changes suggesting airways disease versus bronchiolitis. No focal
consolidation.

## 2022-08-11 ENCOUNTER — Encounter: Payer: Self-pay | Admitting: Family

## 2022-08-14 ENCOUNTER — Encounter: Payer: Self-pay | Admitting: Family

## 2022-08-24 ENCOUNTER — Inpatient Hospital Stay: Payer: 59 | Attending: Family | Admitting: Family

## 2022-08-24 ENCOUNTER — Inpatient Hospital Stay: Payer: 59

## 2022-08-24 ENCOUNTER — Encounter: Payer: Self-pay | Admitting: Family

## 2022-08-24 VITALS — BP 109/69 | HR 64 | Temp 99.4°F | Resp 18 | Wt 141.0 lb

## 2022-08-24 DIAGNOSIS — D509 Iron deficiency anemia, unspecified: Secondary | ICD-10-CM | POA: Diagnosis present

## 2022-08-24 DIAGNOSIS — D508 Other iron deficiency anemias: Secondary | ICD-10-CM

## 2022-08-24 DIAGNOSIS — D500 Iron deficiency anemia secondary to blood loss (chronic): Secondary | ICD-10-CM

## 2022-08-24 DIAGNOSIS — Z9884 Bariatric surgery status: Secondary | ICD-10-CM | POA: Diagnosis not present

## 2022-08-24 DIAGNOSIS — K909 Intestinal malabsorption, unspecified: Secondary | ICD-10-CM | POA: Diagnosis not present

## 2022-08-24 LAB — CBC WITH DIFFERENTIAL (CANCER CENTER ONLY)
Abs Immature Granulocytes: 0.01 10*3/uL (ref 0.00–0.07)
Basophils Absolute: 0 10*3/uL (ref 0.0–0.1)
Basophils Relative: 1 %
Eosinophils Absolute: 0.3 10*3/uL (ref 0.0–0.5)
Eosinophils Relative: 5 %
HCT: 37.6 % (ref 36.0–46.0)
Hemoglobin: 12.2 g/dL (ref 12.0–15.0)
Immature Granulocytes: 0 %
Lymphocytes Relative: 42 %
Lymphs Abs: 2.1 10*3/uL (ref 0.7–4.0)
MCH: 31.4 pg (ref 26.0–34.0)
MCHC: 32.4 g/dL (ref 30.0–36.0)
MCV: 96.7 fL (ref 80.0–100.0)
Monocytes Absolute: 0.5 10*3/uL (ref 0.1–1.0)
Monocytes Relative: 11 %
Neutro Abs: 2.1 10*3/uL (ref 1.7–7.7)
Neutrophils Relative %: 41 %
Platelet Count: 223 10*3/uL (ref 150–400)
RBC: 3.89 MIL/uL (ref 3.87–5.11)
RDW: 14.1 % (ref 11.5–15.5)
WBC Count: 5 10*3/uL (ref 4.0–10.5)
nRBC: 0 % (ref 0.0–0.2)

## 2022-08-24 LAB — FERRITIN: Ferritin: 59 ng/mL (ref 11–307)

## 2022-08-24 LAB — RETICULOCYTES
Immature Retic Fract: 9.3 % (ref 2.3–15.9)
RBC.: 3.94 MIL/uL (ref 3.87–5.11)
Retic Count, Absolute: 42.2 10*3/uL (ref 19.0–186.0)
Retic Ct Pct: 1.1 % (ref 0.4–3.1)

## 2022-08-24 NOTE — Progress Notes (Signed)
Hematology and Oncology Follow Up Visit  Kimberly Ramos [id number] 09-13-1970 51 y.o. 08/24/2022   Principle Diagnosis:  Iron deficiency anemia secondary to malabsorption (gastric sleeve 2018) Hyperhomocystinemia    Current Therapy:        IV iron as indicated  Folic acid 1 mg PO daily   Interim History:  Kimberly Ramos is here today for follow-up. She is doing well. She has had some issues with GERD and is waiting to schedule an EGD and colonoscopy with GI once she gets clearance from cardiology.  She has not noted any blood loss. No bruising or petechiae.  No fever, chills, n/v, cough, rash, dizziness, SOB, chest pain, palpitations, abdominal pain or changes in bowel or bladder habits.  No swelling, tenderness, numbness or tingling in her extremities.  No falls or syncope reported.  Appetite and hydration are good. Weight is stable. 141 lbs.   ECOG Performance Status: 1 - Symptomatic but completely ambulatory  Medications:  Allergies as of 08/24/2022       Reactions   Ciprofloxacin Hives, Swelling   Lisinopril Hives, Itching, Swelling   Face swelling   Metformin And Related Diarrhea, Nausea And Vomiting        Medication List        Accurate as of Nadiya 29, 2024  2:00 PM. If you have any questions, ask your nurse or doctor.          albuterol 108 (90 Base) MCG/ACT inhaler Commonly known as: VENTOLIN HFA Inhale into the lungs.   albuterol 1.25 MG/3ML nebulizer solution Commonly known as: ACCUNEB PLEASE SEE ATTACHED FOR DETAILED DIRECTIONS   apixaban 5 MG Tabs tablet Commonly known as: ELIQUIS Take 5 mg by mouth 2 (two) times daily.   apixaban 5 MG Tabs tablet Commonly known as: ELIQUIS Take 1 tablet by mouth 2 (two) times daily.   atorvastatin 80 MG tablet Commonly known as: LIPITOR Take 80 mg by mouth at bedtime.   benzonatate 200 MG capsule Commonly known as: TESSALON Take 1 capsule (200 mg total) by mouth 3 (three) times daily as needed for cough.    carvedilol 6.25 MG tablet Commonly known as: COREG Take 6.25 mg by mouth 2 (two) times daily.   digoxin 0.125 MG tablet Commonly known as: LANOXIN Take 125 mcg by mouth every other day.   EPINEPHrine 0.3 mg/0.3 mL Soaj injection Commonly known as: EPI-PEN Use as directed for severe allergic reaction.   esomeprazole 40 MG capsule Commonly known as: NEXIUM TAKE 1 CAPSULE TWICE DAILY 30 MINUTES PRIOR TO BREAKFAST AND DINNER.   ferrous sulfate 325 (65 FE) MG EC tablet Take 325 mg by mouth daily.   Fifty50 Glucose Meter 2.0 w/Device Kit See admin instructions.   fluticasone 44 MCG/ACT inhaler Commonly known as: FLOVENT HFA INHALE 2 PUFFS EVERY DAY   fluticasone 50 MCG/ACT nasal spray Commonly known as: FLONASE Place into the nose.   folic acid 1 MG tablet Commonly known as: FOLVITE Take 2 tablets (2 mg total) by mouth daily.   furosemide 40 MG tablet Commonly known as: LASIX Take 40 mg by mouth as needed.   gabapentin 300 MG capsule Commonly known as: NEURONTIN Take 600 mg by mouth at bedtime.   glipiZIDE 5 MG 24 hr tablet Commonly known as: GLUCOTROL XL Take by mouth.   hydrALAZINE 10 MG tablet Commonly known as: APRESOLINE Take by mouth.   levocetirizine 5 MG tablet Commonly known as: XYZAL Take by mouth.   losartan 50 MG tablet  Commonly known as: COZAAR   losartan 100 MG tablet Commonly known as: COZAAR Take 100 mg by mouth daily.   magnesium oxide 400 MG tablet Commonly known as: MAG-OX Take by mouth.   Multi-Vitamin tablet Take 1 tablet by mouth daily.   nitroGLYCERIN 0.4 MG SL tablet Commonly known as: NITROSTAT Place under the tongue.   olopatadine 0.1 % ophthalmic solution Commonly known as: PATANOL 1 drop 2 times daily.   sertraline 100 MG tablet Commonly known as: ZOLOFT Take 100 mg by mouth daily.   spironolactone 25 MG tablet Commonly known as: ALDACTONE Take 12.5 mg by mouth daily.   Synjardy XR 12.08-998 MG Tb24 Generic  drug: Empagliflozin-metFORMIN HCl ER Take 1 tablet by mouth 2 (two) times daily.   ticagrelor 90 MG Tabs tablet Commonly known as: BRILINTA TAKE 1 TABLET (90 MG TOTAL) BY MOUTH 2 (TWO) TIMES DAILY.   tiZANidine 4 MG tablet Commonly known as: ZANAFLEX Take 2-4 mg by mouth 2 (two) times daily as needed.        Allergies:  Allergies  Allergen Reactions   Ciprofloxacin Hives and Swelling   Lisinopril Hives, Itching and Swelling    Face swelling    Metformin And Related Diarrhea and Nausea And Vomiting    Past Medical History, Surgical history, Social history, and Family History were reviewed and updated.  Review of Systems: All other 10 point review of systems is negative.   Physical Exam:  vitals were not taken for this visit.   Wt Readings from Last 3 Encounters:  09/24/21 140 lb 1.9 oz (63.6 kg)  04/23/21 151 lb (68.5 kg)  03/26/21 150 lb (68 kg)    Ocular: Sclerae unicteric, pupils equal, round and reactive to light Ear-nose-throat: Oropharynx clear, dentition fair Lymphatic: No cervical or supraclavicular adenopathy Lungs no rales or rhonchi, good excursion bilaterally Heart regular rate and rhythm, no murmur appreciated Abd soft, nontender, positive bowel sounds MSK no focal spinal tenderness, no joint edema Neuro: non-focal, well-oriented, appropriate affect Breasts: Deferred   Lab Results  Component Value Date   WBC 5.0 08/24/2022   HGB 12.2 08/24/2022   HCT 37.6 08/24/2022   MCV 96.7 08/24/2022   PLT 223 08/24/2022   Lab Results  Component Value Date   FERRITIN 189 09/24/2021   IRON 126 09/24/2021   TIBC 319 09/24/2021   UIBC 193 09/24/2021   IRONPCTSAT 40 (H) 09/24/2021   Lab Results  Component Value Date   RETICCTPCT 1.1 08/24/2022   RBC 3.89 08/24/2022   RBC 3.94 08/24/2022   No results found for: "KPAFRELGTCHN", "LAMBDASER", "KAPLAMBRATIO" No results found for: "IGGSERUM", "IGA", "IGMSERUM" No results found for: "TOTALPROTELP",  "ALBUMINELP", "A1GS", "A2GS", "BETS", "BETA2SER", "GAMS", "MSPIKE", "SPEI"   Chemistry      Component Value Date/Time   NA 139 03/14/2019 1342   NA 146 (H) 01/18/2017 1310   K 5.6 (H) 03/14/2019 1342   K 4.8 (H) 01/18/2017 1310   CL 106 03/14/2019 1342   CL 107 01/18/2017 1310   CO2 27 03/14/2019 1342   CO2 27 01/18/2017 1310   BUN 21 (H) 03/14/2019 1342   BUN 21 01/18/2017 1310   CREATININE 1.41 (H) 03/14/2019 1342   CREATININE 1.4 (H) 01/18/2017 1310      Component Value Date/Time   CALCIUM 9.4 03/14/2019 1342   CALCIUM 9.2 01/18/2017 1310   ALKPHOS 139 (H) 03/13/2019 0855   ALKPHOS 100 (H) 01/18/2017 1310   AST 29 03/13/2019 0855   ALT 28 03/13/2019  0855   ALT 19 01/18/2017 1310   BILITOT 0.6 03/13/2019 0855       Impression and Plan: Kimberly Ramos is a very pleasant 52 yo African American female with iron deficiency anemia secondary to malabsorption after gastric bypass in 2018. Iron studies are pending. We will replace if needed.  Continue daily folic acid.  Follow-up in 6 months.   Eileen Stanford, NP 4/29/20242:00 PM

## 2022-08-25 LAB — IRON AND IRON BINDING CAPACITY (CC-WL,HP ONLY)
Iron: 89 ug/dL (ref 28–170)
Saturation Ratios: 25 % (ref 10.4–31.8)
TIBC: 353 ug/dL (ref 250–450)
UIBC: 264 ug/dL (ref 148–442)

## 2022-09-02 ENCOUNTER — Ambulatory Visit (INDEPENDENT_AMBULATORY_CARE_PROVIDER_SITE_OTHER): Payer: 59 | Admitting: Internal Medicine

## 2022-09-02 ENCOUNTER — Other Ambulatory Visit: Payer: Self-pay

## 2022-09-02 ENCOUNTER — Encounter: Payer: Self-pay | Admitting: Internal Medicine

## 2022-09-02 VITALS — BP 126/74 | HR 83 | Temp 97.9°F | Resp 18 | Ht 64.0 in | Wt 140.6 lb

## 2022-09-02 DIAGNOSIS — J343 Hypertrophy of nasal turbinates: Secondary | ICD-10-CM

## 2022-09-02 DIAGNOSIS — J4520 Mild intermittent asthma, uncomplicated: Secondary | ICD-10-CM | POA: Diagnosis not present

## 2022-09-02 DIAGNOSIS — J301 Allergic rhinitis due to pollen: Secondary | ICD-10-CM

## 2022-09-02 MED ORDER — OLOPATADINE HCL 0.2 % OP SOLN: 1.0000 [drp] | Freq: Every day | OPHTHALMIC | 5 refills | Status: AC | PRN

## 2022-09-02 MED ORDER — LEVOCETIRIZINE DIHYDROCHLORIDE 5 MG PO TABS: 5.0000 mg | ORAL_TABLET | Freq: Every evening | ORAL | 5 refills | Status: AC

## 2022-09-02 MED ORDER — ALBUTEROL SULFATE HFA 108 (90 BASE) MCG/ACT IN AERS: 1.0000 | INHALATION_SPRAY | Freq: Four times a day (QID) | RESPIRATORY_TRACT | 1 refills | Status: DC | PRN

## 2022-09-02 MED ORDER — MOMETASONE FUROATE 50 MCG/ACT NA SUSP: 2.0000 | Freq: Every day | NASAL | 5 refills | Status: AC

## 2022-09-02 MED ORDER — AZELASTINE HCL 0.1 % NA SOLN: 1.0000 | Freq: Two times a day (BID) | NASAL | 5 refills | Status: AC

## 2022-09-02 NOTE — Patient Instructions (Addendum)
Kimberly Ramos Return in about 3 months (around 12/03/2022).    Mild Intermittent Asthma - Well controlled. Normal spirometry today.  - Maintenance inhaler: none - Rescue inhaler: Albuterol 2 puffs every 6 hours as needed for respiratory symptoms of  shortness of breath, or wheezing Asthma control goals:  Full participation in all desired activities (may need albuterol before activity) Albuterol use two times or less a week on average (not counting use with activity) Cough interfering with sleep two times or less a month Oral steroids no more than once a year No hospitalizations  Allergic Rhinitis: - Positive skin test 08/2022: trees, grasses, weeds - Avoidance measures discussed. - Use nasal saline rinses before nose sprays such as with Neilmed Sinus Rinse.  Use distilled water.   - Use Nasacort or Nasonex 2 sprays each nostril daily. Aim upward and outward. - Use Azelastine 1-2 sprays each nostril twice daily as needed. Aim upward and outward. - Use Xyzal 5mg  daily.  - For eyes, use Olopatadine or Ketotifen 1 eye drop daily as needed for itchy, watery eyes.  Available over the counter, if not covered by insurance.    ALLERGEN AVOIDANCE MEASURES  Pollen Avoidance Pollen levels are highest during the mid-day and afternoon.  Consider this when planning outdoor activities. Avoid being outside when the grass is being mowed, or wear a mask if the pollen-allergic person must be the one to mow the grass. Keep the windows closed to keep pollen outside of the home. Use an air conditioner to filter the air. Take a shower, wash hair, and change clothing after working or playing outdoors during pollen season.

## 2022-09-02 NOTE — Progress Notes (Signed)
NEW PATIENT  Date of Service/Encounter:  09/02/22  Consult requested by: Jackie Plum, MD   Subjective:   Kimberly Ramos (DOB: 01-10-1971) is a 52 y.o. female who presents to the clinic on 09/02/2022 with a chief complaint of No chief complaint on file. Marland Kitchen    History obtained from: chart review and patient.   Asthma:  Diagnosed in childhood.   It does well overall, followed by Pulm.  With flare up, she gets chest tightness and cough. rarey daytime symptoms in past month, none nighttime awakenings in past month Using rescue inhaler:  Limitations to daily activity: mild 1 ED visits/UC visits and 1 oral steroids in the past year 0 number of lifetime hospitalizations, 0 number of lifetime intubations.  Identified Triggers: respiratory illness Prior PFTs or spirometry: with pulm; no results available   Current regimen:  Maintenance: none Rescue: Albuterol 2 puffs q4-6 hrs PRN  Rhinitis:  Started around teenager.   Symptoms include:  stuffy ears, wet cough, nasal congestion, rhinorrhea, post nasal drainage, and itchy eyes  Occurs year-round with seasonal flares Spring/Summer  Potential triggers: pollen  Treatments tried:  Xyzal 5mg  nightly; Sunday night  Pataday PRN Nasonex or Nasacort PRN  Previous allergy testing: 2019; positive to several things but can't recall exact results  History of sinus surgery: no Nonallergic triggers: none   Past Medical History: Past Medical History:  Diagnosis Date   Arthritis    Asthma    CAD S/P percutaneous coronary angioplasty 06/2012   2 vessel disease old sub totaled occ. of RCA and subtotal occ. of LAD   Diabetes mellitus without complication (HCC)    GERD (gastroesophageal reflux disease)    H/O hiatal hernia    Hypertension    Morbid obesity with BMI of 45.0-49.9, adult (HCC)    NSTEMI (non-ST elevated myocardial infarction) (HCC) 06/2012; Jan 2017   OSA on CPAP    Presence of drug coated stent in LAD coronary artery  06/2102; Jan 2017   a. for chronic subtotal LAD occlusion placed 06/2012; b. NSTEMI - PCI LAD-D1 bifurcation [facility])   Stroke [facility])    Tobacco use disorder 07/07/2012   Past Surgical History: Past Surgical History:  Procedure Laterality Date   CARDIAC CATHETERIZATION  07/05/2012   07/05/12  + CAD   CARDIAC CATHETERIZATION  2010   RCA stenosis with collaterals   CARDIAC CATHETERIZATION  Apr 30, 2015   [facility] - Severe LAD stenosis @ prox edge of stent --> PCI   carpel tunnel     LEFT HEART CATHETERIZATION WITH CORONARY ANGIOGRAM N/A 07/05/2012   Procedure: LEFT HEART CATHETERIZATION WITH CORONARY ANGIOGRAM;  Surgeon: Lennette Bihari, MD;  Location: [facility] CATH LAB;  Service: Cardiovascular;  LAD Subtototalled after D1 (not noticed in 2010, but present), RCA CTO.   MYOCARDIAL PERFUSION IMAGING  07/15/2012   EF 57% lge area ant and septal ischemia   PERCUTANEOUS CORONARY STENT INTERVENTION (PCI-S) N/A 07/20/2012   Procedure: PERCUTANEOUS CORONARY STENT INTERVENTION (PCI-S);  Surgeon: Marykay Lex, MD;  Location: [facility] CATH LAB;  PCI of LAD Promus Premier 2.25 mm x 20 mm ( 2.65 mm prox)   PERCUTANEOUS CORONARY STENT INTERVENTION (PCI-S)  May 01, 2015   [facility] - PCI of LAD (prox of prior stent), & ? D1. (DES)     Family History: Family History  Problem Relation Age of Onset   Diabetes Mother    Diabetes Father    Heart attack Maternal Grandmother  Diabetes Maternal Grandmother     Social History:  Lives in a 60 year house Flooring in bedroom: carpet Pets: dog Tobacco use/exposure: former smoker quit 2014  Job: disabled   Medication List:  Allergies as of 09/02/2022       Reactions   Ciprofloxacin Hives, Swelling   Lisinopril Hives, Itching, Swelling   Face swelling   Metformin And Related Diarrhea, Nausea And Vomiting        Medication List        Accurate as of Sep 02, 2022  3:38 PM. If you have any questions, ask your nurse or doctor.          albuterol 108 (90  Base) MCG/ACT inhaler Commonly known as: VENTOLIN HFA Inhale into the lungs.   albuterol 1.25 MG/3ML nebulizer solution Commonly known as: ACCUNEB PLEASE SEE ATTACHED FOR DETAILED DIRECTIONS   apixaban 5 MG Tabs tablet Commonly known as: ELIQUIS Take 5 mg by mouth 2 (two) times daily.   apixaban 5 MG Tabs tablet Commonly known as: ELIQUIS Take 1 tablet by mouth 2 (two) times daily.   atorvastatin 80 MG tablet Commonly known as: LIPITOR Take 80 mg by mouth at bedtime.   benzonatate 200 MG capsule Commonly known as: TESSALON Take 1 capsule (200 mg total) by mouth 3 (three) times daily as needed for cough.   carvedilol 6.25 MG tablet Commonly known as: COREG Take 6.25 mg by mouth 2 (two) times daily.   digoxin 0.125 MG tablet Commonly known as: LANOXIN Take 125 mcg by mouth every other day.   EPINEPHrine 0.3 mg/0.3 mL Soaj injection Commonly known as: EPI-PEN Use as directed for severe allergic reaction.   esomeprazole 40 MG capsule Commonly known as: NEXIUM TAKE 1 CAPSULE TWICE DAILY 30 MINUTES PRIOR TO BREAKFAST AND DINNER.   ferrous sulfate 325 (65 FE) MG EC tablet Take 325 mg by mouth daily.   Fifty50 Glucose Meter 2.0 w/Device Kit See admin instructions.   fluticasone 44 MCG/ACT inhaler Commonly known as: FLOVENT HFA INHALE 2 PUFFS EVERY DAY   fluticasone 50 MCG/ACT nasal spray Commonly known as: FLONASE Place into the nose.   folic acid 1 MG tablet Commonly known as: FOLVITE Take 2 tablets (2 mg total) by mouth daily.   furosemide 40 MG tablet Commonly known as: LASIX Take 40 mg by mouth as needed.   gabapentin 300 MG capsule Commonly known as: NEURONTIN Take 600 mg by mouth at bedtime.   glipiZIDE 5 MG 24 hr tablet Commonly known as: GLUCOTROL XL Take by mouth.   hydrALAZINE 10 MG tablet Commonly known as: APRESOLINE Take by mouth.   levocetirizine 5 MG tablet Commonly known as: XYZAL Take by mouth.   losartan 50 MG tablet Commonly  known as: COZAAR   losartan 100 MG tablet Commonly known as: COZAAR Take 100 mg by mouth daily.   magnesium oxide 400 MG tablet Commonly known as: MAG-OX Take by mouth.   metoprolol succinate 25 MG 24 hr tablet Commonly known as: TOPROL-XL Take 25 mg by mouth daily.   Multi-Vitamin tablet Take 1 tablet by mouth daily.   nitroGLYCERIN 0.4 MG SL tablet Commonly known as: NITROSTAT Place under the tongue.   olopatadine 0.1 % ophthalmic solution Commonly known as: PATANOL 1 drop 2 times daily.   sertraline 100 MG tablet Commonly known as: ZOLOFT Take 100 mg by mouth daily.   spironolactone 25 MG tablet Commonly known as: ALDACTONE Take 12.5 mg by mouth daily.   Synjardy XR  12.08-998 MG Tb24 Generic drug: Empagliflozin-metFORMIN HCl ER Take 1 tablet by mouth 2 (two) times daily.   ticagrelor 90 MG Tabs tablet Commonly known as: BRILINTA TAKE 1 TABLET (90 MG TOTAL) BY MOUTH 2 (TWO) TIMES DAILY.   tiZANidine 4 MG tablet Commonly known as: ZANAFLEX Take 2-4 mg by mouth 2 (two) times daily as needed.         REVIEW OF SYSTEMS: Pertinent positives and negatives discussed in HPI.   Objective:   Physical Exam: BP 126/74 (BP Location: Left Arm, Patient Position: Sitting, Cuff Size: Normal)   Pulse 83   Temp 97.9 F (36.6 C) (Temporal)   Resp 18   Ht 5\' 4"  (1.626 m)   Wt 140 lb 9.6 oz (63.8 kg)   SpO2 99%   BMI 24.13 kg/m  Body mass index is 24.13 kg/m. GEN: alert, well developed HEENT: clear conjunctiva, TM grey and translucent, nose with + inferior turbinate hypertrophy, pink nasal mucosa, slight clear rhinorrhea, no cobblestoning HEART: regular rate and rhythm, no murmur LUNGS: clear to auscultation bilaterally, no coughing, unlabored respiration ABDOMEN: soft, non distended  SKIN: no rashes or lesions  Reviewed:  08/18/2022: seen by Dr. Sullivan Lone for CAD hx of STEMI and late stent thrombosis on Eliquis/Brilinta, CHF with EF 25-30% with ICD for primary  prevention, DM, HTN, CVA, elevated homocysteine, iron deficiency anemia, carotid/subclavian stenosis.  On beta blocker and GDMT for CHF.   08/20/2022: seen by GI Izaj PA for further eval of abdominal pain and elevated LFT.  Also with GERD.  EGD with duodenitis.  Colonoscopy with diverticular disease. On Pepcid 40mg  daily pRN and Esomeprazole 40mg  BID. Plan to repeat EGD with possible dilation and evaluate gastric sleeve.    11/12/2016: saw Dr. Nunzio Cobbs for persistent asthma and allergic rhinoconjunctivitis. On Xyzal, Azelastine, saline spray, Pazeo, Pulmicort Flexhaler and PRN albuterol. Normal spirometry.   Spirometry:  Tracings reviewed. Her effort: Good reproducible efforts. FVC: 3.03L FEV1: 2.52L, 93% predicted FEV1/FVC ratio: 83% Interpretation: Spirometry consistent with normal pattern.  Please see scanned spirometry results for details.  Skin Testing:  Skin prick testing was placed, which includes aeroallergens/foods, histamine control, and saline control.  Verbal consent was obtained prior to placing test.  Patient tolerated procedure well.  Allergy testing results were read and interpreted by myself, documented by clinical staff. Adequate positive and negative control.  Results discussed with patient/family.  Airborne Adult Perc - 09/02/22 1443     Time Antigen Placed 0245    Allergen Manufacturer Waynette Buttery    Location Back    Number of Test 59    1. Control-Buffer 50% Glycerol Negative    2. Control-Histamine 1 mg/ml 3+    3. Albumin saline Negative    4. Bahia 2+    5. French Southern Territories Negative    6. Johnson Negative    7. Kentucky Blue Negative    8. Meadow Fescue Negative    9. Perennial Rye Negative    10. Sweet Vernal Negative    11. Timothy Negative    12. Cocklebur Negative    13. Burweed Marshelder 2+    14. Ragweed, short Negative    15. Ragweed, Giant Negative    16. Plantain,  English Negative    17. Lamb's Quarters Negative    18. Sheep Sorrell Negative    19. Rough  Pigweed Negative    20. Marsh Elder, Rough Negative    21. Mugwort, Common Negative    22. Ash mix Negative    23.  Birch mix Negative    24. Beech American 2+    25. Box, Elder Negative    26. Cedar, red Negative    27. Cottonwood, Guinea-Bissau Negative    28. Elm mix Negative    29. Hickory Negative    30. Maple mix 3+    31. Oak, Guinea-Bissau mix 2+    32. Pecan Pollen 2+    33. Pine mix Negative    34. Sycamore Eastern Negative    35. Walnut, Black Pollen Negative    36. Alternaria alternata Negative    37. Cladosporium Herbarum Negative    38. Aspergillus mix Negative    39. Penicillium mix Negative    40. Bipolaris sorokiniana (Helminthosporium) Negative    41. Drechslera spicifera (Curvularia) Negative    42. Mucor plumbeus Negative    43. Fusarium moniliforme Negative    44. Aureobasidium pullulans (pullulara) Negative    45. Rhizopus oryzae Negative    46. Botrytis cinera Negative    47. Epicoccum nigrum Negative    48. Phoma betae Negative    49. Candida Albicans Negative    50. Trichophyton mentagrophytes Negative    51. Mite, D Farinae  5,000 AU/ml Negative    52. Mite, D Pteronyssinus  5,000 AU/ml Negative    53. Cat Hair 10,000 BAU/ml Negative    54.  Dog Epithelia Negative    55. Mixed Feathers Negative    56. Horse Epithelia Negative    57. Cockroach, German Negative    58. Mouse Negative    59. Tobacco Leaf Negative               Assessment:   1. Mild intermittent asthma without complication   2. Nasal turbinate hypertrophy   3. Seasonal allergic rhinitis due to pollen     Plan/Recommendations:  Mild Intermittent Asthma - Well controlled. Normal spirometry today.  - Maintenance inhaler: none - Rescue inhaler: Albuterol 2 puffs every 6 hours as needed for respiratory symptoms of  shortness of breath, or wheezing Asthma control goals:  Full participation in all desired activities (may need albuterol before activity) Albuterol use two times or less a  week on average (not counting use with activity) Cough interfering with sleep two times or less a month Oral steroids no more than once a year No hospitalizations  Allergic Rhinitis: - Due to turbinate hypertrophy, seasonal flares and unresponsive to OTC meds, performed skin testing to identify aeroallergen triggers.   - Positive skin test 08/2022: trees, grasses, weeds - Avoidance measures discussed. - Use nasal saline rinses before nose sprays such as with Neilmed Sinus Rinse.  Use distilled water.   - Use Nasacort or Nasonex 2 sprays each nostril daily. Aim upward and outward. - Use Azelastine 1-2 sprays each nostril twice daily as needed. Aim upward and outward. - Use Xyzal 5mg  daily.  - For eyes, use Olopatadine or Ketotifen 1 eye drop daily as needed for itchy, watery eyes.  Available over the counter, if not covered by insurance.    Of note, patient has significant medical history and is not a immunotherapy candidate due to her high risk for severity of reaction- CAD hx of STEMI and late stent thrombosis on Eliquis/Brilinta, CHF with EF 25-30% with ICD for primary prevention, CVA, elevated homocysteine, carotid/subclavian stenosis.  On beta blocker and multiagent GDMT for CHF.    Return in about 3 months (around 12/03/2022).  Alesia Morin, MD [facility]

## 2022-09-02 NOTE — Addendum Note (Signed)
Addended by: Vincent Peyer A on: 09/02/2022 05:16 PM   Modules accepted: Orders

## 2022-10-26 ENCOUNTER — Other Ambulatory Visit: Payer: Self-pay | Admitting: Internal Medicine

## 2022-12-02 ENCOUNTER — Ambulatory Visit (INDEPENDENT_AMBULATORY_CARE_PROVIDER_SITE_OTHER): Payer: 59 | Admitting: Internal Medicine

## 2022-12-02 ENCOUNTER — Ambulatory Visit: Payer: 59 | Admitting: Internal Medicine

## 2022-12-02 ENCOUNTER — Encounter: Payer: Self-pay | Admitting: Internal Medicine

## 2022-12-02 VITALS — BP 122/78 | HR 72 | Temp 98.2°F | Resp 17

## 2022-12-02 DIAGNOSIS — J301 Allergic rhinitis due to pollen: Secondary | ICD-10-CM | POA: Diagnosis not present

## 2022-12-02 DIAGNOSIS — J4520 Mild intermittent asthma, uncomplicated: Secondary | ICD-10-CM | POA: Diagnosis not present

## 2022-12-02 DIAGNOSIS — J343 Hypertrophy of nasal turbinates: Secondary | ICD-10-CM

## 2022-12-02 NOTE — Progress Notes (Signed)
Follow Up Note  RE: Kimberly Ramos MRN: [id number] DOB: May 16, 1970 Date of Office Visit: 12/02/2022  Referring provider: Jackie Plum, MD Primary care provider: Jackie Plum, MD  Chief Complaint: Follow-up and Asthma  History of Present Illness: I had the pleasure of seeing Kimberly Ramos for a follow up visit at the [facility] on 12/02/2022. She is a 52 y.o. female, who is being followed for asthma and rhinitis . Her previous allergy office visit was on 09/02/22 with  Dr. Allena Katz  . Today is a regular follow up visit.  History obtained from patient, chart review.  Allergic Rhinitis: current therapy: astelin, nasocort, xyzal (take all daily),  symptoms improved symptoms include:  denies any breakthrough symptoms  Previous allergy testing:  08/2022: tree, grass, weed History of reflux/heartburn: no Interested in Allergy Immunotherapy: no  ASTHMA: mild intermittent  - Medical therapy: albuterol PRN  - Rescue inhaler use: 1-2 uses since last visit - Symptoms: denies any cough, wheezing, dyspnea - Exacerbation history: 0 ABX for respiratory illness since last visit, 0 OCS, 0ED, 0 UC visits in the past year  - ACT: 25 /25 - Adverse effects of medication: denies  - Previous FEV1: 2.52 L, 93% - Biologic Labs not done     Assessment and Plan: Kimberly Ramos is a 52 y.o. female with: Mild intermittent asthma without complication  Nasal turbinate hypertrophy  Seasonal allergic rhinitis due to pollen   Plan: Patient Instructions  Mild Intermittent Asthma - Well controlled. Breathing test looked great today! - Maintenance inhaler: none - Rescue inhaler: Albuterol 2 puffs every 6 hours as needed for respiratory symptoms of  shortness of breath, or wheezing Asthma control goals:  Full participation in all desired activities (may need albuterol before activity) Albuterol use two times or less a week on average (not counting use with activity) Cough interfering with  sleep two times or less a month Oral steroids no more than once a year No hospitalizations  Allergic Rhinitis: well controlled  - Recommend year round medical therapy as below  - Positive skin test 08/2022: trees, grasses, weeds - Continue avoidance measures  - Use nasal saline rinses before nose sprays such as with Neilmed Sinus Rinse.  Use distilled water.   - Use Nasacort or Nasonex 2 sprays each nostril daily. Aim upward and outward. - Use Azelastine 1-2 sprays each nostril twice daily as needed. Aim upward and outward. - Use Xyzal 5mg  daily.  - For eyes, use Olopatadine or Ketotifen 1 eye drop daily as needed for itchy, watery eyes.  Available over the counter, if not covered by insurance.    Follow up:  6 months   Thank you so much for letting me partake in your care today.  Don't hesitate to reach out if you have any additional concerns!  Ferol Luz, MD  [facility] [place] [place]    No orders of the defined types were placed in this encounter.   Lab Orders  No laboratory test(s) ordered today   Diagnostics: Spirometry:  Tracings reviewed. Her effort: Good reproducible efforts. FVC: 3.03L FEV1: 2.39L, 103% predicted FEV1/FVC ratio: 79% Interpretation: Spirometry consistent with normal pattern.  Please see scanned spirometry results for details.     Medication List:  Current Outpatient Medications  Medication Sig Dispense Refill   albuterol (ACCUNEB) 1.25 MG/3ML nebulizer solution PLEASE SEE ATTACHED FOR DETAILED DIRECTIONS  11   albuterol (VENTOLIN HFA) 108 (90 Base) MCG/ACT inhaler INHALE ONE TO TWO  PUFFS BY MOUTH EVERY 6 HOURS AS NEEDED FOR SHORTNESS OF BREATH OR WHEEZING 18 g 1   apixaban (ELIQUIS) 5 MG TABS tablet Take 5 mg by mouth 2 (two) times daily.     apixaban (ELIQUIS) 5 MG TABS tablet Take 1 tablet by mouth 2 (two) times daily.     atorvastatin (LIPITOR) 80 MG tablet Take 80 mg by mouth at bedtime.  1   azelastine (ASTELIN) 0.1  % nasal spray Place 1 spray into both nostrils 2 (two) times daily. Use in each nostril as directed 30 mL 5   benzonatate (TESSALON) 200 MG capsule Take 1 capsule (200 mg total) by mouth 3 (three) times daily as needed for cough. 21 capsule 0   Blood Glucose Monitoring Suppl (FIFTY50 GLUCOSE METER 2.0) w/Device KIT See admin instructions.     carvedilol (COREG) 6.25 MG tablet Take 6.25 mg by mouth 2 (two) times daily.  11   digoxin (LANOXIN) 0.125 MG tablet Take 125 mcg by mouth every other day.     EPINEPHrine 0.3 mg/0.3 mL IJ SOAJ injection Use as directed for severe allergic reaction. 2 Device 1   esomeprazole (NEXIUM) 40 MG capsule TAKE 1 CAPSULE TWICE DAILY 30 MINUTES PRIOR TO BREAKFAST AND DINNER.     ferrous sulfate 325 (65 FE) MG EC tablet Take 325 mg by mouth daily.     fluticasone (FLONASE) 50 MCG/ACT nasal spray Place into the nose.     fluticasone (FLOVENT HFA) 44 MCG/ACT inhaler INHALE 2 PUFFS EVERY DAY     folic acid (FOLVITE) 1 MG tablet Take 2 tablets (2 mg total) by mouth daily. 60 tablet 6   furosemide (LASIX) 40 MG tablet Take 40 mg by mouth as needed.     gabapentin (NEURONTIN) 300 MG capsule Take 600 mg by mouth at bedtime.     glipiZIDE (GLUCOTROL XL) 5 MG 24 hr tablet Take by mouth.     hydrALAZINE (APRESOLINE) 10 MG tablet Take by mouth.     levocetirizine (XYZAL) 5 MG tablet Take 1 tablet (5 mg total) by mouth every evening. 30 tablet 5   losartan (COZAAR) 100 MG tablet Take 100 mg by mouth daily.     losartan (COZAAR) 50 MG tablet      magnesium oxide (MAG-OX) 400 MG tablet Take by mouth.     metoprolol succinate (TOPROL-XL) 25 MG 24 hr tablet Take 25 mg by mouth daily.     mometasone (NASONEX) 50 MCG/ACT nasal spray Place 2 sprays into the nose daily. 1 each 5   Multiple Vitamin (MULTI-VITAMIN) tablet Take 1 tablet by mouth daily.     nitroGLYCERIN (NITROSTAT) 0.4 MG SL tablet Place under the tongue.     Olopatadine HCl 0.2 % SOLN Apply 1 drop to eye daily as needed  (itchy watery eyes). 2.5 mL 5   sertraline (ZOLOFT) 100 MG tablet Take 100 mg by mouth daily.     spironolactone (ALDACTONE) 25 MG tablet Take 12.5 mg by mouth daily.     SYNJARDY XR 12.08-998 MG TB24 Take 1 tablet by mouth 2 (two) times daily.     ticagrelor (BRILINTA) 90 MG TABS tablet TAKE 1 TABLET (90 MG TOTAL) BY MOUTH 2 (TWO) TIMES DAILY.     tiZANidine (ZANAFLEX) 4 MG tablet Take 2-4 mg by mouth 2 (two) times daily as needed.     Current Facility-Administered Medications  Medication Dose Route Frequency Provider Last Rate Last Admin   predniSONE (DELTASONE) tablet 10 mg  10 mg Oral Q breakfast Bobbitt, Heywood Iles, MD       Allergies: Allergies  Allergen Reactions   Ciprofloxacin Hives and Swelling   Lisinopril Hives, Itching and Swelling    Face swelling    Metformin And Related Diarrhea and Nausea And Vomiting   I reviewed her past medical history, social history, family history, and environmental history and no significant changes have been reported from her previous visit.  ROS: All others negative except as noted per HPI.   Objective: BP 122/78   Pulse 72   Temp 98.2 F (36.8 C) (Temporal)   Resp 17   SpO2 93%  There is no height or weight on file to calculate BMI. General Appearance:  Alert, cooperative, no distress, appears stated age  Head:  Normocephalic, without obvious abnormality, atraumatic  Eyes:  Conjunctiva clear, EOM's intact  Nose: Nares normal, hypertrophic turbinates, no visible anterior polyps, and septum midline  Throat: Lips, tongue normal; teeth and gums normal, normal posterior oropharynx  Neck: Supple, symmetrical  Lungs:   clear to auscultation bilaterally, Respirations unlabored, no coughing  Heart:  regular rate and rhythm and no murmur, Appears well perfused  Extremities: No edema  Skin: Skin color, texture, turgor normal, no rashes or lesions on visualized portions of skin  Neurologic: No gross deficits   Previous notes and tests were  reviewed. The plan was reviewed with the patient/family, and all questions/concerned were addressed.  It was my pleasure to see Kimberly Ramos today and participate in her care. Please feel free to contact me with any questions or concerns.  Sincerely,  Ferol Luz, MD  Allergy & Immunology  [facility] Office: [phone]

## 2022-12-02 NOTE — Patient Instructions (Addendum)
Mild Intermittent Asthma - Well controlled. Breathing test looked great today! - Maintenance inhaler: none - Rescue inhaler: Albuterol 2 puffs every 6 hours as needed for respiratory symptoms of  shortness of breath, or wheezing Asthma control goals:  Full participation in all desired activities (may need albuterol before activity) Albuterol use two times or less a week on average (not counting use with activity) Cough interfering with sleep two times or less a month Oral steroids no more than once a year No hospitalizations  Allergic Rhinitis: well controlled  - Recommend year round medical therapy as below  - Positive skin test 08/2022: trees, grasses, weeds - Continue avoidance measures  - Use nasal saline rinses before nose sprays such as with Neilmed Sinus Rinse.  Use distilled water.   - Use Nasacort or Nasonex 2 sprays each nostril daily. Aim upward and outward. - Use Azelastine 1-2 sprays each nostril twice daily as needed. Aim upward and outward. - Use Xyzal 5mg  daily.  - For eyes, use Olopatadine or Ketotifen 1 eye drop daily as needed for itchy, watery eyes.  Available over the counter, if not covered by insurance.    Follow up:  6 months   Thank you so much for letting me partake in your care today.  Don't hesitate to reach out if you have any additional concerns!  Ferol Luz, MD  [facility] [place] [place]

## 2022-12-07 ENCOUNTER — Other Ambulatory Visit: Payer: Self-pay

## 2022-12-07 ENCOUNTER — Encounter: Payer: Self-pay | Admitting: Family

## 2022-12-07 ENCOUNTER — Inpatient Hospital Stay: Payer: 59 | Attending: Hematology & Oncology

## 2022-12-07 ENCOUNTER — Inpatient Hospital Stay (HOSPITAL_BASED_OUTPATIENT_CLINIC_OR_DEPARTMENT_OTHER): Payer: 59 | Admitting: Family

## 2022-12-07 VITALS — BP 120/72 | HR 66 | Temp 97.7°F | Resp 18

## 2022-12-07 DIAGNOSIS — D509 Iron deficiency anemia, unspecified: Secondary | ICD-10-CM | POA: Diagnosis present

## 2022-12-07 DIAGNOSIS — K912 Postsurgical malabsorption, not elsewhere classified: Secondary | ICD-10-CM | POA: Insufficient documentation

## 2022-12-07 DIAGNOSIS — I214 Non-ST elevation (NSTEMI) myocardial infarction: Secondary | ICD-10-CM

## 2022-12-07 DIAGNOSIS — D508 Other iron deficiency anemias: Secondary | ICD-10-CM

## 2022-12-07 DIAGNOSIS — D500 Iron deficiency anemia secondary to blood loss (chronic): Secondary | ICD-10-CM

## 2022-12-07 DIAGNOSIS — Z9884 Bariatric surgery status: Secondary | ICD-10-CM | POA: Diagnosis not present

## 2022-12-07 LAB — CBC WITH DIFFERENTIAL (CANCER CENTER ONLY)
Abs Immature Granulocytes: 0.01 10*3/uL (ref 0.00–0.07)
Basophils Absolute: 0 10*3/uL (ref 0.0–0.1)
Basophils Relative: 1 %
Eosinophils Absolute: 0.2 10*3/uL (ref 0.0–0.5)
Eosinophils Relative: 3 %
HCT: 33.7 % — ABNORMAL LOW (ref 36.0–46.0)
Hemoglobin: 10.8 g/dL — ABNORMAL LOW (ref 12.0–15.0)
Immature Granulocytes: 0 %
Lymphocytes Relative: 20 %
Lymphs Abs: 1.2 10*3/uL (ref 0.7–4.0)
MCH: 31.4 pg (ref 26.0–34.0)
MCHC: 32 g/dL (ref 30.0–36.0)
MCV: 98 fL (ref 80.0–100.0)
Monocytes Absolute: 0.6 10*3/uL (ref 0.1–1.0)
Monocytes Relative: 10 %
Neutro Abs: 3.9 10*3/uL (ref 1.7–7.7)
Neutrophils Relative %: 66 %
Platelet Count: 223 10*3/uL (ref 150–400)
RBC: 3.44 MIL/uL — ABNORMAL LOW (ref 3.87–5.11)
RDW: 14.1 % (ref 11.5–15.5)
WBC Count: 5.9 10*3/uL (ref 4.0–10.5)
nRBC: 0 % (ref 0.0–0.2)

## 2022-12-07 LAB — RETICULOCYTES
Immature Retic Fract: 16.9 % — ABNORMAL HIGH (ref 2.3–15.9)
RBC.: 3.43 MIL/uL — ABNORMAL LOW (ref 3.87–5.11)
Retic Count, Absolute: 59.3 10*3/uL (ref 19.0–186.0)
Retic Ct Pct: 1.7 % (ref 0.4–3.1)

## 2022-12-07 LAB — FERRITIN: Ferritin: 82 ng/mL (ref 11–307)

## 2022-12-07 MED ORDER — FOLIC ACID 1 MG PO TABS: 2.0000 mg | ORAL_TABLET | Freq: Every day | ORAL | 6 refills | Status: AC

## 2022-12-07 NOTE — Progress Notes (Signed)
Hematology and Oncology Follow Up Visit  Kimberly Ramos [id number] April 16, 1971 52 y.o. 12/07/2022   Principle Diagnosis:  Iron deficiency anemia secondary to malabsorption (gastric sleeve 2018) Hyperhomocystinemia    Current Therapy:        IV iron as indicated  Folic acid 1 mg PO daily   Interim History:  [name] is here today for follow-up. She is doing well and has no complaints at this time.  She notes occasional episodes of fatigue as well as SOB with the summer heat.  No blood loss noted.  No bruising or petechiae.  No fever, chills, n/v, cough, rash, dizziness, chest pain, palpitations, abdominal pain or changes in bowel or bladder habits.  No tenderness, numbness or tingling in her extremities.  She states that the diuretics she takes helps keep off any fluid retention.  No falls or syncope reported.  Appetite and hydration are good. Weight is stable at 147 lbs.   ECOG Performance Status: 1 - Symptomatic but completely ambulatory  Medications:  Allergies as of 12/07/2022       Reactions   Ciprofloxacin Hives, Swelling   Lisinopril Hives, Itching, Swelling   Face swelling   Metformin And Related Diarrhea, Nausea And Vomiting        Medication List        Accurate as of December 07, 2022  2:56 PM. If you have any questions, ask your nurse or doctor.          albuterol 1.25 MG/3ML nebulizer solution Commonly known as: ACCUNEB PLEASE SEE ATTACHED FOR DETAILED DIRECTIONS   albuterol 108 (90 Base) MCG/ACT inhaler Commonly known as: VENTOLIN HFA INHALE ONE TO TWO PUFFS BY MOUTH EVERY 6 HOURS AS NEEDED FOR SHORTNESS OF BREATH OR WHEEZING   apixaban 5 MG Tabs tablet Commonly known as: ELIQUIS Take 5 mg by mouth 2 (two) times daily.   atorvastatin 80 MG tablet Commonly known as: LIPITOR Take 80 mg by mouth at bedtime.   azelastine 0.1 % nasal spray Commonly known as: ASTELIN Place 1 spray into both nostrils 2 (two) times daily. Use in each nostril as  directed   benzonatate 200 MG capsule Commonly known as: TESSALON Take 1 capsule (200 mg total) by mouth 3 (three) times daily as needed for cough.   digoxin 0.125 MG tablet Commonly known as: LANOXIN Take 125 mcg by mouth every other day.   EPINEPHrine 0.3 mg/0.3 mL Soaj injection Commonly known as: EPI-PEN Use as directed for severe allergic reaction.   esomeprazole 40 MG capsule Commonly known as: NEXIUM TAKE 1 CAPSULE TWICE DAILY 30 MINUTES PRIOR TO BREAKFAST AND DINNER.   ferrous sulfate 325 (65 FE) MG EC tablet Take 325 mg by mouth daily.   Fifty50 Glucose Meter 2.0 w/Device Kit See admin instructions.   fluticasone 44 MCG/ACT inhaler Commonly known as: FLOVENT HFA INHALE 2 PUFFS EVERY DAY   fluticasone 50 MCG/ACT nasal spray Commonly known as: FLONASE Place into the nose.   folic acid 1 MG tablet Commonly known as: FOLVITE Take 2 tablets (2 mg total) by mouth daily.   FreeStyle Libre 14 Day Reader Hardie Pulley Scan as needed for continuous glucose monitoring.   furosemide 40 MG tablet Commonly known as: LASIX Take 40 mg by mouth as needed.   gabapentin 300 MG capsule Commonly known as: NEURONTIN Take 600 mg by mouth at bedtime.   glipiZIDE 5 MG 24 hr tablet Commonly known as: GLUCOTROL XL Take by mouth.   hydrALAZINE 10 MG tablet  Commonly known as: APRESOLINE Take by mouth.   levocetirizine 5 MG tablet Commonly known as: XYZAL Take 1 tablet (5 mg total) by mouth every evening.   losartan 100 MG tablet Commonly known as: COZAAR Take 100 mg by mouth daily.   magnesium oxide 400 MG tablet Commonly known as: MAG-OX Take by mouth.   metoprolol succinate 25 MG 24 hr tablet Commonly known as: TOPROL-XL Take 25 mg by mouth daily.   mometasone 50 MCG/ACT nasal spray Commonly known as: Nasonex Place 2 sprays into the nose daily.   Multi-Vitamin tablet Take 1 tablet by mouth daily.   nitroGLYCERIN 0.4 MG SL tablet Commonly known as: NITROSTAT Place  under the tongue.   Olopatadine HCl 0.2 % Soln Apply 1 drop to eye daily as needed (itchy watery eyes).   sertraline 100 MG tablet Commonly known as: ZOLOFT Take 100 mg by mouth daily.   spironolactone 25 MG tablet Commonly known as: ALDACTONE Take 12.5 mg by mouth daily.   ticagrelor 90 MG Tabs tablet Commonly known as: BRILINTA TAKE 1 TABLET (90 MG TOTAL) BY MOUTH 2 (TWO) TIMES DAILY.   tiZANidine 4 MG tablet Commonly known as: ZANAFLEX Take 2-4 mg by mouth 2 (two) times daily as needed.        Allergies:  Allergies  Allergen Reactions   Ciprofloxacin Hives and Swelling   Lisinopril Hives, Itching and Swelling    Face swelling    Metformin And Related Diarrhea and Nausea And Vomiting    Past Medical History, Surgical history, Social history, and Family History were reviewed and updated.  Review of Systems: All other 10 point review of systems is negative.   Physical Exam:  vitals were not taken for this visit.   Wt Readings from Last 3 Encounters:  09/02/22 140 lb 9.6 oz (63.8 kg)  08/24/22 141 lb (64 kg)  09/24/21 140 lb 1.9 oz (63.6 kg)    Ocular: Sclerae unicteric, pupils equal, round and reactive to light Ear-nose-throat: Oropharynx clear, dentition fair Lymphatic: No cervical or supraclavicular adenopathy Lungs no rales or rhonchi, good excursion bilaterally Heart regular rate and rhythm, no murmur appreciated Abd soft, nontender, positive bowel sounds MSK no focal spinal tenderness, no joint edema Neuro: non-focal, well-oriented, appropriate affect Breasts: Deferred   Lab Results  Component Value Date   WBC 5.9 12/07/2022   HGB 10.8 (L) 12/07/2022   HCT 33.7 (L) 12/07/2022   MCV 98.0 12/07/2022   PLT 223 12/07/2022   Lab Results  Component Value Date   FERRITIN 59 08/24/2022   IRON 89 08/24/2022   TIBC 353 08/24/2022   UIBC 264 08/24/2022   IRONPCTSAT 25 08/24/2022   Lab Results  Component Value Date   RETICCTPCT 1.7 12/07/2022    RBC 3.43 (L) 12/07/2022   RBC 3.44 (L) 12/07/2022   No results found for: "KPAFRELGTCHN", "LAMBDASER", "KAPLAMBRATIO" No results found for: "IGGSERUM", "IGA", "IGMSERUM" No results found for: "TOTALPROTELP", "ALBUMINELP", "A1GS", "A2GS", "BETS", "BETA2SER", "GAMS", "MSPIKE", "SPEI"   Chemistry      Component Value Date/Time   NA 139 03/14/2019 1342   NA 146 (H) 01/18/2017 1310   K 5.6 (H) 03/14/2019 1342   K 4.8 (H) 01/18/2017 1310   CL 106 03/14/2019 1342   CL 107 01/18/2017 1310   CO2 27 03/14/2019 1342   CO2 27 01/18/2017 1310   BUN 21 (H) 03/14/2019 1342   BUN 21 01/18/2017 1310   CREATININE 1.41 (H) 03/14/2019 1342   CREATININE 1.4 (H) 01/18/2017  1310      Component Value Date/Time   CALCIUM 9.4 03/14/2019 1342   CALCIUM 9.2 01/18/2017 1310   ALKPHOS 139 (H) 03/13/2019 0855   ALKPHOS 100 (H) 01/18/2017 1310   AST 29 03/13/2019 0855   ALT 28 03/13/2019 0855   ALT 19 01/18/2017 1310   BILITOT 0.6 03/13/2019 0855       Impression and Plan: Ms. Sahlberg is a very pleasant 52 yo African American female with iron deficiency anemia secondary to malabsorption after gastric bypass in 2018. Iron studies are pending. We will replace if needed.  Continue daily folic acid.  Follow-up in 1 year.   Eileen Stanford, NP 8/12/20242:56 PM

## 2023-02-23 ENCOUNTER — Other Ambulatory Visit: Payer: 59

## 2023-02-23 ENCOUNTER — Ambulatory Visit: Payer: 59 | Admitting: Medical Oncology

## 2023-12-06 ENCOUNTER — Inpatient Hospital Stay: Payer: 59

## 2023-12-06 ENCOUNTER — Ambulatory Visit: Payer: 59 | Admitting: Family

## 2023-12-21 ENCOUNTER — Inpatient Hospital Stay: Admitting: Medical Oncology

## 2023-12-21 ENCOUNTER — Other Ambulatory Visit: Payer: Self-pay | Admitting: Family

## 2023-12-21 ENCOUNTER — Inpatient Hospital Stay: Attending: Hematology & Oncology

## 2023-12-21 DIAGNOSIS — D500 Iron deficiency anemia secondary to blood loss (chronic): Secondary | ICD-10-CM

## 2024-01-11 ENCOUNTER — Inpatient Hospital Stay: Admitting: Medical Oncology

## 2024-01-11 ENCOUNTER — Inpatient Hospital Stay: Attending: Hematology & Oncology

## 2024-01-11 DIAGNOSIS — K909 Intestinal malabsorption, unspecified: Secondary | ICD-10-CM | POA: Insufficient documentation

## 2024-01-11 DIAGNOSIS — D500 Iron deficiency anemia secondary to blood loss (chronic): Secondary | ICD-10-CM | POA: Insufficient documentation

## 2024-01-11 DIAGNOSIS — Z9884 Bariatric surgery status: Secondary | ICD-10-CM | POA: Insufficient documentation

## 2024-01-17 ENCOUNTER — Inpatient Hospital Stay (HOSPITAL_BASED_OUTPATIENT_CLINIC_OR_DEPARTMENT_OTHER): Admitting: Medical Oncology

## 2024-01-17 ENCOUNTER — Inpatient Hospital Stay

## 2024-01-17 ENCOUNTER — Encounter: Payer: Self-pay | Admitting: Medical Oncology

## 2024-01-17 VITALS — BP 106/63 | HR 61 | Temp 98.4°F | Resp 18 | Ht 66.0 in | Wt 145.4 lb

## 2024-01-17 DIAGNOSIS — K909 Intestinal malabsorption, unspecified: Secondary | ICD-10-CM | POA: Diagnosis present

## 2024-01-17 DIAGNOSIS — D500 Iron deficiency anemia secondary to blood loss (chronic): Secondary | ICD-10-CM

## 2024-01-17 DIAGNOSIS — Z9884 Bariatric surgery status: Secondary | ICD-10-CM | POA: Diagnosis not present

## 2024-01-17 DIAGNOSIS — D508 Other iron deficiency anemias: Secondary | ICD-10-CM | POA: Diagnosis not present

## 2024-01-17 LAB — RETICULOCYTES
Immature Retic Fract: 10 % (ref 2.3–15.9)
RBC.: 4.02 MIL/uL (ref 3.87–5.11)
Retic Count, Absolute: 59.5 K/uL (ref 19.0–186.0)
Retic Ct Pct: 1.5 % (ref 0.4–3.1)

## 2024-01-17 LAB — CBC WITH DIFFERENTIAL (CANCER CENTER ONLY)
Abs Immature Granulocytes: 0.01 K/uL (ref 0.00–0.07)
Basophils Absolute: 0 K/uL (ref 0.0–0.1)
Basophils Relative: 1 %
Eosinophils Absolute: 0.2 K/uL (ref 0.0–0.5)
Eosinophils Relative: 4 %
HCT: 35.7 % — ABNORMAL LOW (ref 36.0–46.0)
Hemoglobin: 11.9 g/dL — ABNORMAL LOW (ref 12.0–15.0)
Immature Granulocytes: 0 %
Lymphocytes Relative: 39 %
Lymphs Abs: 1.9 K/uL (ref 0.7–4.0)
MCH: 30 pg (ref 26.0–34.0)
MCHC: 33.3 g/dL (ref 30.0–36.0)
MCV: 89.9 fL (ref 80.0–100.0)
Monocytes Absolute: 0.5 K/uL (ref 0.1–1.0)
Monocytes Relative: 10 %
Neutro Abs: 2.2 K/uL (ref 1.7–7.7)
Neutrophils Relative %: 46 %
Platelet Count: 212 K/uL (ref 150–400)
RBC: 3.97 MIL/uL (ref 3.87–5.11)
RDW: 13.7 % (ref 11.5–15.5)
WBC Count: 4.8 K/uL (ref 4.0–10.5)
nRBC: 0 % (ref 0.0–0.2)

## 2024-01-17 LAB — FERRITIN: Ferritin: 43 ng/mL (ref 11–307)

## 2024-01-17 LAB — IRON AND IRON BINDING CAPACITY (CC-WL,HP ONLY)
Iron: 70 ug/dL (ref 28–170)
Saturation Ratios: 20 % (ref 10.4–31.8)
TIBC: 360 ug/dL (ref 250–450)
UIBC: 290 ug/dL

## 2024-01-17 NOTE — Progress Notes (Addendum)
 Hematology and Oncology Follow Up Visit  Kimberly Ramos [id number] December 18, 1970 53 y.o. 01/17/2024   Principle Diagnosis:  Iron  deficiency anemia secondary to malabsorption (gastric sleeve 2018) Hyperhomocystinemia    Current Therapy:        IV iron  as indicated - Had IV iron  back in 2021 Folic acid  1 mg PO daily   Interim History:  Kimberly Ramos is here today for follow-up.   She reports that she is doing well.  No blood loss noted.  No bruising or petechiae.  No fever, chills, n/v, cough, rash, dizziness, chest pain, palpitations, abdominal pain or changes in bowel or bladder habits.  No tenderness, numbness or tingling in her extremities.  She states that the diuretics she takes helps keep off any fluid retention.  No falls or syncope reported.  Appetite and hydration are good.  Wt Readings from Last 3 Encounters:  01/17/24 145 lb 6.4 oz (66 kg)  09/02/22 140 lb 9.6 oz (63.8 kg)  08/24/22 141 lb (64 kg)     ECOG Performance Status: 1 - Symptomatic but completely ambulatory  Medications:  Allergies as of 01/17/2024       Reactions   Ciprofloxacin Hives, Swelling   Lisinopril  Hives, Itching, Swelling   Face swelling   Metformin And Related Diarrhea, Nausea And Vomiting        Medication List        Accurate as of January 17, 2024  3:48 PM. If you have any questions, ask your nurse or doctor.          STOP taking these medications    FreeStyle Libre 14 Day Reader Kimberly Ramos by: Kimberly Ramos       TAKE these medications    Accu-Chek Guide Test test strip Generic drug: glucose blood 1 each by Other route as needed for other.   Accu-Chek Softclix Lancets lancets 1 each by Other route daily.   acetaminophen  500 MG tablet Commonly known as: TYLENOL  Take 1,000 mg by mouth 2 (two) times daily as needed.   albuterol  1.25 MG/3ML nebulizer solution Commonly known as: ACCUNEB  PLEASE SEE ATTACHED FOR DETAILED DIRECTIONS   albuterol  108 (90 Base)  MCG/ACT inhaler Commonly known as: VENTOLIN  HFA INHALE ONE TO TWO PUFFS BY MOUTH EVERY 6 HOURS AS NEEDED FOR SHORTNESS OF BREATH OR WHEEZING   apixaban 5 MG Tabs tablet Commonly known as: ELIQUIS Take 5 mg by mouth 2 (two) times daily.   atorvastatin  80 MG tablet Commonly known as: LIPITOR Take 80 mg by mouth at bedtime.   azelastine  0.1 % nasal spray Commonly known as: ASTELIN  Place 1 spray into both nostrils 2 (two) times daily. Use in each nostril as directed   benzonatate  200 MG capsule Commonly known as: TESSALON  Take 1 capsule (200 mg total) by mouth 3 (three) times daily as needed for cough.   digoxin 0.125 MG tablet Commonly known as: LANOXIN Take 125 mcg by mouth every other day.   EPINEPHrine  0.3 mg/0.3 mL Soaj injection Commonly known as: EPI-PEN Use as directed for severe allergic reaction.   esomeprazole 40 MG capsule Commonly known as: NEXIUM TAKE 1 CAPSULE TWICE DAILY 30 MINUTES PRIOR TO BREAKFAST AND DINNER.   ezetimibe  10 MG tablet Commonly known as: ZETIA  Take 10 mg by mouth daily.   ferrous sulfate 325 (65 FE) MG EC tablet Take 325 mg by mouth daily.   Fifty50 Glucose Meter 2.0 w/Device Kit See admin instructions.   fluticasone  44 MCG/ACT inhaler Commonly known as:  FLOVENT  HFA INHALE 2 PUFFS EVERY DAY   fluticasone  50 MCG/ACT nasal spray Commonly known as: FLONASE  Place into the nose.   folic acid  1 MG tablet Commonly known as: FOLVITE  Take 2 tablets (2 mg total) by mouth daily.   furosemide  40 MG tablet Commonly known as: LASIX  Take 40 mg by mouth as needed.   gabapentin 300 MG capsule Commonly known as: NEURONTIN Take 600 mg by mouth at bedtime.   glipiZIDE 5 MG 24 hr tablet Commonly known as: GLUCOTROL XL Take by mouth.   hydrALAZINE 10 MG tablet Commonly known as: APRESOLINE Take by mouth.   isosorbide  mononitrate 30 MG 24 hr tablet Commonly known as: IMDUR  Take 30 mg by mouth daily.   Jardiance 25 MG Tabs  tablet Generic drug: empagliflozin Take 25 mg by mouth daily.   Lantus SoloStar 100 UNIT/ML Solostar Pen Generic drug: insulin  glargine Inject 10 Units into the skin daily.   levocetirizine 5 MG tablet Commonly known as: XYZAL  Take 1 tablet (5 mg total) by mouth every evening.   losartan 100 MG tablet Commonly known as: COZAAR Take 100 mg by mouth daily.   magnesium oxide 400 MG tablet Commonly known as: MAG-OX Take by mouth.   metoprolol  succinate 25 MG 24 hr tablet Commonly known as: TOPROL -XL Take 25 mg by mouth daily.   mometasone  50 MCG/ACT nasal spray Commonly known as: Nasonex  Place 2 sprays into the nose daily.   Multi-Vitamin tablet Take 1 tablet by mouth daily.   nitroGLYCERIN  0.4 MG SL tablet Commonly known as: NITROSTAT  Place under the tongue.   Olopatadine  HCl 0.2 % Soln Apply 1 drop to eye daily as needed (itchy watery eyes).   Repatha SureClick 140 MG/ML Soaj Generic drug: Evolocumab Inject 140 mg into the skin every 14 (fourteen) days.   sertraline 100 MG tablet Commonly known as: ZOLOFT Take 100 mg by mouth daily.   sodium zirconium cyclosilicate 5 g packet Commonly known as: LOKELMA Take 5 g by mouth once a week.   spironolactone 25 MG tablet Commonly known as: ALDACTONE Take 12.5 mg by mouth daily.   ticagrelor  90 MG Tabs tablet Commonly known as: BRILINTA  TAKE 1 TABLET (90 MG TOTAL) BY MOUTH 2 (TWO) TIMES DAILY.   tiZANidine 4 MG tablet Commonly known as: ZANAFLEX Take 2-4 mg by mouth 2 (two) times daily as needed.   triamcinolone cream 0.1 % Commonly known as: KENALOG Apply 1 Application topically 3 (three) times daily.        Allergies:  Allergies  Allergen Reactions   Ciprofloxacin Hives and Swelling   Lisinopril  Hives, Itching and Swelling    Face swelling    Metformin And Related Diarrhea and Nausea And Vomiting    Past Medical History, Surgical history, Social history, and Family History were reviewed and  updated.  Review of Systems: All other 10 point review of systems is negative.   Physical Exam:  height is 5' 6 (1.676 m) and weight is 145 lb 6.4 oz (66 kg). Her oral temperature is 98.4 F (36.9 C). Her blood pressure is 106/63 and her pulse is 61. Her respiration is 18 and oxygen saturation is 100%.   Wt Readings from Last 3 Encounters:  01/17/24 145 lb 6.4 oz (66 kg)  09/02/22 140 lb 9.6 oz (63.8 kg)  08/24/22 141 lb (64 kg)    Ocular: Sclerae unicteric, pupils equal, round and reactive to light Ear-nose-throat: Oropharynx clear, dentition fair Lymphatic: No cervical or supraclavicular adenopathy Lungs no  rales or rhonchi, good excursion bilaterally Heart regular rate and rhythm, no murmur appreciated Abd soft, nontender, positive bowel sounds MSK no focal spinal tenderness, no joint edema Neuro: non-focal, well-oriented, appropriate affect   Lab Results  Component Value Date   WBC 4.8 01/17/2024   HGB 11.9 (L) 01/17/2024   HCT 35.7 (L) 01/17/2024   MCV 89.9 01/17/2024   PLT 212 01/17/2024   Lab Results  Component Value Date   FERRITIN 82 12/07/2022   IRON  63 12/07/2022   TIBC 332 12/07/2022   UIBC 269 12/07/2022   IRONPCTSAT 19 12/07/2022   Lab Results  Component Value Date   RETICCTPCT 1.5 01/17/2024   RBC 4.02 01/17/2024   No results found for: KPAFRELGTCHN, LAMBDASER, KAPLAMBRATIO No results found for: IGGSERUM, IGA, IGMSERUM No results found for: STEPHANY CARLOTA BENSON MARKEL EARLA JOANNIE DOC VICK, SPEI   Chemistry      Component Value Date/Time   NA 139 03/14/2019 1342   NA 146 (H) 01/18/2017 1310   K 5.6 (H) 03/14/2019 1342   K 4.8 (H) 01/18/2017 1310   CL 106 03/14/2019 1342   CL 107 01/18/2017 1310   CO2 27 03/14/2019 1342   CO2 27 01/18/2017 1310   BUN 21 (H) 03/14/2019 1342   BUN 21 01/18/2017 1310   CREATININE 1.41 (H) 03/14/2019 1342   CREATININE 1.4 (H) 01/18/2017 1310      Component Value  Date/Time   CALCIUM  9.4 03/14/2019 1342   CALCIUM  9.2 01/18/2017 1310   ALKPHOS 139 (H) 03/13/2019 0855   ALKPHOS 100 (H) 01/18/2017 1310   AST 29 03/13/2019 0855   ALT 28 03/13/2019 0855   ALT 19 01/18/2017 1310   BILITOT 0.6 03/13/2019 0855     Encounter Diagnoses  Name Primary?   Bariatric surgery status    Iron  deficiency anemia secondary to inadequate dietary iron  intake Yes    Impression and Plan: Kimberly Ramos is a very pleasant 53 yo [place] American female with iron  deficiency anemia secondary to malabsorption after gastric bypass in 2018.  CBC with a Hgb of 11.9 Iron  studies are pending. We will replace if needed.  Continue daily folic acid .   RTC 1 year APP, labs (CBC, iron , ferritin)  Kimberly Ramos Dais, PA-C 9/22/20253:48 PM

## 2025-01-16 ENCOUNTER — Ambulatory Visit: Admitting: Family

## 2025-01-16 ENCOUNTER — Inpatient Hospital Stay
